# Patient Record
Sex: Female | Born: 1965 | Race: Black or African American | Hispanic: No | Smoking: Never smoker
Health system: Southern US, Community
[De-identification: ages and names within clinical notes are randomized; demographics above are authoritative.]

## PROBLEM LIST (undated history)

## (undated) DIAGNOSIS — T7840XA Allergy, unspecified, initial encounter: Secondary | ICD-10-CM

## (undated) DIAGNOSIS — R112 Nausea with vomiting, unspecified: Secondary | ICD-10-CM

## (undated) DIAGNOSIS — G43909 Migraine, unspecified, not intractable, without status migrainosus: Secondary | ICD-10-CM

## (undated) DIAGNOSIS — Z9889 Other specified postprocedural states: Secondary | ICD-10-CM

## (undated) DIAGNOSIS — D573 Sickle-cell trait: Secondary | ICD-10-CM

## (undated) DIAGNOSIS — K449 Diaphragmatic hernia without obstruction or gangrene: Secondary | ICD-10-CM

## (undated) DIAGNOSIS — K219 Gastro-esophageal reflux disease without esophagitis: Secondary | ICD-10-CM

## (undated) DIAGNOSIS — D649 Anemia, unspecified: Secondary | ICD-10-CM

## (undated) DIAGNOSIS — IMO0002 Reserved for concepts with insufficient information to code with codable children: Secondary | ICD-10-CM

## (undated) HISTORY — PX: SMALL INTESTINE SURGERY: SHX150

## (undated) HISTORY — PX: HERNIA REPAIR: SHX51

## (undated) HISTORY — DX: Reserved for concepts with insufficient information to code with codable children: IMO0002

## (undated) HISTORY — DX: Allergy, unspecified, initial encounter: T78.40XA

## (undated) HISTORY — DX: Migraine, unspecified, not intractable, without status migrainosus: G43.909

## (undated) HISTORY — DX: Diaphragmatic hernia without obstruction or gangrene: K44.9

## (undated) HISTORY — PX: COLONOSCOPY: SHX174

## (undated) HISTORY — DX: Anemia, unspecified: D64.9

## (undated) HISTORY — PX: UPPER GI ENDOSCOPY: SHX6162

## (undated) HISTORY — PX: SALPINGECTOMY: SHX328

---

## 1995-06-26 HISTORY — PX: CHOLECYSTECTOMY: SHX55

## 1997-08-18 ENCOUNTER — Ambulatory Visit (HOSPITAL_COMMUNITY): Admission: RE | Admit: 1997-08-18 | Discharge: 1997-08-18 | Payer: Self-pay | Admitting: Obstetrics and Gynecology

## 1997-09-21 ENCOUNTER — Inpatient Hospital Stay (HOSPITAL_COMMUNITY): Admission: AD | Admit: 1997-09-21 | Discharge: 1997-09-24 | Payer: Self-pay | Admitting: Obstetrics and Gynecology

## 1997-10-08 ENCOUNTER — Inpatient Hospital Stay (HOSPITAL_COMMUNITY): Admission: AD | Admit: 1997-10-08 | Discharge: 1997-10-11 | Payer: Self-pay

## 1997-10-12 ENCOUNTER — Encounter (HOSPITAL_COMMUNITY): Admission: RE | Admit: 1997-10-12 | Discharge: 1998-01-10 | Payer: Self-pay

## 1999-02-01 ENCOUNTER — Ambulatory Visit (HOSPITAL_COMMUNITY): Admission: AD | Admit: 1999-02-01 | Discharge: 1999-02-01 | Payer: Self-pay | Admitting: *Deleted

## 1999-02-01 ENCOUNTER — Encounter: Payer: Self-pay | Admitting: *Deleted

## 1999-02-01 ENCOUNTER — Encounter (INDEPENDENT_AMBULATORY_CARE_PROVIDER_SITE_OTHER): Payer: Self-pay | Admitting: Specialist

## 1999-06-26 HISTORY — PX: KNEE SURGERY: SHX244

## 1999-07-28 ENCOUNTER — Emergency Department (HOSPITAL_COMMUNITY): Admission: EM | Admit: 1999-07-28 | Discharge: 1999-07-28 | Payer: Self-pay | Admitting: *Deleted

## 2000-04-03 ENCOUNTER — Other Ambulatory Visit: Admission: RE | Admit: 2000-04-03 | Discharge: 2000-04-03 | Payer: Self-pay | Admitting: Obstetrics & Gynecology

## 2000-06-25 HISTORY — PX: TUBAL LIGATION: SHX77

## 2000-08-20 ENCOUNTER — Ambulatory Visit (HOSPITAL_BASED_OUTPATIENT_CLINIC_OR_DEPARTMENT_OTHER): Admission: RE | Admit: 2000-08-20 | Discharge: 2000-08-20 | Payer: Self-pay | Admitting: Orthopaedic Surgery

## 2001-11-28 ENCOUNTER — Encounter: Payer: Self-pay | Admitting: Obstetrics and Gynecology

## 2001-11-28 ENCOUNTER — Ambulatory Visit (HOSPITAL_COMMUNITY): Admission: RE | Admit: 2001-11-28 | Discharge: 2001-11-28 | Payer: Self-pay | Admitting: Obstetrics and Gynecology

## 2002-06-08 ENCOUNTER — Encounter: Payer: Self-pay | Admitting: Emergency Medicine

## 2002-06-08 ENCOUNTER — Emergency Department (HOSPITAL_COMMUNITY): Admission: EM | Admit: 2002-06-08 | Discharge: 2002-06-08 | Payer: Self-pay | Admitting: Emergency Medicine

## 2003-11-03 ENCOUNTER — Other Ambulatory Visit: Admission: RE | Admit: 2003-11-03 | Discharge: 2003-11-03 | Payer: Self-pay | Admitting: Obstetrics and Gynecology

## 2004-06-25 HISTORY — PX: HYSTEROSCOPY: SHX211

## 2005-10-09 ENCOUNTER — Other Ambulatory Visit: Admission: RE | Admit: 2005-10-09 | Discharge: 2005-10-09 | Payer: Self-pay | Admitting: Obstetrics and Gynecology

## 2005-10-12 ENCOUNTER — Ambulatory Visit (HOSPITAL_COMMUNITY): Admission: RE | Admit: 2005-10-12 | Discharge: 2005-10-12 | Payer: Self-pay | Admitting: Obstetrics and Gynecology

## 2006-01-11 ENCOUNTER — Encounter (INDEPENDENT_AMBULATORY_CARE_PROVIDER_SITE_OTHER): Payer: Self-pay | Admitting: Specialist

## 2006-01-11 ENCOUNTER — Ambulatory Visit (HOSPITAL_COMMUNITY): Admission: RE | Admit: 2006-01-11 | Discharge: 2006-01-11 | Payer: Self-pay | Admitting: Obstetrics and Gynecology

## 2008-06-25 HISTORY — PX: MYOMECTOMY: SHX85

## 2008-08-06 ENCOUNTER — Emergency Department (HOSPITAL_COMMUNITY): Admission: EM | Admit: 2008-08-06 | Discharge: 2008-08-06 | Payer: Self-pay | Admitting: Emergency Medicine

## 2009-01-06 ENCOUNTER — Emergency Department (HOSPITAL_BASED_OUTPATIENT_CLINIC_OR_DEPARTMENT_OTHER): Admission: EM | Admit: 2009-01-06 | Discharge: 2009-01-07 | Payer: Self-pay | Admitting: Emergency Medicine

## 2009-06-14 ENCOUNTER — Inpatient Hospital Stay (HOSPITAL_COMMUNITY): Admission: RE | Admit: 2009-06-14 | Discharge: 2009-06-17 | Payer: Self-pay | Admitting: Obstetrics and Gynecology

## 2009-06-14 ENCOUNTER — Encounter (INDEPENDENT_AMBULATORY_CARE_PROVIDER_SITE_OTHER): Payer: Self-pay | Admitting: Obstetrics and Gynecology

## 2010-09-25 LAB — CBC
HCT: 28.4 % — ABNORMAL LOW (ref 36.0–46.0)
HCT: 43.4 % (ref 36.0–46.0)
Hemoglobin: 13.2 g/dL (ref 12.0–15.0)
Hemoglobin: 9 g/dL — ABNORMAL LOW (ref 12.0–15.0)
MCHC: 30.4 g/dL (ref 30.0–36.0)
MCHC: 31.8 g/dL (ref 30.0–36.0)
MCV: 67.8 fL — ABNORMAL LOW (ref 78.0–100.0)
Platelets: 252 10*3/uL (ref 150–400)
Platelets: 273 10*3/uL (ref 150–400)
RBC: 6.4 MIL/uL — ABNORMAL HIGH (ref 3.87–5.11)
RDW: 20 % — ABNORMAL HIGH (ref 11.5–15.5)
RDW: 20 % — ABNORMAL HIGH (ref 11.5–15.5)
WBC: 5.7 10*3/uL (ref 4.0–10.5)

## 2010-09-25 LAB — COMPREHENSIVE METABOLIC PANEL
ALT: 24 U/L (ref 0–35)
AST: 26 U/L (ref 0–37)
Alkaline Phosphatase: 128 U/L — ABNORMAL HIGH (ref 39–117)
CO2: 23 mEq/L (ref 19–32)
Calcium: 9.8 mg/dL (ref 8.4–10.5)
GFR calc Af Amer: >60 mL/min (ref >60)
GFR calc non Af Amer: 53 mL/min — ABNORMAL LOW (ref >60)
Glucose, Bld: 77 mg/dL (ref 70–99)
Potassium: 3.9 mEq/L (ref 3.5–5.1)
Sodium: 137 mEq/L (ref 135–145)
Total Protein: 9.6 g/dL — ABNORMAL HIGH (ref 6.0–8.3)

## 2010-10-01 LAB — DIFFERENTIAL
Basophils Absolute: 0.2 K/uL — ABNORMAL HIGH (ref 0.0–0.1)
Basophils Relative: 3 % — ABNORMAL HIGH (ref 0–1)
Eosinophils Absolute: 0.1 K/uL (ref 0.0–0.7)
Eosinophils Relative: 2 % (ref 0–5)
Lymphocytes Relative: 33 % (ref 12–46)
Lymphs Abs: 2.4 10*3/uL (ref 0.7–4.0)
Monocytes Absolute: 0.4 K/uL (ref 0.1–1.0)
Monocytes Relative: 6 % (ref 3–12)
Neutro Abs: 4.2 10*3/uL (ref 1.7–7.7)
Neutrophils Relative %: 56 % (ref 43–77)

## 2010-10-01 LAB — COMPREHENSIVE METABOLIC PANEL WITH GFR
Alkaline Phosphatase: 104 U/L (ref 39–117)
BUN: 10 mg/dL (ref 6–23)
CO2: 24 meq/L (ref 19–32)
Chloride: 110 meq/L (ref 96–112)
Creatinine, Ser: 1 mg/dL (ref 0.4–1.2)
GFR calc non Af Amer: >60 mL/min (ref >60)
Glucose, Bld: 89 mg/dL (ref 70–99)
Total Bilirubin: 0.5 mg/dL (ref 0.3–1.2)

## 2010-10-01 LAB — COMPREHENSIVE METABOLIC PANEL
ALT: 7 U/L (ref 0–35)
AST: 25 U/L (ref 0–37)
Albumin: 3 g/dL — ABNORMAL LOW (ref 3.5–5.2)
Calcium: 8.1 mg/dL — ABNORMAL LOW (ref 8.4–10.5)
GFR calc Af Amer: >60 mL/min (ref >60)
Potassium: 4 mEq/L (ref 3.5–5.1)
Sodium: 138 mEq/L (ref 135–145)
Total Protein: 6.2 g/dL (ref 6.0–8.3)

## 2010-10-01 LAB — CBC
HCT: 24.8 % — ABNORMAL LOW (ref 36.0–46.0)
Hemoglobin: 7.6 g/dL — CL (ref 12.0–15.0)
MCHC: 30.8 g/dL (ref 30.0–36.0)
MCV: 57.9 fL — ABNORMAL LOW (ref 78.0–100.0)
Platelets: 258 K/uL (ref 150–400)
RBC: 4.28 MIL/uL (ref 3.87–5.11)
RDW: 20.8 % — ABNORMAL HIGH (ref 11.5–15.5)
WBC: 7.3 K/uL (ref 4.0–10.5)

## 2010-10-01 LAB — LIPASE, BLOOD: Lipase: 35 U/L (ref 23–300)

## 2010-10-02 LAB — POCT CARDIAC MARKERS
CKMB, poc: <1 ng/mL — ABNORMAL LOW (ref 1.0–8.0)
Myoglobin, poc: 57.4 ng/mL (ref 12–200)
Troponin i, poc: <0.05 ng/mL (ref 0.00–0.09)

## 2010-10-10 LAB — POCT CARDIAC MARKERS: Myoglobin, poc: 105 ng/mL (ref 12–200)

## 2010-10-10 LAB — CBC
Hemoglobin: 8.8 g/dL — ABNORMAL LOW (ref 12.0–15.0)
Platelets: 316 10*3/uL (ref 150–400)
RDW: 21.9 % — ABNORMAL HIGH (ref 11.5–15.5)

## 2010-10-10 LAB — BASIC METABOLIC PANEL
Calcium: 8.8 mg/dL (ref 8.4–10.5)
GFR calc non Af Amer: 50 mL/min — ABNORMAL LOW (ref >60)
Glucose, Bld: 95 mg/dL (ref 70–99)
Sodium: 131 mEq/L — ABNORMAL LOW (ref 135–145)

## 2010-11-10 NOTE — Op Note (Signed)
[facility]  Patient:    Michelle Molina, Michelle Molina                         MRN: [id number] Proc. Date: 08/20/00 Adm. Date:  [id number] Attending:  Marcene Corning                           Operative Report  PREOPERATIVE DIAGNOSIS:  Left knee chondromalacia patella.  POSTOPERATIVE DIAGNOSIS:  Left knee chondromalacia patella.  OPERATION PERFORMED: 1. Left knee chondromalacia patella. 2. Left knee arthroscopic lateral release.  ANESTHESIA:  Knee block.  ATTENDING SURGEON:  Lubertha Basque. Jerl Santos, M.D.  ASSISTANT:  None.  INDICATIONS FOR PROCEDURE:  The patient is a 45 year old woman with several months of intense left knee pain.  This has continued despite oral anti-inflammatories and injection with cortisone which did afford her transient relief.  She has undergone a preoperative MRI scan which showed significant chondromalacia patella and patellofemoral tilt.  At this point she was offered operative intervention to consist of an arthroscopy.  The procedure was discussed with the patient and informed operative consent was obtained after discussion of possible complications of reaction to anesthesia and infection.  DESCRIPTION OF PROCEDURE:  The patient was taken to an operating suite where knee block anesthetic was applied without difficulty.  She was then positioned supine and prepped and draped in normal sterile fashion.  After administration of preop intravenous antibiotics, an arthroscopy of the left knee was performed through a total of three portals.  The suprapatellar pouch was benign while the patellofemoral joint exhibited some grade 3 change across both surfaces but predominantly across the intertrochlear groove.  A thorough chondroplasty was required of most of this joint.  She had significant patellofemoral tilt and through an additional third portal, a lateral release was performed.  Care was taken to cauterize bleeding vessels.  After this  was performed, the knee cap tracked in a better position but certainly not perfect.  The medial and lateral compartments exhibited no evidence of meniscal or articular cartilage injury.  The anterior cruciate ligament and posterior cruciate ligament were intact.  The knee joint was thoroughly irrigated at the end of the case followed by placement of Marcaine with epinephrine and morphine.  Adaptic was placed over her portal sites, followed by dry gauze and loose Ace wrap.  Estimated blood loss and intraoperative fluids can be obtained from Anesthesia records.  DISPOSITION:  The patient was taken to the recovery room in stable condition. Plans were for her to go home the same day and to follow up in the office in less than a week.  I will contact her by phone tonight. DD:  08/20/00 TD:  08/20/00 Job: [id number]

## 2010-11-10 NOTE — Op Note (Signed)
Michelle Molina, Michelle Molina                  ACCOUNT NO.:  [id number]   MEDICAL RECORD NO.:  [id number]          PATIENT TYPE:  AMB   LOCATION:  SDC                           FACILITY:  [facility]   PHYSICIAN:  Osborn Coho, M.D.   DATE OF BIRTH:  08-15-1965   DATE OF PROCEDURE:  01/11/2006  DATE OF DISCHARGE:                                 OPERATIVE REPORT   PREOPERATIVE DIAGNOSES:  1.  Menorrhagia  2.  Dysmenorrhea.  3.  Polyp versus submucosal fibroid.   POSTOPERATIVE DIAGNOSES:  1.  Menorrhagia  2.  Dysmenorrhea.  3.  Polyp versus submucosal fibroid.   PROCEDURE:  1.  Hysteroscopy.  2.  Dilation and curettage.  3.  Resection of fibroid.   ATTENDING DOCTOR:  Osborn Coho, M.D.   ANESTHESIA:  General via LMA.   SPECIMENS TO PATHOLOGY:  Endometrial curettings and portion of resected  fibroids.   FLUIDS:  1400 mL.   ESTIMATED BLOOD LOSS:  Minimal.   URINE OUTPUT:  Sufficient via straight cath prior to procedure.  Hysteroscopic fluid deficit of sorbitol 350 mL.   COMPLICATIONS:  None.   FINDINGS:  Anterior wall fibroid and polypoid appearance of the endometrium.   PROCEDURE:  The patient was taken to the operating room after risks,  benefits, and alternatives reviewed with the patient, and the patient  verbalized understanding and consent signed and witnessed.  The patient was  placed under general per anesthesia and prepped and draped in the normal  sterile fashion.  A bivalve speculum placed in the patient's vagina while  the patient was in the dorsolithotomy position and a paracervical block  administered using a total of 10 mL of 1% lidocaine.  The anterior lip of  the cervix was grasped with the single-tooth tenaculum and the cervix  dilated for passage of the diagnostic hysteroscope.  Diagnostic hysteroscope  introduced and lesion noted on the anterior wall of the uterus.  Curettage  performed and lesion still present.  The cervix was dilated then for passage  of the  resectoscope.  Resectoscope was introduced, and the lesion on the  anterior wall was resected.  Pathology specimens sent.  Curettage performed  once again to remove any remaining debris.  The tenaculum was removed,  and there was bleeding at both tenaculum sites which were stitched with 0  chromic with good hemostasis.  A count was correct.  The patient tolerated  the procedure well and is awaiting transfer to the recovery room in good  condition.      Osborn Coho, M.D.  Electronically Signed     AR/MEDQ  D:  01/11/2006  T:  01/11/2006  Job:  [id number]

## 2010-11-22 ENCOUNTER — Ambulatory Visit: Payer: Self-pay | Admitting: Gastroenterology

## 2011-06-26 HISTORY — PX: GASTRIC BYPASS: SHX52

## 2011-08-24 ENCOUNTER — Emergency Department (HOSPITAL_COMMUNITY)
Admission: EM | Admit: 2011-08-24 | Discharge: 2011-08-24 | Disposition: A | Discharge status: Home / Self Care | Payer: 59 | Attending: Emergency Medicine | Admitting: Emergency Medicine

## 2011-08-24 ENCOUNTER — Emergency Department (HOSPITAL_COMMUNITY): Payer: 59

## 2011-08-24 ENCOUNTER — Encounter (HOSPITAL_COMMUNITY): Payer: Self-pay

## 2011-08-24 ENCOUNTER — Other Ambulatory Visit: Payer: Self-pay

## 2011-08-24 DIAGNOSIS — R071 Chest pain on breathing: Secondary | ICD-10-CM | POA: Insufficient documentation

## 2011-08-24 DIAGNOSIS — R0789 Other chest pain: Secondary | ICD-10-CM

## 2011-08-24 DIAGNOSIS — R0602 Shortness of breath: Secondary | ICD-10-CM | POA: Insufficient documentation

## 2011-08-24 DIAGNOSIS — R110 Nausea: Secondary | ICD-10-CM | POA: Insufficient documentation

## 2011-08-24 LAB — LIPASE, BLOOD: Lipase: 15 U/L (ref 11–59)

## 2011-08-24 LAB — DIFFERENTIAL
Basophils Relative: 0 % (ref 0–1)
Eosinophils Relative: 1 % (ref 0–5)
Lymphocytes Relative: 39 % (ref 12–46)
Neutrophils Relative %: 54 % (ref 43–77)

## 2011-08-24 LAB — CBC
Hemoglobin: 11.6 g/dL — ABNORMAL LOW (ref 12.0–15.0)
RBC: 5.22 MIL/uL — ABNORMAL HIGH (ref 3.87–5.11)
WBC: 7.6 10*3/uL (ref 4.0–10.5)

## 2011-08-24 LAB — COMPREHENSIVE METABOLIC PANEL
BUN: 14 mg/dL (ref 6–23)
Calcium: 9.6 mg/dL (ref 8.4–10.5)
GFR calc Af Amer: 72 mL/min — ABNORMAL LOW (ref >90)
Glucose, Bld: 90 mg/dL (ref 70–99)
Sodium: 137 mEq/L (ref 135–145)
Total Protein: 7.6 g/dL (ref 6.0–8.3)

## 2011-08-24 LAB — CARDIAC PANEL(CRET KIN+CKTOT+MB+TROPI)
CK, MB: 1.5 ng/mL (ref 0.3–4.0)
Troponin I: <0.3 ng/mL (ref <0.30)

## 2011-08-24 MED ORDER — TRAMADOL HCL 50 MG PO TABS: 50.0000 mg | ORAL_TABLET | Freq: Three times a day (TID) | ORAL | Status: AC | PRN

## 2011-08-24 MED ORDER — TRAMADOL HCL 50 MG PO TABS
50.0000 mg | ORAL_TABLET | Freq: Once | ORAL | Status: AC
  Administered 2011-08-24: 50 mg via ORAL
  Filled 2011-08-24: qty 1

## 2011-08-24 NOTE — ED Provider Notes (Signed)
Medical screening examination/treatment/procedure(s) were performed by non-physician practitioner and as supervising physician I was immediately available for consultation/collaboration.  Juliet Rude. Rubin Payor, MD 08/24/11 [phone]

## 2011-08-24 NOTE — ED Provider Notes (Signed)
History     CSN: [id number]  Arrival date & time 08/24/11  1602   First MD Initiated Contact with Patient 08/24/11 2002      Chief Complaint  Patient presents with  . Chest Pain    LT side thru to back.  LT arm weakness.  . Nausea    yesterday, none today  . Shortness of Breath    yesterday, a little now " a little winded"    (Consider location/radiation/quality/duration/timing/severity/associated sxs/prior treatment) HPI Comments: Patient was left chest pain that is reproducible.  [name] it has been [place] she's been drinking soda, which has not given her any relief.  She is taking no over-the-counter medication for pain.  He denies shortness of breath, diaphoresis, but has some associated nausea  The history is provided by the patient.    No past medical history on file.  Past Surgical History  Procedure Date  . Cesarean section     x2  . Tubal ligation   . Cholecystectomy   . Myomectomy   . Knee surgery     left  . Hysteroscopy     No family history on file.  History  Substance Use Topics  . Smoking status: Never Smoker   . Smokeless tobacco: Not on file  . Alcohol Use: Yes     occasionally    OB History    Grav Para Term Preterm Abortions TAB SAB Ect Mult Living                  Review of Systems  Constitutional: Negative for fever and chills.  HENT: Negative for congestion and rhinorrhea.   Respiratory: Negative for cough and shortness of breath.   Cardiovascular: Positive for chest pain. Negative for leg swelling.  Gastrointestinal: Positive for nausea. Negative for constipation.  Genitourinary: Negative for dysuria and urgency.  Musculoskeletal: Negative for joint swelling.  Neurological: Negative for dizziness and weakness.    Allergies  Aspirin and Percocet  Home Medications   Current Outpatient Rx  Name Route Sig Dispense Refill  . BILBERRY EXTRACT PO Oral Take 1 tablet by mouth daily.    . OMEGA-3 FATTY ACIDS  1000 MG PO CAPS Oral Take 1 g by mouth daily.    . ADULT MULTIVITAMIN W/MINERALS CH Oral Take 1 tablet by mouth daily.    Marland Kitchen VITAMIN C 500 MG PO TABS Oral Take 500 mg by mouth daily.      BP 141/86  Pulse 72  Temp(Src) 98.9 F (37.2 C) (Oral)  Resp 16  SpO2 99%  LMP 08/10/2011  Physical Exam  Constitutional: She is oriented to person, place, and time. She appears well-developed and well-nourished.  HENT:  Head: Normocephalic.  Eyes: Pupils are equal, round, and reactive to light.  Neck: Normal range of motion.  Cardiovascular: Normal rate.   Pulmonary/Chest: Effort normal and breath sounds normal. She exhibits tenderness.       Left upper chest wall reproducible pain.  No rash, discoloration  Abdominal: Soft.  Genitourinary: Vagina normal.  Musculoskeletal:       Arms: Neurological: She is alert and oriented to person, place, and time.  Skin: Skin is warm and dry.    ED Course  Procedures (including critical care time)  Labs Reviewed  COMPREHENSIVE METABOLIC PANEL - Abnormal; Notable for the following:    Alkaline Phosphatase 124 (*)    GFR calc non Af Amer 62 (*)    GFR calc Af [name]  72 (*)    All other components within normal limits  CBC - Abnormal; Notable for the following:    RBC 5.22 (*)    Hemoglobin 11.6 (*)    HCT 35.3 (*)    MCV 67.6 (*)    MCH 22.2 (*)    RDW 18.1 (*)    All other components within normal limits  LIPASE, BLOOD  DIFFERENTIAL  CARDIAC PANEL(CRET KIN+CKTOT+MB+TROPI)   Dg Chest 2 View  08/24/2011  *RADIOLOGY REPORT*  Clinical Data: Short of breath, weakness  CHEST - 2 VIEW  Comparison: None.  Findings: Normal mediastinum and cardiac silhouette.  Normal pulmonary  vasculature.  No evidence of effusion, infiltrate, or pneumothorax.  No acute bony abnormality.  Cholecystectomy clips noted  IMPRESSION: No acute cardiopulmonary process.  Original Report Authenticated By: Genevive Bi, M.D.     1. Chest wall pain     ED ECG REPORT    Date: 08/24/2011  EKG Time: 10:37 PM  Rate: 73  Rhythm: normal sinus rhythm,  unchanged from previous tracings  Axis: normal  Intervals:none  ST&T Change: none  Narrative Interpretation: normal            MDM  Reproducible chest wall pain        Arman Filter, NP 08/24/11 2237

## 2011-08-24 NOTE — Discharge Instructions (Signed)
Chest Wall Pain Chest wall pain is pain in or around the bones and muscles of your chest. This may occur:   On its own (spontaneously).   After a viral illness such as the flu.   Through injur.   From coughing.   Minor exercise.  It may take up to 6 weeks to get better; longer if you must stay physically active in your work and activities. HOME CARE INSTRUCTIONS   Avoid over-tiring physical activity. Try not to strain or perform activities which cause pain. This would include any activities using chest, belly (abdominal) and side muscles, especially if heavy weights are used.   Use ice on the painful area for 15 to 20 minutes per hour while awake for the first 2 days. Place the ice in a plastic bag and place a towel between the bag of ice and your skin.   Only take over-the-counter or prescription medicines for pain, discomfort, or fever as directed by your caregiver.  SEEK IMMEDIATE MEDICAL CARE IF:   Your pain increases or you are very uncomfortable.   An oral temperature above 102 F (38.9 C)develops.   Your chest pains become worse.   You develop new, unexplained problems (symptoms).   You develop nausea, vomiting, sweating or feel light headed.   You develop a cough which produces phlegm (sputum) or you cough up blood.  MAKE SURE YOU:   Understand these instructions.   Will watch your condition.   Will get help right away if you are not doing well or get worse.  Document Released: 06/11/2005 Document Revised: 12/25/2010 Document Reviewed: 01/28/2008 [facility] Patient Information 2012 [place], [place]. Today, your cardiac evaluation is negative for cardiac reason for your chest wall pain.  Electrolytes, CBC, normal EKG is normal.  Chest x-ray is normal.  You've been prescribed Ultram for discomfort 2 to your allergies to aspirin and Percocet

## 2011-08-24 NOTE — ED Notes (Signed)
Pt c/o chest pain x3 days.  Went to UC today and was sent to [facility] due to "abnormal ekg".  C/o mild sob.  Pain 4/10.  States nausea yesterday.  No vomiting.  Constant, LT sided sharp chest pains.

## 2011-08-24 NOTE — ED Notes (Signed)
Pain to LT chest x 3 days w/pain going thru to back.  States nausea yesterday and shob yesterday and today-"feeling winded" but pink, warm, dry and in no distress in triage.  Sent by UC for "abnormal ekg".  Pt rates pain to LT chest currently at 5/10, sharp and through to back.

## 2011-11-28 ENCOUNTER — Ambulatory Visit (INDEPENDENT_AMBULATORY_CARE_PROVIDER_SITE_OTHER): Payer: 59 | Admitting: Psychiatry

## 2011-11-28 ENCOUNTER — Encounter (HOSPITAL_COMMUNITY): Payer: Self-pay | Admitting: Psychiatry

## 2011-11-28 VITALS — BP 128/97 | HR 69 | Wt 340.0 lb

## 2011-11-28 DIAGNOSIS — F329 Major depressive disorder, single episode, unspecified: Secondary | ICD-10-CM | POA: Insufficient documentation

## 2011-11-28 DIAGNOSIS — F4321 Adjustment disorder with depressed mood: Secondary | ICD-10-CM
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
## 2012-03-05 ENCOUNTER — Emergency Department: Payer: Self-pay | Admitting: Unknown Physician Specialty

## 2012-03-05 LAB — COMPREHENSIVE METABOLIC PANEL
Albumin: 3.3 g/dL — ABNORMAL LOW (ref 3.4–5.0)
Alkaline Phosphatase: 153 U/L — ABNORMAL HIGH (ref 50–136)
BUN: 9 mg/dL (ref 7–18)
Bilirubin,Total: 0.7 mg/dL (ref 0.2–1.0)
Calcium, Total: 9.1 mg/dL (ref 8.5–10.1)
Co2: 21 mmol/L (ref 21–32)
Creatinine: 0.86 mg/dL (ref 0.60–1.30)
EGFR (Non-African Amer.): >60
Osmolality: 280 (ref 275–301)
SGOT(AST): 40 U/L — ABNORMAL HIGH (ref 15–37)
SGPT (ALT): 29 U/L (ref 12–78)
Sodium: 141 mmol/L (ref 136–145)
Total Protein: 7.3 g/dL (ref 6.4–8.2)

## 2012-03-05 LAB — LIPASE, BLOOD: Lipase: 519 U/L — ABNORMAL HIGH (ref 73–393)

## 2012-03-05 LAB — MAGNESIUM: Magnesium: 1.8 mg/dL

## 2012-03-05 LAB — URINALYSIS, COMPLETE
Bacteria: NONE SEEN
Bilirubin,UR: NEGATIVE
Glucose,UR: NEGATIVE mg/dL (ref 0–75)
Ph: 5 (ref 4.5–8.0)
RBC,UR: 16 /HPF (ref 0–5)
Specific Gravity: 1.019 (ref 1.003–1.030)

## 2012-03-05 LAB — CBC
HCT: 39.5 % (ref 35.0–47.0)
MCHC: 32 g/dL (ref 32.0–36.0)
MCV: 73 fL — ABNORMAL LOW (ref 80–100)
Platelet: 190 10*3/uL (ref 150–440)
RDW: 20.5 % — ABNORMAL HIGH (ref 11.5–14.5)

## 2012-03-05 LAB — CK TOTAL AND CKMB (NOT AT ARMC)
CK, Total: 48 U/L (ref 21–215)
CK-MB: <0.5 ng/mL — ABNORMAL LOW (ref 0.5–3.6)

## 2012-06-24 ENCOUNTER — Ambulatory Visit: Payer: Self-pay | Admitting: Specialist

## 2012-06-24 LAB — CBC WITH DIFFERENTIAL/PLATELET
Basophil #: 0.1 10*3/uL (ref 0.0–0.1)
Eosinophil #: 0.1 10*3/uL (ref 0.0–0.7)
HCT: 38.6 % (ref 35.0–47.0)
Lymphocyte %: 36.6 %
MCH: 23.3 pg — ABNORMAL LOW (ref 26.0–34.0)
MCHC: 31.4 g/dL — ABNORMAL LOW (ref 32.0–36.0)
Monocyte #: 0.4 x10 3/mm (ref 0.2–0.9)
Monocyte %: 7.5 %
Neutrophil #: 2.5 10*3/uL (ref 1.4–6.5)
Neutrophil %: 53.1 %
RDW: 17.8 % — ABNORMAL HIGH (ref 11.5–14.5)

## 2012-06-24 LAB — COMPREHENSIVE METABOLIC PANEL
Anion Gap: 8 (ref 7–16)
BUN: 10 mg/dL (ref 7–18)
Calcium, Total: 9.3 mg/dL (ref 8.5–10.1)
Chloride: 110 mmol/L — ABNORMAL HIGH (ref 98–107)
Co2: 23 mmol/L (ref 21–32)
EGFR (African American): >60
EGFR (Non-African Amer.): >60
Glucose: 80 mg/dL (ref 65–99)
Osmolality: 279 (ref 275–301)
Potassium: 3.7 mmol/L (ref 3.5–5.1)
SGOT(AST): 21 U/L (ref 15–37)
SGPT (ALT): 18 U/L (ref 12–78)
Sodium: 141 mmol/L (ref 136–145)
Total Protein: 7.6 g/dL (ref 6.4–8.2)

## 2012-10-23 LAB — HM MAMMOGRAPHY

## 2012-10-23 LAB — HM PAP SMEAR: HM Pap smear: NORMAL

## 2012-12-08 ENCOUNTER — Encounter: Payer: Self-pay | Admitting: Internal Medicine

## 2012-12-08 ENCOUNTER — Ambulatory Visit (INDEPENDENT_AMBULATORY_CARE_PROVIDER_SITE_OTHER): Payer: 59 | Admitting: Internal Medicine

## 2012-12-08 ENCOUNTER — Other Ambulatory Visit (INDEPENDENT_AMBULATORY_CARE_PROVIDER_SITE_OTHER): Payer: 59

## 2012-12-08 VITALS — BP 120/88 | HR 62 | Temp 97.9°F | Resp 14 | Ht 66.0 in | Wt 220.0 lb

## 2012-12-08 DIAGNOSIS — Z1329 Encounter for screening for other suspected endocrine disorder: Secondary | ICD-10-CM

## 2012-12-08 DIAGNOSIS — Z130 Encounter for screening for diseases of the blood and blood-forming organs and certain disorders involving the immune mechanism: Secondary | ICD-10-CM

## 2012-12-08 DIAGNOSIS — Z131 Encounter for screening for diabetes mellitus: Secondary | ICD-10-CM

## 2012-12-08 DIAGNOSIS — Z13220 Encounter for screening for lipoid disorders: Secondary | ICD-10-CM

## 2012-12-08 DIAGNOSIS — Z0000 Encounter for general adult medical examination without abnormal findings: Secondary | ICD-10-CM

## 2012-12-08 LAB — COMPREHENSIVE METABOLIC PANEL
Albumin: 3.7 g/dL (ref 3.5–5.2)
CO2: 28 mEq/L (ref 19–32)
GFR: 86.48 mL/min (ref >60.00)
Glucose, Bld: 83 mg/dL (ref 70–99)
Sodium: 139 mEq/L (ref 135–145)
Total Bilirubin: 0.8 mg/dL (ref 0.3–1.2)
Total Protein: 7.3 g/dL (ref 6.0–8.3)

## 2012-12-08 LAB — LIPID PANEL
Cholesterol: 131 mg/dL (ref 0–200)
HDL: 48.7 mg/dL (ref >39.00)
LDL Cholesterol: 72 mg/dL (ref 0–99)
Triglycerides: 50 mg/dL (ref 0.0–149.0)

## 2012-12-08 LAB — CBC
MCV: 72.3 fl — ABNORMAL LOW (ref 78.0–100.0)
RBC: 4.93 Mil/uL (ref 3.87–5.11)
WBC: 5.4 10*3/uL (ref 4.5–10.5)

## 2012-12-08 NOTE — Assessment & Plan Note (Signed)
Continue to work on diet and exercise

## 2012-12-08 NOTE — Patient Instructions (Signed)

## 2012-12-08 NOTE — Progress Notes (Signed)
HPI  Pt presents to the clinic today to establish care. She is transferring care from [facility]. She has not been seen there in about 1 year. She has no concerns today.  Flu: never Tetanus: 8-10 years ago Eye Doctor: yearly Dentist :biannually LMP: 11/29/2012 Pap smear: 10/2012 Mammogram: 10/2012  Past Medical History  Diagnosis Date  . Ulcer     Current Outpatient Prescriptions  Medication Sig Dispense Refill  . Biotin 5000 MCG CAPS Take 1 capsule by mouth daily.      . Calcium 600-400 MG-UNIT CHEW Chew 1 tablet by mouth 2 (two) times daily.      . Cholecalciferol (VITAMIN D3) 2000 UNITS CHEW Chew 1 tablet by mouth 2 (two) times daily.      . fish oil-omega-3 fatty acids 1000 MG capsule Take 1 g by mouth 2 (two) times daily.       . Multiple Vitamin (MULITIVITAMIN WITH MINERALS) TABS Take 1 tablet by mouth 2 (two) times daily.       Marland Kitchen omeprazole (PRILOSEC) 20 MG capsule Take 20 mg by mouth 2 (two) times daily.      . Safflower Oil (CLA) 1000 MG CAPS Take 1 capsule by mouth 2 (two) times a week.      . sucralfate (CARAFATE) 1 GM/10ML suspension Take 1 g by mouth 4 (four) times daily.      Marland Kitchen thiamine 100 MG tablet Take 100 mg by mouth daily.       No current facility-administered medications for this visit.    Allergies  Allergen Reactions  . Aspirin Nausea Only  . Percocet (Oxycodone-Acetaminophen) Itching    Family History  Problem Relation Age of Onset  . Uterine cancer Mother   . Hypertension Mother   . Breast cancer Maternal Aunt   . Lung cancer Maternal Uncle     History   Social History  . Marital Status: Divorced    Spouse Name: N/A    Number of Children: 2  . Years of Education: 16   Occupational History  . [employer]   Social History Main Topics  . Smoking status: Never Smoker   . Smokeless tobacco: Never Used  . Alcohol Use: Yes     Comment: occasionally  . Drug Use: No  . Sexually Active: Yes    Birth  Control/ Protection: Condom   Other Topics Concern  . Not on file   Social History Narrative   Regular exercise-yes   Caffeine Use-no    ROS:  Constitutional: Denies fever, malaise, fatigue, headache or abrupt weight changes.  HEENT: Denies eye pain, eye redness, ear pain, ringing in the ears, wax buildup, runny nose, nasal congestion, bloody nose, or sore throat. Respiratory: Denies difficulty breathing, shortness of breath, cough or sputum production.   Cardiovascular: Denies chest pain, chest tightness, palpitations or swelling in the hands or feet.  Gastrointestinal: Denies abdominal pain, bloating, constipation, diarrhea or blood in the stool.  GU: Denies frequency, urgency, pain with urination, blood in urine, odor or discharge. Musculoskeletal: Denies decrease in range of motion, difficulty with gait, muscle pain or joint pain and swelling.  Skin: Denies redness, rashes, lesions or ulcercations.  Neurological: Denies dizziness, difficulty with memory, difficulty with speech or problems with balance and coordination.   No other specific complaints in a complete review of systems (except as listed in HPI above).  PE:  BP 120/88  Pulse 62  Temp(Src) 97.9 F (36.6 C) (Oral)  Resp  14  Ht 5\' 6"  (1.676 m)  Wt 220 lb (99.791 kg)  BMI 35.53 kg/m2  LMP 11/02/2012 Wt Readings from Last 3 Encounters:  12/08/12 220 lb (99.791 kg)  11/28/11 340 lb (154.223 kg)    General: Appears her stated age, obese but well developed, well nourished in NAD. HEENT: Head: normal shape and size; Eyes: sclera white, no icterus, conjunctiva pink, PERRLA and EOMs intact; Ears: Tm's gray and intact, normal light reflex; Nose: mucosa pink and moist, septum midline; Throat/Mouth: Teeth present, mucosa pink and moist, no lesions or ulcerations noted.  Neck: Normal range of motion. Neck supple, trachea midline. No massses, lumps or thyromegaly present.  Cardiovascular: Normal rate and rhythm. S1,S2 noted.   No murmur, rubs or gallops noted. No JVD or BLE edema. No carotid bruits noted. Pulmonary/Chest: Normal effort and positive vesicular breath sounds. No respiratory distress. No wheezes, rales or ronchi noted.  Abdomen: Soft and nontender. Normal bowel sounds, no bruits noted. No distention or masses noted. Liver, spleen and kidneys non palpable. Musculoskeletal: Normal range of motion. No signs of joint swelling. No difficulty with gait.  Neurological: Alert and oriented. Cranial nerves II-XII intact. Coordination normal. +DTRs bilaterally. Psychiatric: Mood and affect normal. Behavior is normal. Judgment and thought content normal.     BMET    Component Value Date/Time   NA 137 08/24/2011 2027   K 3.9 08/24/2011 2027   CL 103 08/24/2011 2027   CO2 26 08/24/2011 2027   GLUCOSE 90 08/24/2011 2027   BUN 14 08/24/2011 2027   CREATININE 1.07 08/24/2011 2027   CALCIUM 9.6 08/24/2011 2027   GFRNONAA 62* 08/24/2011 2027   GFRAA 72* 08/24/2011 2027    Lipid Panel  No results found for this basename: chol, trig, hdl, cholhdl, vldl, ldlcalc    CBC    Component Value Date/Time   WBC 7.6 08/24/2011 2027   RBC 5.22* 08/24/2011 2027   HGB 11.6* 08/24/2011 2027   HCT 35.3* 08/24/2011 2027   PLT 239 08/24/2011 2027   MCV 67.6* 08/24/2011 2027   MCH 22.2* 08/24/2011 2027   MCHC 32.9 08/24/2011 2027   RDW 18.1* 08/24/2011 2027   LYMPHSABS 3.0 08/24/2011 2027   MONOABS 0.5 08/24/2011 2027   EOSABS 0.1 08/24/2011 2027   BASOSABS 0.0 08/24/2011 2027    Hgb A1C No results found for this basename: HGBA1C     Assessment and Plan:  Preventative health maintenance:  Continue to work on diet and exercise Will obtain labs today  All HM UTD  RTC in 1 year or sooner if needed

## 2013-06-16 ENCOUNTER — Encounter: Payer: Self-pay | Admitting: Internal Medicine

## 2013-06-16 ENCOUNTER — Ambulatory Visit (INDEPENDENT_AMBULATORY_CARE_PROVIDER_SITE_OTHER): Payer: 59 | Admitting: Internal Medicine

## 2013-06-16 ENCOUNTER — Other Ambulatory Visit (INDEPENDENT_AMBULATORY_CARE_PROVIDER_SITE_OTHER): Payer: 59

## 2013-06-16 ENCOUNTER — Telehealth: Payer: Self-pay | Admitting: *Deleted

## 2013-06-16 VITALS — BP 130/90 | HR 60 | Temp 98.5°F | Resp 16 | Ht 66.0 in | Wt 185.0 lb

## 2013-06-16 DIAGNOSIS — D509 Iron deficiency anemia, unspecified: Secondary | ICD-10-CM

## 2013-06-16 DIAGNOSIS — R55 Syncope and collapse: Secondary | ICD-10-CM

## 2013-06-16 DIAGNOSIS — Z9884 Bariatric surgery status: Secondary | ICD-10-CM

## 2013-06-16 LAB — COMPREHENSIVE METABOLIC PANEL
AST: 17 U/L (ref 0–37)
Albumin: 4.3 g/dL (ref 3.5–5.2)
BUN: 11 mg/dL (ref 6–23)
Calcium: 9.3 mg/dL (ref 8.4–10.5)
Chloride: 106 mEq/L (ref 96–112)
Glucose, Bld: 91 mg/dL (ref 70–99)
Potassium: 4 mEq/L (ref 3.5–5.1)
Sodium: 138 mEq/L (ref 135–145)
Total Bilirubin: 1.1 mg/dL (ref 0.3–1.2)
Total Protein: 7.7 g/dL (ref 6.0–8.3)

## 2013-06-16 LAB — CBC WITH DIFFERENTIAL/PLATELET
Basophils Relative: 0.7 % (ref 0.0–3.0)
Eosinophils Absolute: 0.1 10*3/uL (ref 0.0–0.7)
Eosinophils Relative: 1.2 % (ref 0.0–5.0)
Hemoglobin: 11.3 g/dL — ABNORMAL LOW (ref 12.0–15.0)
Lymphocytes Relative: 44.3 % (ref 12.0–46.0)
MCHC: 32.4 g/dL (ref 30.0–36.0)
Neutro Abs: 2.4 10*3/uL (ref 1.4–7.7)
Neutrophils Relative %: 48.2 % (ref 43.0–77.0)
Platelets: 238 10*3/uL (ref 150.0–400.0)
RBC: 5.03 Mil/uL (ref 3.87–5.11)
WBC: 4.9 10*3/uL (ref 4.5–10.5)

## 2013-06-16 LAB — FOLATE: Folate: 12.6 ng/mL (ref >5.9)

## 2013-06-16 LAB — IBC PANEL: Transferrin: 299.9 mg/dL (ref 212.0–360.0)

## 2013-06-16 LAB — VITAMIN B12: Vitamin B-12: 295 pg/mL (ref 211–911)

## 2013-06-16 LAB — FERRITIN: Ferritin: 3.6 ng/mL — ABNORMAL LOW (ref 10.0–291.0)

## 2013-06-16 LAB — TSH: TSH: 1.49 u[IU]/mL (ref 0.35–5.50)

## 2013-06-16 NOTE — Progress Notes (Signed)
Pre visit review using our clinic review tool, if applicable. No additional management support is needed unless otherwise documented below in the visit note. 

## 2013-06-16 NOTE — Telephone Encounter (Signed)
[facility] Triage Call Report Triage Record Num: [id number] Operator: Alphonsa Overall Patient Name: Michelle Molina Call Date & Time: 06/14/2013 8:12:08PM Patient Phone: [phone] PCP: Nicki Reaper Patient Gender: Female PCP Fax : Patient DOB: 03-May-1966 Practice Name: [facility] Reason for Call: LMP 05/19/13. Caller: Cambelle/Patient; PCP: Nicki Reaper; CB#: [phone]; Call regarding Dizziness, vision changes. Onset 06/14/13 at 1900. Was watching ball game, jumped up and episode of dizziness. Tingling. Took minutes to feel she could move. Headache continues, still dizzy. See EDnow. Care advice given per Dizziness Protocol. Pt not sure if will go to ED tonight. Protocol(s) Used: Dizziness or Vertigo Recommended Outcome per Protocol: See Provider within 24 hours Override Outcome if Used in Protocol: See ED Immediately RN Reason for Override Outcome: Nursing Judgement Used. Reason for Outcome: Previously evaluated and worsening symptoms interfering with ability to carry out activities of daily living (ADLs) Care Advice: Call EMS 911 if patient develops confusion, decreased level of consciousness, chest pain, shortness of breath, or focal neurologic abnormalities such as facial droop or weakness of one extremity. ~ ~ DO NOT drive until condition evaluated. ~ Protect from falling or other injury. ~ Should not be alone, arrange for support (family member, friend, etc.). Avoid caffeine (coffee, tea, cola drinks, or chocolate), alcohol, and nicotine (use of tobacco), as use of these substances may worsen symptoms. ~ ~ Call provider immediately if have difficulty walking, vision problems, or weakness. ~ SYMPTOM / CONDITION MANAGEMENT ~ List, or take, all current prescription(s), nonprescription or alternative medication(s) to provider for evaluation. ~ Lie still in a dimly lit room and avoid any sudden change in position. A temporary drop in blood pressure sometimes occurs with a quick  change to an upright position (postural hypotension) and may cause light-headedness or dizziness. Change position slowly. Making a habit of rising slowly and sitting for a few minutes before standing to walk usually relieves the feeling of faintness. ~ When feeling faint, find a place to lie down if possible, and elevate legs 8 to 12 inches above the heart. If unable to lie down, sit in a chair and lower head between the knees for 3 to 5 minutes. If standing and not able to sit, cross legs and squeeze the knees together to move blood to the heart. ~ 12/

## 2013-06-16 NOTE — Patient Instructions (Signed)
Syncope  Syncope is a fainting spell. This means the person loses consciousness and drops to the ground. The person is generally unconscious for less than 5 minutes. The person may have some muscle twitches for up to 15 seconds before waking up and returning to normal. Syncope occurs more often in elderly people, but it can happen to anyone. While most causes of syncope are not dangerous, syncope can be a sign of a serious medical problem. It is important to seek medical care.   CAUSES   Syncope is caused by a sudden decrease in blood flow to the brain. The specific cause is often not determined. Factors that can trigger syncope include:   Taking medicines that lower blood pressure.   Sudden changes in posture, such as standing up suddenly.   Taking more medicine than prescribed.   Standing in one place for too long.   Seizure disorders.   Dehydration and excessive exposure to heat.   Low blood sugar (hypoglycemia).   Straining to have a bowel movement.   Heart disease, irregular heartbeat, or other circulatory problems.   Fear, emotional distress, seeing blood, or severe pain.  SYMPTOMS   Right before fainting, you may:   Feel dizzy or lightheaded.   Feel nauseous.   See all white or all black in your field of vision.   Have cold, clammy skin.  DIAGNOSIS   Your caregiver will ask about your symptoms, perform a physical exam, and perform electrocardiography (ECG) to record the electrical activity of your heart. Your caregiver may also perform other heart or blood tests to determine the cause of your syncope.  TREATMENT   In most cases, no treatment is needed. Depending on the cause of your syncope, your caregiver may recommend changing or stopping some of your medicines.  HOME CARE INSTRUCTIONS   Have someone stay with you until you feel stable.   Do not drive, operate machinery, or play sports until your caregiver says it is okay.   Keep all follow-up appointments as directed by your  caregiver.   Lie down right away if you start feeling like you might faint. Breathe deeply and steadily. Wait until all the symptoms have passed.   Drink enough fluids to keep your urine clear or pale yellow.   If you are taking blood pressure or heart medicine, get up slowly, taking several minutes to sit and then stand. This can reduce dizziness.  SEEK IMMEDIATE MEDICAL CARE IF:    You have a severe headache.   You have unusual pain in the chest, abdomen, or back.   You are bleeding from the mouth or rectum, or you have black or tarry stool.   You have an irregular or very fast heartbeat.   You have pain with breathing.   You have repeated fainting or seizure-like jerking during an episode.   You faint when sitting or lying down.   You have confusion.   You have difficulty walking.   You have severe weakness.   You have vision problems.  If you fainted, call your local emergency services (911 in [place] Do not drive yourself to the hospital.   MAKE SURE YOU:   Understand these instructions.   Will watch your condition.   Will get help right away if you are not doing well or get worse.  Document Released: 06/11/2005 Document Revised: 12/11/2011 Document Reviewed: 08/10/2011  ExitCare Patient Information 2014 [employer].

## 2013-06-16 NOTE — Progress Notes (Signed)
Subjective:    Patient ID: Michelle Molina, female    DOB: 1966-01-04, 47 y.o.   MRN: [id number]  HPI  New to me she complains that 2 days ago she was at work, sitting down watching a football game and her team scored a touchdown so she jumped up quickly, felt dizzy then had a brief syncopal episode. She was down for 1-2 minutes and continued to have some dizziness but otherwise she felt well afterwards. She denies any chest pain, SOB, DOE, edema, HA, nausea, vomiting, or paresthesias.  Review of Systems  Constitutional: Negative.  Negative for fever, chills, diaphoresis, appetite change and fatigue.  HENT: Negative.   Eyes: Negative.  Negative for visual disturbance.  Respiratory: Negative.  Negative for cough, choking, chest tightness, shortness of breath, wheezing and stridor.   Cardiovascular: Negative.  Negative for chest pain, palpitations and leg swelling.  Gastrointestinal: Negative.  Negative for nausea, vomiting, abdominal pain, diarrhea, constipation and blood in stool.  Endocrine: Negative.   Genitourinary: Negative.  Negative for hematuria, vaginal bleeding and menstrual problem.  Musculoskeletal: Negative.   Skin: Negative.   Allergic/Immunologic: Negative.   Neurological: Positive for dizziness and syncope. Negative for tremors, seizures, facial asymmetry, speech difficulty, weakness, light-headedness, numbness and headaches.  Hematological: Negative.  Negative for adenopathy. Does not bruise/bleed easily.  Psychiatric/Behavioral: Negative.        Objective:   Physical Exam  Vitals reviewed. Constitutional: She is oriented to person, place, and time. She appears well-developed and well-nourished. No distress.  HENT:  Head: Normocephalic and atraumatic.  Mouth/Throat: Oropharynx is clear and moist. No oropharyngeal exudate.  Eyes: Conjunctivae are normal. Right eye exhibits no discharge. Left eye exhibits no discharge. No scleral icterus.  Neck: Normal range of motion.  Neck supple. No JVD present. No tracheal deviation present. No thyromegaly present.  Cardiovascular: Normal rate, regular rhythm, normal heart sounds and intact distal pulses.  Exam reveals no gallop and no friction rub.   No murmur heard. Pulmonary/Chest: Effort normal and breath sounds normal. No stridor. No respiratory distress. She has no wheezes. She has no rales. She exhibits no tenderness.  Abdominal: Soft. Bowel sounds are normal. She exhibits no distension. There is no tenderness. There is no rebound and no guarding.  Musculoskeletal: Normal range of motion. She exhibits no edema and no tenderness.  Lymphadenopathy:    She has no cervical adenopathy.  Neurological: She is alert and oriented to person, place, and time. She has normal strength. She displays no atrophy and no tremor. No cranial nerve deficit or sensory deficit. She exhibits normal muscle tone. She displays a negative Romberg sign. She displays no seizure activity. Coordination and gait normal. She displays no Babinski's sign on the right side. She displays no Babinski's sign on the left side.  Reflex Scores:      Tricep reflexes are 1+ on the right side and 1+ on the left side.      Bicep reflexes are 1+ on the right side and 1+ on the left side.      Brachioradialis reflexes are 1+ on the right side and 1+ on the left side.      Patellar reflexes are 1+ on the right side and 1+ on the left side.      Achilles reflexes are 1+ on the right side and 1+ on the left side. Skin: Skin is warm and dry. No rash noted. She is not diaphoretic. No erythema. No pallor.  Psychiatric: She has a normal  mood and affect. Her behavior is normal. Judgment and thought content normal.     Lab Results  Component Value Date   WBC 5.4 12/08/2012   HGB 11.4* 12/08/2012   HCT 35.6* 12/08/2012   PLT 265.0 12/08/2012   GLUCOSE 83 12/08/2012   CHOL 131 12/08/2012   TRIG 50.0 12/08/2012   HDL 48.70 12/08/2012   LDLCALC 72 12/08/2012   ALT 19 12/08/2012    AST 23 12/08/2012   NA 139 12/08/2012   K 3.9 12/08/2012   CL 106 12/08/2012   CREATININE 0.9 12/08/2012   BUN 10 12/08/2012   CO2 28 12/08/2012   TSH 1.92 12/08/2012   HGBA1C 5.3 12/08/2012       Assessment & Plan:

## 2013-06-17 ENCOUNTER — Encounter: Payer: Self-pay | Admitting: Internal Medicine

## 2013-06-17 DIAGNOSIS — D509 Iron deficiency anemia, unspecified: Secondary | ICD-10-CM | POA: Insufficient documentation

## 2013-06-17 LAB — VITAMIN D 25 HYDROXY (VIT D DEFICIENCY, FRACTURES): Vit D, 25-Hydroxy: 37 ng/mL (ref 30–89)

## 2013-06-17 MED ORDER — FERRALET 90 90-1 MG PO TABS: 1.0000 | ORAL_TABLET | Freq: Every day | ORAL | Status: DC

## 2013-06-17 NOTE — Assessment & Plan Note (Signed)
She has two risks factors for this - s/p gastric bypass and menstruation I have asked her to start ferralet to treat this

## 2013-06-17 NOTE — Assessment & Plan Note (Signed)
I will check her labs to look for vitamin deficiencies and complications 

## 2013-06-17 NOTE — Assessment & Plan Note (Addendum)
Her EKG shows TWI in V1-V3, this was present on an EKG down three years ago and is unchanged I think she had a vasovagal/orthostatic episode and today she appears to have recovered with no complications I will check her labs to look for secondary causes

## 2013-06-21 LAB — VITAMIN B1: Vitamin B1 (Thiamine): 8 nmol/L (ref 8–30)

## 2013-06-22 ENCOUNTER — Encounter: Payer: Self-pay | Admitting: Internal Medicine

## 2013-06-22 LAB — VITAMIN B6: Vitamin B6: 15.3 ng/mL (ref 2.1–21.7)

## 2013-06-29 ENCOUNTER — Other Ambulatory Visit: Payer: Self-pay | Admitting: Internal Medicine

## 2013-06-29 DIAGNOSIS — D509 Iron deficiency anemia, unspecified: Secondary | ICD-10-CM

## 2013-06-29 MED ORDER — FERROUS SULFATE 325 (65 FE) MG PO TABS: 325.0000 mg | ORAL_TABLET | Freq: Every day | ORAL | Status: DC

## 2013-06-29 MED ORDER — FERROUS SULFATE 325 (65 FE) MG PO TABS
325.0000 mg | ORAL_TABLET | Freq: Every day | ORAL | Status: DC
Start: 2013-06-29 — End: 2013-06-29

## 2013-07-28 IMAGING — CT CT CHEST W/ CM
1 series · 16 of 33 positions shown, 20 images · non-contrast
Comparison: None

REASON FOR EXAM: cp
COMMENTS:

PROCEDURE:     CT  - CT CHEST (FOR PE) W  - March 05, 2012  [DATE]
RESULT:     Indications: Chest Pain
TECHNIQUE: A thin-section spiral CT from the lung apices to the upper
abdomen was acquired on a multi slice scanner following 100ml [id number]
intravenous contrast. These images were then transferred to the Siemens work
station and were subsequently reviewed utilizing 3-D reconstructions and MIP
images.

[Series 4: soft tissue · axial · 0.69mm/px · z∈[-650,-374]mm · 16 of 100 slices shown, 20 images]
[im 4/100  mediastinal]
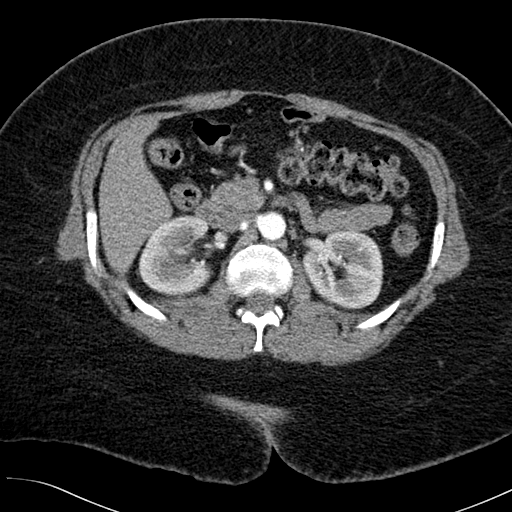
[im 4/100  lung]
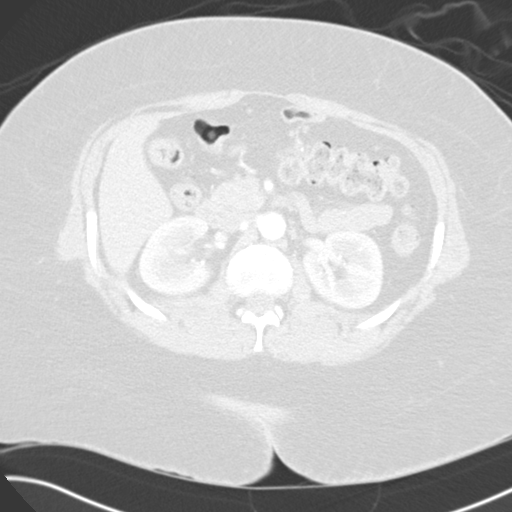
[im 12/100  lung]
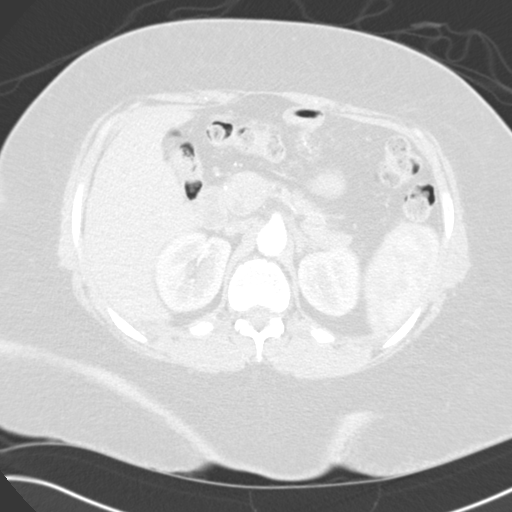
[im 19/100  lung]
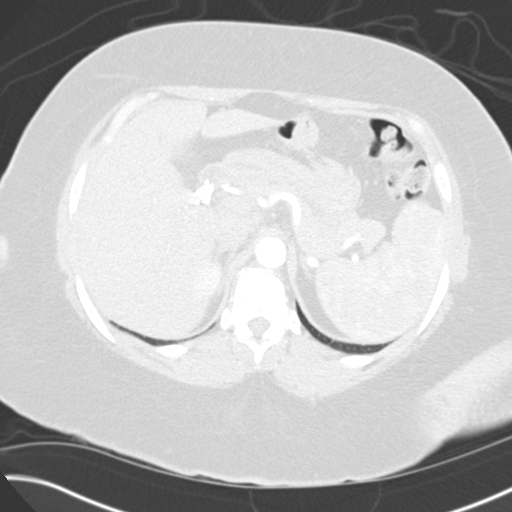
[im 23/100  lung]
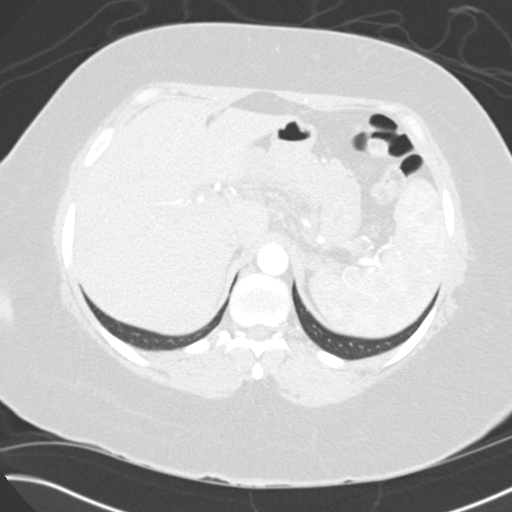
[im 30/100  mediastinal]
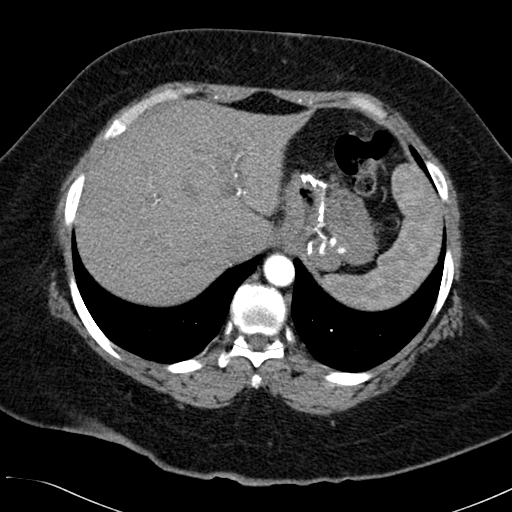
[im 30/100  lung]
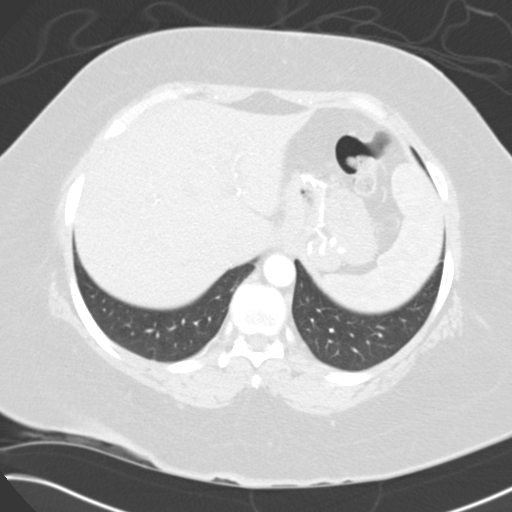
[im 37/100  lung]
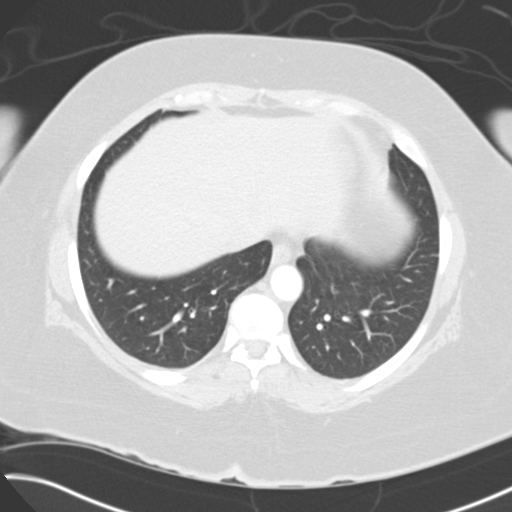
[im 41/100  lung]
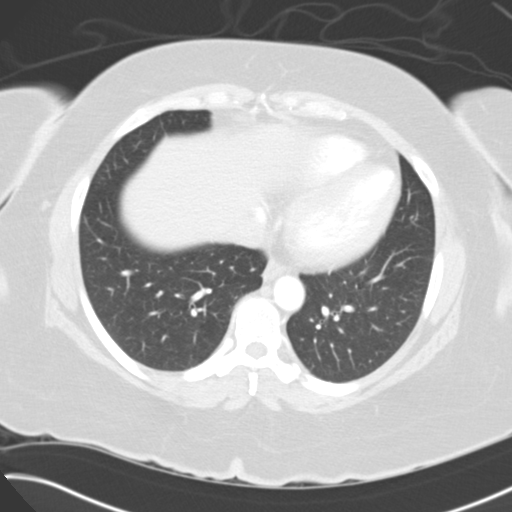
[im 48/100  lung]
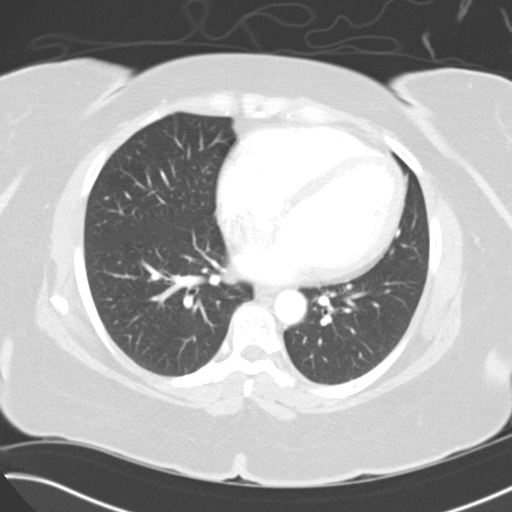
[im 53/100  mediastinal]
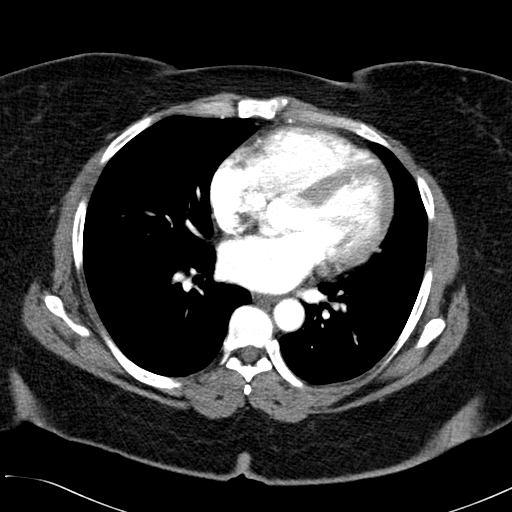
[im 53/100  lung]
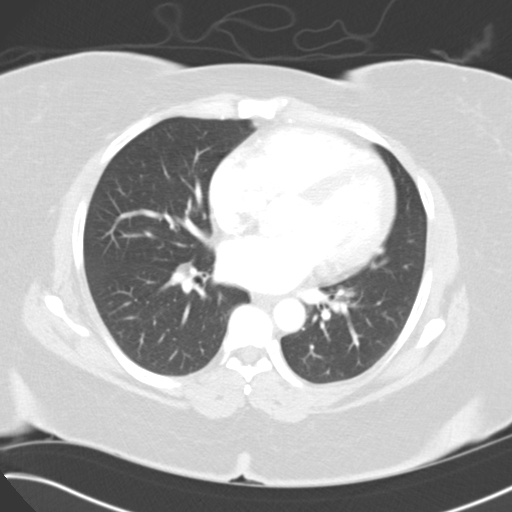
[im 59/100  lung]
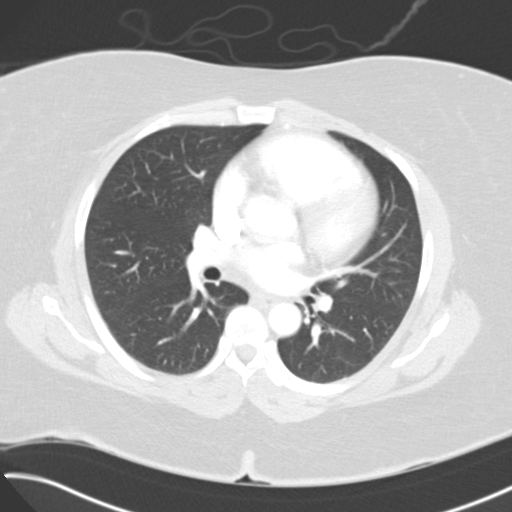
[im 63/100  lung]
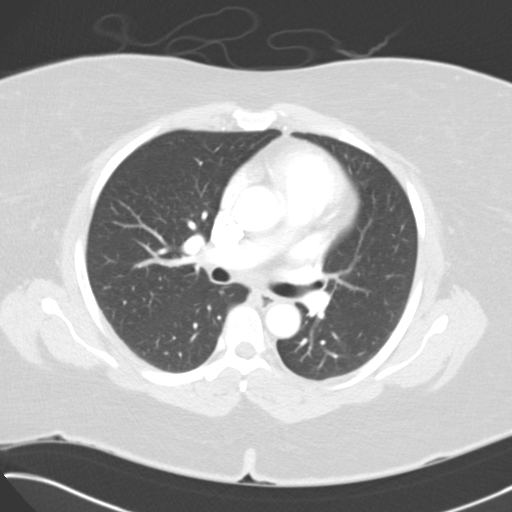
[im 70/100  lung]
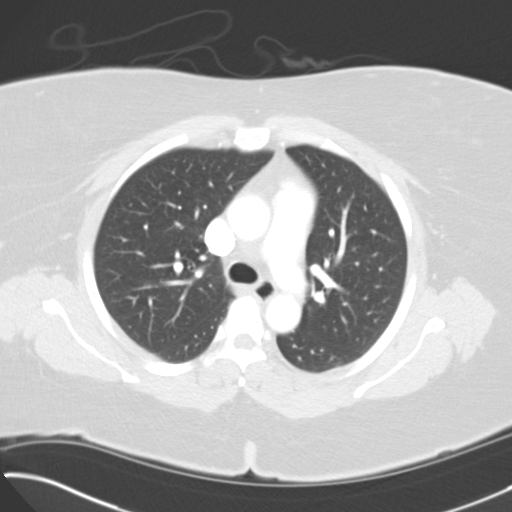
[im 78/100  mediastinal]
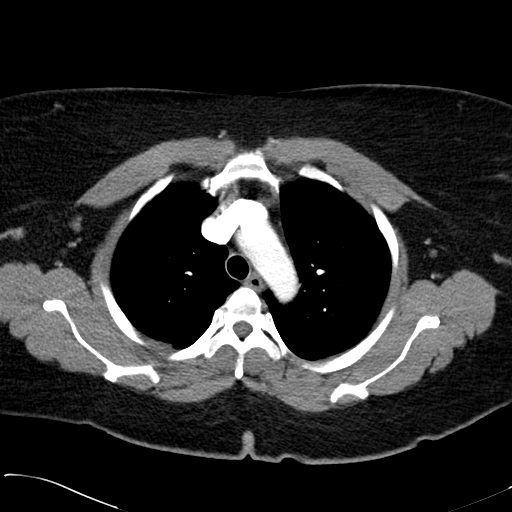
[im 78/100  lung]
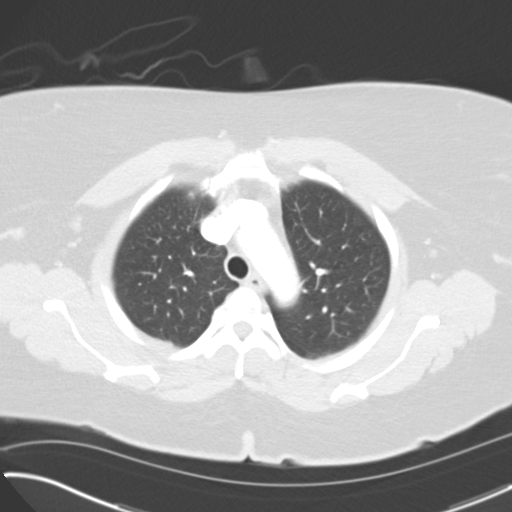
[im 81/100  lung]
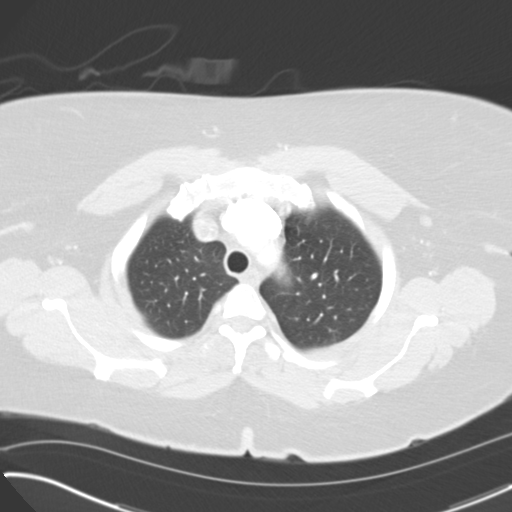
[im 89/100  lung]
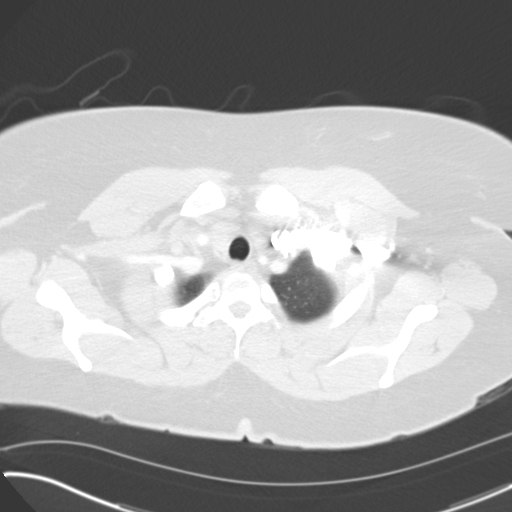
[im 96/100  lung]
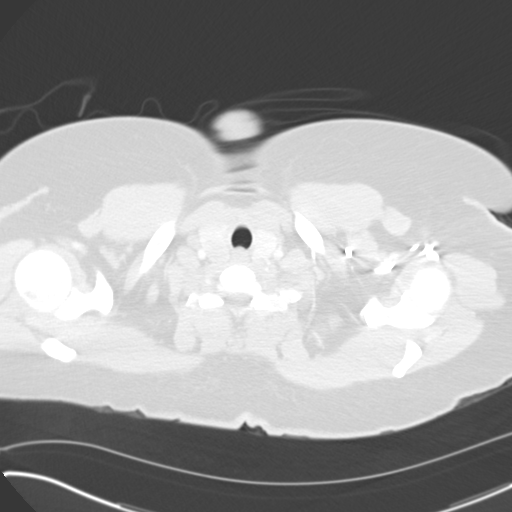

[16 of 33 positions shown; findings below may reference images not displayed]

FINDINGS: There is adequate opacification of the pulmonary arteries. There is no
pulmonary embolus. The main pulmonary artery, right main pulmonary artery,
and left main pulmonary arteries are normal in size. The heart size is
normal. There is no pericardial effusion.

The lungs are clear. There is no focal consolidation, pleural effusion, or
pneumothorax.

There is no axillary, hilar, or mediastinal adenopathy.

The osseous structures are unremarkable.

The visualized portions of the upper abdomen are unremarkable.
IMPRESSION: 1. No CT evidence of pulmonary embolus.

[REDACTED]

## 2013-11-10 ENCOUNTER — Observation Stay (HOSPITAL_COMMUNITY)
Admission: EM | Admit: 2013-11-10 | Discharge: 2013-11-10 | Disposition: A | Discharge status: Other Acute Inpatient Hospital | Payer: 59 | Attending: Emergency Medicine | Admitting: Emergency Medicine

## 2013-11-10 ENCOUNTER — Ambulatory Visit (HOSPITAL_COMMUNITY): Payer: 59

## 2013-11-10 ENCOUNTER — Encounter (HOSPITAL_COMMUNITY): Payer: Self-pay | Admitting: Emergency Medicine

## 2013-11-10 DIAGNOSIS — R197 Diarrhea, unspecified: Secondary | ICD-10-CM | POA: Insufficient documentation

## 2013-11-10 DIAGNOSIS — Z9089 Acquired absence of other organs: Secondary | ICD-10-CM | POA: Insufficient documentation

## 2013-11-10 DIAGNOSIS — Z79899 Other long term (current) drug therapy: Secondary | ICD-10-CM | POA: Insufficient documentation

## 2013-11-10 DIAGNOSIS — D509 Iron deficiency anemia, unspecified: Secondary | ICD-10-CM | POA: Diagnosis present

## 2013-11-10 DIAGNOSIS — Z886 Allergy status to analgesic agent status: Secondary | ICD-10-CM | POA: Insufficient documentation

## 2013-11-10 DIAGNOSIS — R112 Nausea with vomiting, unspecified: Secondary | ICD-10-CM

## 2013-11-10 DIAGNOSIS — R109 Unspecified abdominal pain: Secondary | ICD-10-CM | POA: Diagnosis present

## 2013-11-10 DIAGNOSIS — Z885 Allergy status to narcotic agent status: Secondary | ICD-10-CM | POA: Insufficient documentation

## 2013-11-10 DIAGNOSIS — R1032 Left lower quadrant pain: Secondary | ICD-10-CM

## 2013-11-10 DIAGNOSIS — F3289 Other specified depressive episodes: Secondary | ICD-10-CM | POA: Insufficient documentation

## 2013-11-10 DIAGNOSIS — Z9884 Bariatric surgery status: Secondary | ICD-10-CM

## 2013-11-10 DIAGNOSIS — F32A Depression, unspecified: Secondary | ICD-10-CM | POA: Diagnosis present

## 2013-11-10 DIAGNOSIS — F329 Major depressive disorder, single episode, unspecified: Secondary | ICD-10-CM | POA: Insufficient documentation

## 2013-11-10 HISTORY — DX: Other specified postprocedural states: R11.2

## 2013-11-10 HISTORY — DX: Other specified postprocedural states: Z98.890

## 2013-11-10 LAB — CBC WITH DIFFERENTIAL/PLATELET
BASOS ABS: 0 10*3/uL (ref 0.0–0.1)
Basophils Relative: 1 % (ref 0–1)
EOS PCT: 1 % (ref 0–5)
Eosinophils Absolute: 0 10*3/uL (ref 0.0–0.7)
HCT: 30.7 % — ABNORMAL LOW (ref 36.0–46.0)
Hemoglobin: 9.8 g/dL — ABNORMAL LOW (ref 12.0–15.0)
LYMPHS ABS: 1.8 10*3/uL (ref 0.7–4.0)
Lymphocytes Relative: 38 % (ref 12–46)
MCH: 20.7 pg — ABNORMAL LOW (ref 26.0–34.0)
MCHC: 31.9 g/dL (ref 30.0–36.0)
MCV: 64.8 fL — AB (ref 78.0–100.0)
Monocytes Absolute: 0.3 10*3/uL (ref 0.1–1.0)
Monocytes Relative: 7 % (ref 3–12)
NEUTROS PCT: 53 % (ref 43–77)
Neutro Abs: 2.7 10*3/uL (ref 1.7–7.7)
PLATELETS: 185 10*3/uL (ref 150–400)
RBC: 4.74 MIL/uL (ref 3.87–5.11)
RDW: 19.4 % — AB (ref 11.5–15.5)
WBC: 4.8 10*3/uL (ref 4.0–10.5)

## 2013-11-10 LAB — URINALYSIS, ROUTINE W REFLEX MICROSCOPIC
Bilirubin Urine: NEGATIVE
GLUCOSE, UA: NEGATIVE mg/dL
Ketones, ur: NEGATIVE mg/dL
LEUKOCYTES UA: NEGATIVE
Nitrite: NEGATIVE
PH: 5.5 (ref 5.0–8.0)
Protein, ur: NEGATIVE mg/dL
Specific Gravity, Urine: 1.019 (ref 1.005–1.030)
Urobilinogen, UA: 1 mg/dL (ref 0.0–1.0)

## 2013-11-10 LAB — URINE MICROSCOPIC-ADD ON

## 2013-11-10 LAB — COMPREHENSIVE METABOLIC PANEL
ALBUMIN: 3.8 g/dL (ref 3.5–5.2)
ALT: 23 U/L (ref 0–35)
AST: 23 U/L (ref 0–37)
Alkaline Phosphatase: 112 U/L (ref 39–117)
BUN: 14 mg/dL (ref 6–23)
CHLORIDE: 106 meq/L (ref 96–112)
CO2: 22 mEq/L (ref 19–32)
CREATININE: 0.95 mg/dL (ref 0.50–1.10)
Calcium: 9.3 mg/dL (ref 8.4–10.5)
GFR calc Af Amer: 81 mL/min — ABNORMAL LOW (ref >90)
GFR calc non Af Amer: 70 mL/min — ABNORMAL LOW (ref >90)
Glucose, Bld: 102 mg/dL — ABNORMAL HIGH (ref 70–99)
Potassium: 3.9 mEq/L (ref 3.7–5.3)
SODIUM: 141 meq/L (ref 137–147)
Total Bilirubin: 0.6 mg/dL (ref 0.3–1.2)
Total Protein: 7.3 g/dL (ref 6.0–8.3)

## 2013-11-10 LAB — LIPASE, BLOOD: Lipase: 28 U/L (ref 11–59)

## 2013-11-10 MED ORDER — HYDROMORPHONE HCL PF 1 MG/ML IJ SOLN: 1.0000 mg | Freq: Once | INTRAMUSCULAR | Status: DC

## 2013-11-10 MED ORDER — GI COCKTAIL ~~LOC~~
30.0000 mL | Freq: Once | ORAL | Status: AC
  Administered 2013-11-10: 30 mL via ORAL
  Filled 2013-11-10: qty 30

## 2013-11-10 MED ORDER — SODIUM CHLORIDE 0.9 % IV SOLN
INTRAVENOUS | Status: DC
  Administered 2013-11-10: 15:00:00 via INTRAVENOUS

## 2013-11-10 MED ORDER — DIPHENHYDRAMINE HCL 50 MG/ML IJ SOLN
25.0000 mg | Freq: Once | INTRAMUSCULAR | Status: AC
  Administered 2013-11-10: 25 mg via INTRAVENOUS
  Filled 2013-11-10: qty 1

## 2013-11-10 MED ORDER — SODIUM CHLORIDE 0.9 % IV BOLUS (SEPSIS)
1000.0000 mL | Freq: Once | INTRAVENOUS | Status: AC
  Administered 2013-11-10: 1000 mL via INTRAVENOUS

## 2013-11-10 MED ORDER — HYDROMORPHONE HCL PF 1 MG/ML IJ SOLN
1.0000 mg | Freq: Once | INTRAMUSCULAR | Status: AC
  Administered 2013-11-10: 1 mg via INTRAVENOUS
  Filled 2013-11-10: qty 1

## 2013-11-10 MED ORDER — MORPHINE SULFATE 4 MG/ML IJ SOLN
4.0000 mg | Freq: Once | INTRAMUSCULAR | Status: AC
  Administered 2013-11-10: 4 mg via INTRAVENOUS
  Filled 2013-11-10: qty 1

## 2013-11-10 MED ORDER — IOHEXOL 300 MG/ML  SOLN
100.0000 mL | Freq: Once | INTRAMUSCULAR | Status: AC | PRN
  Administered 2013-11-10: 100 mL via INTRAVENOUS

## 2013-11-10 MED ORDER — ONDANSETRON HCL 4 MG/2ML IJ SOLN
4.0000 mg | Freq: Once | INTRAMUSCULAR | Status: AC
  Administered 2013-11-10: 4 mg via INTRAVENOUS
  Filled 2013-11-10: qty 2

## 2013-11-10 MED ORDER — IOHEXOL 300 MG/ML  SOLN
50.0000 mL | Freq: Once | INTRAMUSCULAR | Status: AC | PRN
  Administered 2013-11-10: 50 mL via ORAL

## 2013-11-10 MED ORDER — ONDANSETRON HCL 4 MG/2ML IJ SOLN
4.0000 mg | Freq: Three times a day (TID) | INTRAMUSCULAR | Status: DC | PRN
  Filled 2013-11-10: qty 2

## 2013-11-10 MED ORDER — PROMETHAZINE HCL 25 MG/ML IJ SOLN
12.5000 mg | Freq: Once | INTRAMUSCULAR | Status: AC
  Administered 2013-11-10: 12.5 mg via INTRAVENOUS
  Filled 2013-11-10: qty 1

## 2013-11-10 MED ORDER — HYDROMORPHONE HCL PF 1 MG/ML IJ SOLN
1.0000 mg | INTRAMUSCULAR | Status: DC | PRN
  Administered 2013-11-10: 1 mg via INTRAVENOUS
  Filled 2013-11-10: qty 1

## 2013-11-10 NOTE — Consult Note (Signed)
ATTENDING ADDENDUM:  I personally reviewed patient's record, examined the patient, and formulated the following assessment and plan:  Pt has normal labs and CT scan.  No signs of surgical abdomen.  C/O LLQ and periumbilical pain.  Had a BM yesterday.  Denies constipation.  Recommend transfer/follow up with surgeon in [place].

## 2013-11-10 NOTE — ED Provider Notes (Addendum)
Care assumed at shift change  After sign out from Dr. Ranae PalmsYelverton. Patient is a 48 year old female with history of gastric bypass surgery in 2013 performed by a Dr. Smitty CordsBruce from [place] [place]. She presents today with complaints of severe left-sided abdominal pain that started in the night after returning home from work. Workup revealed unremarkable laboratory studies and she was signed out to me awaiting results of a CT scan. This was performed and revealed no acute intra-abdominal pathology. The patient had received multiple doses of pain and nausea medication however is feeling no better.  I consulted with general surgery. The patient was evaluated by Dr. Maisie Fushomas who did not feel as though there were any emergent surgical conditions. Her recommendations were for followup with her surgeon. The patient was not feeling well enough to go home and continue to have emesis. I made multiple attempts to speak with her surgeon or surgeon covering his practice. I made approximately 6 phone calls to [facility], however never received a call back. I was given a cell phone number for her surgeon, Dr. Smitty CordsBruce. I called him and the call was not answered. I left a message on his answering service.  I also consulted with Dr. Dulce Sellarutlaw from gastroenterology who did not feel as though he had much to offer either. His recommendations were for admission to medicine for pain control and he would consult once on the floor.  This was a very time-consuming and difficult disposition to obtain as the patient's surgeon was unavailable and no physicians were physician representatives from the surgical office and would respond to my calls. I ended up speaking with Dr. Cena BentonVega who is the hospitalist on call who kindly agreed to admit the patient for pain control and further evaluation.  Geoffery Lyonsouglas Glennis Borger, MD 11/10/13 1449  After arrangements were made for admission, I finally heard back from the patient's surgeon, Dr. Smitty CordsBruce. He is  willing to accept the patient in transfer to [facility] [place]. I informed the patient of the change of plan, and she stated that this would not work because she had to get her daughter home. I explained to her that I was uncomfortable with her operating a vehicle after receiving the quantity of pain medication she did receive. She informed me that her daughter would be driving as she had a [employer] quality scientist[employer]ner's Permit.  I advised her it was not appropriate for her to be the supervising driver while under the influence of these pain medications either. She stated she would attempt to make arrangements for someone to pick her daughter up so that she could go through with the transfer.   Geoffery Lyonsouglas Brenisha Tsui, MD 11/10/13 [phone]

## 2013-11-10 NOTE — ED Notes (Signed)
Pt states that she has a hx of ulcers and began to have abd pain around 1am; pt states that the pain is similar to what she has had in the past; pt c/o N/V; denies blood to vomit

## 2013-11-10 NOTE — ED Provider Notes (Signed)
CSN: [id number]     Arrival date & time 11/10/13  0416 History   First MD Initiated Contact with Patient 11/10/13 0424     Chief Complaint  Patient presents with  . Abdominal Pain     (Consider location/radiation/quality/duration/timing/severity/associated sxs/prior Treatment) HPI Patient's had multiple abdominal surgeries including gastric bypass. She also has a history of peptic ulcer disease. She presents with acute onset upper abdominal pain starting this morning around 1 AM. Is associated with one episode of emesis. Patient states he wishes to person the vomit without skull through gram substance or gross blood. She had a normal bowel movement yesterday. She's had no fevers or chills. She's had no urinary or vaginal symptoms. Past Medical History  Diagnosis Date  . Ulcer    Past Surgical History  Procedure Laterality Date  . Cesarean section  1997, 1999    x2  . Tubal ligation  2002  . Cholecystectomy  1997  . Myomectomy  2010  . Knee surgery  2001    left  . Hysteroscopy  2006  . Gastric bypass  2013   Family History  Problem Relation Age of Onset  . Uterine cancer Mother   . Hypertension Mother   . Hyperlipidemia Mother   . Breast cancer Maternal Aunt   . Lung cancer Maternal Uncle   . Diabetes Maternal Grandfather   . Hyperlipidemia Maternal Grandfather   . Stroke Maternal Grandfather    History  Substance Use Topics  . Smoking status: Never Smoker   . Smokeless tobacco: Never Used  . Alcohol Use: No     Comment: occasionally   OB History   Grav Para Term Preterm Abortions TAB SAB Ect Mult Living                 Review of Systems  Constitutional: Negative for fever and chills.  Respiratory: Negative for shortness of breath.   Cardiovascular: Negative for chest pain.  Gastrointestinal: Positive for nausea, vomiting and abdominal pain. Negative for diarrhea, constipation and blood in stool.  Genitourinary: Negative for dysuria, frequency and flank pain.   Musculoskeletal: Negative for back pain, myalgias, neck pain and neck stiffness.  Skin: Negative for rash and wound.  Neurological: Negative for dizziness, weakness, light-headedness, numbness and headaches.  All other systems reviewed and are negative.     Allergies  Aspirin and Percocet  Home Medications   Prior to Admission medications   Medication Sig Start Date End Date Taking? Authorizing Provider  Biotin 5000 MCG CAPS Take 1 capsule by mouth daily.    Historical Provider, MD  Calcium 600-400 MG-UNIT CHEW Chew 1 tablet by mouth 2 (two) times daily.    Historical Provider, MD  Cholecalciferol (VITAMIN D3) 2000 UNITS CHEW Chew 1 tablet by mouth 2 (two) times daily.    Historical Provider, MD  ferrous sulfate (FEOSOL) 325 (65 FE) MG tablet Take 1 tablet (325 mg total) by mouth daily with breakfast. 06/29/13   Etta Grandchildhomas L Jones, MD  fish oil-omega-3 fatty acids 1000 MG capsule Take 1 g by mouth 2 (two) times daily.     Historical Provider, MD  Multiple Vitamin (MULITIVITAMIN WITH MINERALS) TABS Take 1 tablet by mouth 2 (two) times daily.     Historical Provider, MD  omeprazole (PRILOSEC) 20 MG capsule Take 20 mg by mouth 2 (two) times daily.    Historical Provider, MD  Safflower Oil (CLA) 1000 MG CAPS Take 1 capsule by mouth 2 (two) times a week.  Historical Provider, MD  sucralfate (CARAFATE) 1 GM/10ML suspension Take 1 g by mouth 4 (four) times daily.    Historical Provider, MD  thiamine 100 MG tablet Take 100 mg by mouth daily.    Historical Provider, MD   BP 132/97  Pulse 70  Temp(Src) 98.2 F (36.8 C) (Oral)  Resp 20  Ht 5\' 6"  (1.676 m)  Wt 178 lb (80.74 kg)  BMI 28.74 kg/m2  SpO2 100%  LMP 11/10/2013 Physical Exam  Nursing note and vitals reviewed. Constitutional: She is oriented to person, place, and time. She appears well-developed and well-nourished. She appears distressed.  Writhing in bed  HENT:  Head: Normocephalic and atraumatic.  Mouth/Throat: Oropharynx is  clear and moist.  Eyes: EOM are normal. Pupils are equal, round, and reactive to light.  Neck: Normal range of motion. Neck supple.  Cardiovascular: Normal rate and regular rhythm.   Pulmonary/Chest: Effort normal and breath sounds normal. No respiratory distress. She has no wheezes. She has no rales.  Abdominal: Soft. Bowel sounds are normal. She exhibits no distension and no mass. There is tenderness (difficult exam due to patient noncompliance. She appears to be most tender in the epigastric and right upper quadrant areas.). There is no rebound and no guarding.  Musculoskeletal: Normal range of motion. She exhibits no edema and no tenderness.  No CVA tenderness  Neurological: She is alert and oriented to person, place, and time.  Moves all extremities. Sensation grossly intact.  Skin: Skin is warm and dry. No rash noted. No erythema.    ED Course  Procedures (including critical care time) Labs Review Labs Reviewed  CBC WITH DIFFERENTIAL  COMPREHENSIVE METABOLIC PANEL  LIPASE, BLOOD  URINALYSIS, ROUTINE W REFLEX MICROSCOPIC    Imaging Review No results found.   EKG Interpretation None      MDM   Final diagnoses:  None   Patient with persistent pain despite multiple doses of narcotic medication. Do not oncoming emergency physician pending CT abdomen and pelvis.     Loren Raceravid Briele Lagasse, MD 11/11/13 [phone]

## 2013-11-10 NOTE — H&P (Addendum)
[facility] History and Physical  Michelle Osgoodopaz D Voiles EXB:[id number] DOB: [birth year] DOA: 11/10/2013  Addendum: After discussion with the ED physician patient's surgeon is located elsewhere would like patient to be transferred to his service for further evaluation. I agree with the plan and patient will be transferred from the ED floor to a different accepting hospital.  Referring physician: Dr. Judd Lienelo PCP: Nicki ReaperBAITY, REGINA, NP   Chief Complaint: Nausea and emesis  HPI: Michelle Molina is a 48 y.o. female  With history of depression, iron deficiency anemia, GI ulcer on Carafate and PPI and history of gastric bypass for obesity. Who presents with one-day complaint of nausea vomiting or abdominal discomfort. This occurred 4 hours after eating a frozen dinner last night while at work. She denies any fevers or bloody stools. She does however report diarrhea.  She has had only minimal relief here in the ED. We have been consulted for further admission evaluation and recommendations given persistent nausea and emesis.  While in the ED patient was evaluated by general surgery home did not see any surgical needs.  Case was also discussed with GI who recommended we consult him should he feel the need to consult him after our evaluation.   Review of Systems:  Constitutional:  No weight loss, night sweats, Fevers, chills, fatigue.  HEENT:  No headaches, Difficulty swallowing,Tooth/dental problems,Sore throat,  No sneezing, itching, ear ache, nasal congestion, post nasal drip,  Cardio-vascular:  No chest pain, Orthopnea, PND, swelling in lower extremities, anasarca, dizziness, palpitations  GI:  No heartburn, indigestion, + abdominal pain, + nausea and vomiting, + diarrhea, change in bowel habits, loss of appetite  Resp:  No shortness of breath with exertion or at rest. No excess mucus, no productive cough, No non-productive cough, No coughing up of blood.No change in color of mucus.No wheezing.No chest wall  deformity  Skin:  no rash or lesions.  GU:  no dysuria, change in color of urine, no urgency or frequency. No flank pain.  Musculoskeletal:  No joint pain or swelling. No decreased range of motion. No back pain.  Psych:  No change in mood or affect. No depression or anxiety. No memory loss.   Past Medical History  Diagnosis Date  . Ulcer   . PONV (postoperative nausea and vomiting)    Past Surgical History  Procedure Laterality Date  . Cesarean section  1997, 1999    x2  . Tubal ligation  2002  . Cholecystectomy  1997  . Myomectomy  2010  . Knee surgery  2001    left  . Hysteroscopy  2006  . Gastric bypass  2013   Social History:  reports that she has never smoked. She has never used smokeless tobacco. She reports that she does not drink alcohol or use illicit drugs.  Allergies  Allergen Reactions  . Aspirin Nausea Only  . Ibuprofen     Had gastric bypass  . Percocet [Oxycodone-Acetaminophen] Itching    Family History  Problem Relation Age of Onset  . Uterine cancer Mother   . Hypertension Mother   . Hyperlipidemia Mother   . Breast cancer Maternal Aunt   . Lung cancer Maternal Uncle   . Diabetes Maternal Grandfather   . Hyperlipidemia Maternal Grandfather   . Stroke Maternal Grandfather      Prior to Admission medications   Medication Sig Start Date End Date Taking? Authorizing Provider  acetaminophen (TYLENOL) 500 MG tablet Take 1,000 mg by mouth every 6 (six) hours as needed  for mild pain.   Yes Historical Provider, MD  Biotin 5000 MCG CAPS Take 1 capsule by mouth daily.   Yes Historical Provider, MD  Calcium 600-400 MG-UNIT CHEW Chew 1 tablet by mouth 2 (two) times daily.   Yes Historical Provider, MD  ferrous sulfate (FEOSOL) 325 (65 FE) MG tablet Take 1 tablet (325 mg total) by mouth daily with breakfast. 06/29/13  Yes Etta Grandchild, MD  Multiple Vitamin (MULITIVITAMIN WITH MINERALS) TABS Take 1 tablet by mouth 2 (two) times daily.    Yes Historical  Provider, MD  omeprazole (PRILOSEC) 20 MG capsule Take 20 mg by mouth 2 (two) times daily.   Yes Historical Provider, MD  sucralfate (CARAFATE) 1 GM/10ML suspension Take 1 g by mouth 4 (four) times daily.   Yes Historical Provider, MD   Physical Exam: Filed Vitals:   11/10/13 1458  BP: 104/61  Pulse: 59  Temp: 98.9 F (37.2 C)  Resp: 16    BP 104/61  Pulse 59  Temp(Src) 98.9 F (37.2 C) (Oral)  Resp 16  Ht 5\' 6"  (1.676 m)  Wt 80.74 kg (178 lb)  BMI 28.74 kg/m2  SpO2 98%  LMP 11/10/2013  General:  Appears calm and comfortable Eyes: PERRL, normal lids, irises & conjunctiva ENT: grossly normal hearing, lips & tongue Neck: no LAD, masses or thyromegaly Cardiovascular: RRR, no m/r/g. No LE edema. Respiratory: CTA bilaterally, no w/r/r. Normal respiratory effort. Abdomen: soft, ND, generalized discomfort on palpation. Skin: no rash or induration seen on limited exam Musculoskeletal: grossly normal tone BUE/BLE Psychiatric: grossly normal mood and affect, speech fluent and appropriate Neurologic: grossly non-focal.          Labs on Admission:  Basic Metabolic Panel:  Recent Labs Lab 11/10/13 0500  NA 141  K 3.9  CL 106  CO2 22  GLUCOSE 102*  BUN 14  CREATININE 0.95  CALCIUM 9.3   Liver Function Tests:  Recent Labs Lab 11/10/13 0500  AST 23  ALT 23  ALKPHOS 112  BILITOT 0.6  PROT 7.3  ALBUMIN 3.8    Recent Labs Lab 11/10/13 0500  LIPASE 28   No results found for this basename: AMMONIA,  in the last 168 hours CBC:  Recent Labs Lab 11/10/13 0500  WBC 4.8  NEUTROABS 2.7  HGB 9.8*  HCT 30.7*  MCV 64.8*  PLT 185   Cardiac Enzymes: No results found for this basename: CKTOTAL, CKMB, CKMBINDEX, TROPONINI,  in the last 168 hours  BNP (last 3 results) No results found for this basename: PROBNP,  in the last 8760 hours CBG: No results found for this basename: GLUCAP,  in the last 168 hours  Radiological Exams on Admission: Ct Abdomen Pelvis W  Contrast  11/10/2013   CLINICAL DATA:  48 year old female with lower abdominal pain since 0100 hrs. Nausea and vomiting. Gastric bypass surgery 2 years ago. Initial encounter.  EXAM: CT ABDOMEN AND PELVIS WITH CONTRAST  TECHNIQUE: Multidetector CT imaging of the abdomen and pelvis was performed using the standard protocol following bolus administration of intravenous contrast.  CONTRAST:  OMNIPAQUE IOHEXOL 300 MG/ML  SOLN  COMPARISON:  None.  FINDINGS: Mild crowding of markings at the lung bases. Cardiomegaly. No pericardial or pleural effusion.  Chronic left side pars fracture at the lumbosacral junction. Lower lumbar disc and endplate degeneration. No spondylolisthesis. Transitional lumbosacral anatomy. No acute osseous abnormality identified.  Small volume pelvic free fluid. Decompressed distal colon. Negative for age uterus and adnexa. Bladder appears normal  for the degree of distention.  The left colon also is decompressed. The splenic flexure contains gas and stool and has a more normal appearance. Possibly related to the previous gastric bypass, the transverse colon appears located posterior to the gastrojejunostomy, but otherwise appears normal. The right colon is redundant and contains gas and stool. No pericecal inflammation. Appendix not clearly delineated. Terminal ileum within normal limits. No dilated small bowel.  Gastrojejunostomy. Small volume of oral contrast within the gastric pouch, extending through the GJ, and in multiple normal appearing proximal small bowel loops. Small bowel anastomosis visible in the left anterior abdomen on series 2, image 39. The duodenum C loop is decompressed and appears within normal limits.  The gallbladder is surgically absent with multiple surgical clips at the gallbladder fossa. There is moderate to severe biliary ductal enlargement throughout the liver and continuing to the duodenum. Still, the CBD measures up to 9 mm diameter and appears to taper normally  to the ampulla. No filling defect identified. There is superimposed prominence of the hepatic veins and hepatic IVC, as well as the infrahepatic IVC and pelvic veins. There is a mild degree of subcutaneous stranding diffusely. Mildly heterogeneous liver enhancement is associated. No discrete liver mass.  Spleen, pancreas, and adrenal glands are within normal limits. Portal venous system is patent. Kidneys within normal limits. No abdominal free fluid.  Major arterial structures in the abdomen and pelvis are patent.  IMPRESSION: 1. Cardiomegaly with prominent IVC and hepatic veins suggesting a degree of congestive heart failure. Clinical correlation recommended. 2. 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 3. Status post cholecystectomy with diffuse biliary tree enlargement, but in the absence of hyperbilirubinemia favor this is simply postoperative in nature. 4. Small volume pelvic free fluid, nonspecific and probably physiologic if this patient is premenopausal.   Electronically Signed   By: Augusto GambleLee  Hall M.D.   On: 11/10/2013 09:11     Assessment/Plan Active Problems:   Abdominal pain\ Nausea and vomiting in adult - Symptoms could be either food poisoning from bacterial overgrowth in frozen meal or could be secondary to viral gastroenteritis. - Supportive therapy with anti-emetics, maintenance IV fluids - Patient may require GI consult if she shows no improvement and continues to complain of much discomfort. Pt has history of ulcer and may require endoscopy if she does not improve  Depression - Stable  H/o Roux en Y type gastric bypass - R. surgeons have recommended no surgery after evaluation in the emergency department  Diarrhea - Will place on enteric precautions - stool cultures, check for ova and parasites - c diff pcr  Iron deficiency anemia - stable continue iron supplementation   Code Status: full Family Communication: discussed with patient  directly Disposition Plan: Pending resolution of nausea and emesis  Time spent: > 55 minutes  [employer] Eustacia Urbanek [facility] Pager [phone]  **Disclaimer: This note may have been dictated with voice recognition software. Similar sounding words can inadvertently be transcribed and this note may contain transcription errors which may not have been corrected upon publication of note.**

## 2013-11-10 NOTE — Consult Note (Signed)
Reason for Consult:  Abdominal pain with history of gastric Bypass 01/2012 Referring Physician: Dr. Veryl Speak (ED)  Michelle Molina is an 48 y.o. female.  HPI: Pt presents with onset of abdominal pain, nausea and vomiting at home once, and here in ED x 1.  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  She had her usual PM(lunch for her) meal without any issues.  Around 1 AM she started having pain lower abdomen and then nausea with some vomiting, she also reports one episode of diarrhea.  Pain has gotten progressively worse and she now comes to the ED for evaluation.She has a history of gastric Bypass in [facility] 01/2012.  During that time she has had issues with ulcers and has been on Prilosec and Carafate since January of 2014.  She has also had two surgeries, all laparoscopic for "scar tissue," done in  [place], [place], by her gastric bypass surgeon, the last was in April 2014.  She had another surgery between 01/2012 and her last, 09/2012, she cannot remember the dates.  She saw him about 1 month ago, for, "nausea and a gassy feeling."  At that time they wanted to repeat there endoscopy but she has not been able to do that.  Her symptoms have been stable until about 1 AM today.  She is still on PPI & Carafate.  Initially her symptoms last night seemed like the ones she saw her bypass surgeon for ,but have become more severe and she say now this is different. She has been worked up by the ED. She is afebrile, she is not hypertensive.  She has some mild anemia, but otherwise they are normal. She has a few red cells in urine, with ongoing menstrual cycles.  Her CT scan is showing some cardiomegaly and some IVC prominence.  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  Some free pelvic fluid.  She remains significantly tender in the LLQ, and doesn't want to turn because of pain, she also said breathing deep for the CT caused pain.  We are ask to see.  Past Medical History   Diagnosis Date   Anemia   . Ulcer     Past Surgical History  Procedure Laterality Date  . Cesarean section  1997, 1999    x2  . Tubal ligation  2002  . Cholecystectomy  1997  . Myomectomy  2010  . Knee surgery  2001    left  . Hysteroscopy  2006  . Gastric bypass 01/2012 EGD x 1 Laparoscopic evaluation and treatment of "scar tissue," x 2 by her Gastric surgeon since initial surgery 01/2012. 2nd procedure 09/2012. 1st procedure she cannot remember.   2013    Family History  Problem Relation Age of Onset  . Uterine cancer Mother   . Hypertension Mother   . Hyperlipidemia Mother   . Breast cancer Maternal Aunt   . Lung cancer Maternal Uncle   . Diabetes Maternal Grandfather   . Hyperlipidemia Maternal Grandfather   . Stroke Maternal Grandfather     Social History:  reports that she has never smoked. She has never used smokeless tobacco. She reports that she does not drink alcohol or use illicit drugs. Divorced; she works as a Counsellor for [employer].  Tobacco:  None Drug:  None ETOH:  None since her gastric bypass  Allergies:  Allergies  Allergen Reactions  . Aspirin Nausea Only  . Ibuprofen     Had gastric  bypass  . Percocet [Oxycodone-Acetaminophen] Itching     Prior to Admission:  Prior to Admission medications   Medication Sig Start Date End Date Taking? Authorizing Provider  acetaminophen (TYLENOL) 500 MG tablet Take 1,000 mg by mouth every 6 (six) hours as needed for mild pain.   Yes Historical Provider, MD  Biotin 5000 MCG CAPS Take 1 capsule by mouth daily.   Yes Historical Provider, MD  Calcium 600-400 MG-UNIT CHEW Chew 1 tablet by mouth 2 (two) times daily.   Yes Historical Provider, MD  ferrous sulfate (FEOSOL) 325 (65 FE) MG tablet Take 1 tablet (325 mg total) by mouth daily with breakfast. 06/29/13  Yes Janith Lima, MD  Multiple Vitamin (MULITIVITAMIN WITH MINERALS) TABS Take 1 tablet by mouth 2 (two) times daily.    Yes Historical  Provider, MD  omeprazole (PRILOSEC) 20 MG capsule Take 20 mg by mouth 2 (two) times daily.   Yes Historical Provider, MD  sucralfate (CARAFATE) 1 GM/10ML suspension Take 1 g by mouth 4 (four) times daily.   Yes Historical Provider, MD    : Anti-infectives   None      Results for orders placed during the hospital encounter of 11/10/13 (from the past 48 hour(s))  CBC WITH DIFFERENTIAL     Status: Abnormal   Collection Time    11/10/13  5:00 AM      Result Value Ref Range   WBC 4.8  4.0 - 10.5 K/uL   RBC 4.74  3.87 - 5.11 MIL/uL   Hemoglobin 9.8 (*) 12.0 - 15.0 g/dL   HCT 30.7 (*) 36.0 - 46.0 %   MCV 64.8 (*) 78.0 - 100.0 fL   MCH 20.7 (*) 26.0 - 34.0 pg   MCHC 31.9  30.0 - 36.0 g/dL   RDW 19.4 (*) 11.5 - 15.5 %   Platelets 185  150 - 400 K/uL   Neutrophils Relative % 53  43 - 77 %   Lymphocytes Relative 38  12 - 46 %   Monocytes Relative 7  3 - 12 %   Eosinophils Relative 1  0 - 5 %   Basophils Relative 1  0 - 1 %   Neutro Abs 2.7  1.7 - 7.7 K/uL   Lymphs Abs 1.8  0.7 - 4.0 K/uL   Monocytes Absolute 0.3  0.1 - 1.0 K/uL   Eosinophils Absolute 0.0  0.0 - 0.7 K/uL   Basophils Absolute 0.0  0.0 - 0.1 K/uL   WBC Morphology VACUOLATED NEUTROPHILS    COMPREHENSIVE METABOLIC PANEL     Status: Abnormal   Collection Time    11/10/13  5:00 AM      Result Value Ref Range   Sodium 141  137 - 147 mEq/L   Potassium 3.9  3.7 - 5.3 mEq/L   Chloride 106  96 - 112 mEq/L   CO2 22  19 - 32 mEq/L   Glucose, Bld 102 (*) 70 - 99 mg/dL   BUN 14  6 - 23 mg/dL   Creatinine, Ser 0.95  0.50 - 1.10 mg/dL   Calcium 9.3  8.4 - 10.5 mg/dL   Total Protein 7.3  6.0 - 8.3 g/dL   Albumin 3.8  3.5 - 5.2 g/dL   AST 23  0 - 37 U/L   ALT 23  0 - 35 U/L   Alkaline Phosphatase 112  39 - 117 U/L   Total Bilirubin 0.6  0.3 - 1.2 mg/dL   GFR calc non Af  Amer 70 (*) >90 mL/min   GFR calc Af Amer 81 (*) >90 mL/min   Comment: (NOTE)     The eGFR has been calculated using the CKD EPI equation.     This  calculation has not been validated in all clinical situations.     eGFR's persistently <90 mL/min signify possible Chronic Kidney     Disease.  LIPASE, BLOOD     Status: None   Collection Time    11/10/13  5:00 AM      Result Value Ref Range   Lipase 28  11 - 59 U/L  URINALYSIS, ROUTINE W REFLEX MICROSCOPIC     Status: Abnormal   Collection Time    11/10/13  6:39 AM      Result Value Ref Range   Color, Urine YELLOW  YELLOW   APPearance CLOUDY (*) CLEAR   Specific Gravity, Urine 1.019  1.005 - 1.030   pH 5.5  5.0 - 8.0   Glucose, UA NEGATIVE  NEGATIVE mg/dL   Hgb urine dipstick MODERATE (*) NEGATIVE   Bilirubin Urine NEGATIVE  NEGATIVE   Ketones, ur NEGATIVE  NEGATIVE mg/dL   Protein, ur NEGATIVE  NEGATIVE mg/dL   Urobilinogen, UA 1.0  0.0 - 1.0 mg/dL   Nitrite NEGATIVE  NEGATIVE   Leukocytes, UA NEGATIVE  NEGATIVE  URINE MICROSCOPIC-ADD ON     Status: Abnormal   Collection Time    11/10/13  6:39 AM      Result Value Ref Range   Squamous Epithelial / LPF RARE  RARE   WBC, UA 0-2  <3 WBC/hpf   RBC / HPF 3-6  <3 RBC/hpf   Bacteria, UA FEW (*) RARE   Urine-Other MUCOUS PRESENT      Ct Abdomen Pelvis W Contrast  11/10/2013   CLINICAL DATA:  48 year old female with lower abdominal pain since 0100 hrs. Nausea and vomiting. Gastric bypass surgery 2 years ago. Initial encounter.  EXAM: CT ABDOMEN AND PELVIS WITH CONTRAST  TECHNIQUE: Multidetector CT imaging of the abdomen and pelvis was performed using the standard protocol following bolus administration of intravenous contrast.  CONTRAST:  177mL OMNIPAQUE IOHEXOL 300 MG/ML  SOLN  COMPARISON:  None.  FINDINGS: Mild crowding of markings at the lung bases. Cardiomegaly. No pericardial or pleural effusion.  Chronic left side pars fracture at the lumbosacral junction. Lower lumbar disc and endplate degeneration. No spondylolisthesis. Transitional lumbosacral anatomy. No acute osseous abnormality identified.  Small volume pelvic free fluid.  Decompressed distal colon. Negative for age uterus and adnexa. Bladder appears normal for the degree of distention.  The left colon also is decompressed. The splenic flexure contains gas and stool and has a more normal appearance. Possibly related to the previous gastric bypass, the transverse colon appears located posterior to the gastrojejunostomy, but otherwise appears normal. The right colon is redundant and contains gas and stool. No pericecal inflammation. Appendix not clearly delineated. Terminal ileum within normal limits. No dilated small bowel.  Gastrojejunostomy. Small volume of oral contrast within the gastric pouch, extending through the [place], and in multiple normal appearing proximal small bowel loops. Small bowel anastomosis visible in the left anterior abdomen on series 2, image 39. The duodenum C loop is decompressed and appears within normal limits.  The gallbladder is surgically absent with multiple surgical clips at the gallbladder fossa. There is moderate to severe biliary ductal enlargement throughout the liver and continuing to the duodenum. Still, the CBD measures up to 9 mm diameter and appears to  taper normally to the ampulla. No filling defect identified. There is superimposed prominence of the hepatic veins and hepatic IVC, as well as the infrahepatic IVC and pelvic veins. There is a mild degree of subcutaneous stranding diffusely. Mildly heterogeneous liver enhancement is associated. No discrete liver mass.  Spleen, pancreas, and adrenal glands are within normal limits. Portal venous system is patent. Kidneys within normal limits. No abdominal free fluid.  Major arterial structures in the abdomen and pelvis are patent.  IMPRESSION: 1. Cardiomegaly with prominent IVC and hepatic veins suggesting a degree of congestive heart failure. Clinical correlation recommended. 2. 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 3. Status post cholecystectomy with diffuse  biliary tree enlargement, but in the absence of hyperbilirubinemia favor this is simply postoperative in nature. 4. Small volume pelvic free fluid, nonspecific and probably physiologic if this patient is premenopausal.   Electronically Signed   By: Lars Pinks M.D.   On: 11/10/2013 09:11    Review of Systems  Constitutional: Negative.   HENT: Negative.   Eyes: Negative.   Respiratory: Negative.   Cardiovascular: Negative.   Gastrointestinal: Positive for heartburn, nausea, vomiting, abdominal pain and diarrhea. Negative for constipation, blood in stool and melena.  Genitourinary: Negative.   Musculoskeletal: Negative.   Skin: Negative.   Neurological: Negative.   Endo/Heme/Allergies: Negative.   Psychiatric/Behavioral: Negative.    Blood pressure 111/57, pulse 56, temperature 98.5 F (36.9 C), temperature source Oral, resp. rate 16, height 5' 6"  (1.676 m), weight 80.74 kg (178 lb), last menstrual period 11/10/2013, SpO2 97.00%. Physical Exam  Constitutional: She is oriented to person, place, and time. She appears well-developed. She appears distressed (she hurts to move.).  HENT:  Head: Normocephalic and atraumatic.  Nose: Nose normal.  Eyes: Conjunctivae and EOM are normal. Pupils are equal, round, and reactive to light. Right eye exhibits no discharge. Left eye exhibits no discharge. No scleral icterus.  Neck: Normal range of motion. Neck supple. JVD present. No tracheal deviation present. No thyromegaly present.  Cardiovascular: Normal rate, regular rhythm, normal heart sounds and intact distal pulses.  Exam reveals no gallop.   No murmur heard. Respiratory: Effort normal and breath sounds normal. No respiratory distress. She has no wheezes. She has no rales. She exhibits no tenderness.  GI: Soft. Bowel sounds are normal. She exhibits no distension and no mass. There is tenderness (LLQ, no one focal point.  says it hurts to move and wants to stay on her left side.). There is no rebound  and no guarding.  She has some laparoscopic scars, but no other incisions on her abdomen.  Musculoskeletal: She exhibits no edema.  Lymphadenopathy:    She has no cervical adenopathy.  Neurological: She is alert and oriented to person, place, and time. A cranial nerve deficit is present.  Skin: Skin is warm and dry. No rash noted. She is not diaphoretic. No erythema. No pallor.  Psychiatric: She has a normal mood and affect. Her behavior is normal. Judgment and thought content normal.    Assessment/Plan: 1.  Abdominal pain LLQ 2.  History of Gastric bypass with ulcer.  2 additional         surgeries to remove, "scar tissue," since her initial bypass surgery 01/2012.  She reports 175 pound loss since bypass procedure. 3.  Ulcers by EGD after Bypass surgery, on PPI and       Carafate for over 1 year.  She was to have another EGD, but has  not followed thru on this so far. 4.  Anemia   Plan:  Her pain seems way out of proportion to her current CT or lab findings. Dr. Marcello Moores will see and evaluate ASAP. I have talked with Dr. Stark Jock about exam also.  Earnstine Regal 11/10/2013, 10:40 AM

## 2013-11-10 NOTE — ED Notes (Signed)
Dr. Ranae PalmsYelverton made aware of pt's response to pain medication.

## 2014-02-22 ENCOUNTER — Other Ambulatory Visit: Payer: Self-pay | Admitting: Obstetrics and Gynecology

## 2014-02-23 LAB — CYTOLOGY - PAP

## 2014-03-05 ENCOUNTER — Other Ambulatory Visit (HOSPITAL_COMMUNITY): Payer: Self-pay | Admitting: Obstetrics and Gynecology

## 2014-03-05 DIAGNOSIS — Z8742 Personal history of other diseases of the female genital tract: Secondary | ICD-10-CM

## 2014-04-08 ENCOUNTER — Ambulatory Visit (HOSPITAL_COMMUNITY)
Admission: RE | Admit: 2014-04-08 | Discharge: 2014-04-08 | Disposition: A | Discharge status: Home / Self Care | Payer: 59 | Source: Ambulatory Visit | Attending: Obstetrics and Gynecology | Admitting: Obstetrics and Gynecology

## 2014-04-08 DIAGNOSIS — N971 Female infertility of tubal origin: Secondary | ICD-10-CM | POA: Insufficient documentation

## 2014-04-08 DIAGNOSIS — Z8742 Personal history of other diseases of the female genital tract: Secondary | ICD-10-CM | POA: Insufficient documentation

## 2014-04-08 MED ORDER — IOHEXOL 300 MG/ML  SOLN
20.0000 mL | Freq: Once | INTRAMUSCULAR | Status: AC | PRN
Start: 2014-04-08 — End: 2014-04-08
  Administered 2014-04-08: 20 mL

## 2014-09-01 ENCOUNTER — Encounter: Payer: Self-pay | Admitting: Internal Medicine

## 2014-09-01 ENCOUNTER — Other Ambulatory Visit (INDEPENDENT_AMBULATORY_CARE_PROVIDER_SITE_OTHER): Payer: 59

## 2014-09-01 ENCOUNTER — Ambulatory Visit (INDEPENDENT_AMBULATORY_CARE_PROVIDER_SITE_OTHER): Payer: 59 | Admitting: Internal Medicine

## 2014-09-01 VITALS — BP 124/80 | HR 60 | Temp 98.5°F | Resp 16 | Ht 66.0 in | Wt 164.0 lb

## 2014-09-01 DIAGNOSIS — Z9884 Bariatric surgery status: Secondary | ICD-10-CM | POA: Diagnosis not present

## 2014-09-01 DIAGNOSIS — D509 Iron deficiency anemia, unspecified: Secondary | ICD-10-CM

## 2014-09-01 DIAGNOSIS — K645 Perianal venous thrombosis: Secondary | ICD-10-CM

## 2014-09-01 LAB — COMPREHENSIVE METABOLIC PANEL
ALT: 15 U/L (ref 0–35)
AST: 18 U/L (ref 0–37)
Albumin: 4.1 g/dL (ref 3.5–5.2)
Alkaline Phosphatase: 92 U/L (ref 39–117)
BUN: 13 mg/dL (ref 6–23)
CALCIUM: 9.6 mg/dL (ref 8.4–10.5)
CO2: 28 meq/L (ref 19–32)
Chloride: 106 mEq/L (ref 96–112)
Creatinine, Ser: 0.84 mg/dL (ref 0.40–1.20)
GFR: 92.96 mL/min (ref >60.00)
GLUCOSE: 92 mg/dL (ref 70–99)
Potassium: 4.1 mEq/L (ref 3.5–5.1)
Sodium: 137 mEq/L (ref 135–145)
TOTAL PROTEIN: 7.4 g/dL (ref 6.0–8.3)
Total Bilirubin: 0.8 mg/dL (ref 0.2–1.2)

## 2014-09-01 LAB — CBC WITH DIFFERENTIAL/PLATELET
Basophils Absolute: 0 10*3/uL (ref 0.0–0.1)
Basophils Relative: 0.7 % (ref 0.0–3.0)
EOS ABS: 0.1 10*3/uL (ref 0.0–0.7)
Eosinophils Relative: 1.9 % (ref 0.0–5.0)
HCT: 34.2 % — ABNORMAL LOW (ref 36.0–46.0)
Hemoglobin: 11.1 g/dL — ABNORMAL LOW (ref 12.0–15.0)
LYMPHS PCT: 46.9 % — AB (ref 12.0–46.0)
Lymphs Abs: 1.6 10*3/uL (ref 0.7–4.0)
MCHC: 32.5 g/dL (ref 30.0–36.0)
MCV: 70.8 fl — ABNORMAL LOW (ref 78.0–100.0)
MONO ABS: 0.2 10*3/uL (ref 0.1–1.0)
Monocytes Relative: 6 % (ref 3.0–12.0)
NEUTROS ABS: 1.5 10*3/uL (ref 1.4–7.7)
Neutrophils Relative %: 44.5 % (ref 43.0–77.0)
Platelets: 280 10*3/uL (ref 150.0–400.0)
RBC: 4.84 Mil/uL (ref 3.87–5.11)
RDW: 20.5 % — ABNORMAL HIGH (ref 11.5–15.5)
WBC: 3.5 10*3/uL — ABNORMAL LOW (ref 4.0–10.5)

## 2014-09-01 LAB — FERRITIN: Ferritin: 2.9 ng/mL — ABNORMAL LOW (ref 10.0–291.0)

## 2014-09-01 LAB — VITAMIN D 25 HYDROXY (VIT D DEFICIENCY, FRACTURES): VITD: 35.22 ng/mL (ref 30.00–100.00)

## 2014-09-01 LAB — IBC PANEL
IRON: 19 ug/dL — AB (ref 42–145)
Saturation Ratios: 4.3 % — ABNORMAL LOW (ref 20.0–50.0)
Transferrin: 317 mg/dL (ref 212.0–360.0)

## 2014-09-01 LAB — VITAMIN B12: Vitamin B-12: 454 pg/mL (ref 211–911)

## 2014-09-01 LAB — FOLATE: Folate: >23.3 ng/mL (ref >5.9)

## 2014-09-01 NOTE — Progress Notes (Signed)
Pre visit review using our clinic review tool, if applicable. No additional management support is needed unless otherwise documented below in the visit note. 

## 2014-09-01 NOTE — Patient Instructions (Signed)
Sitz Bath °A sitz bath is a warm water bath taken in the sitting position that covers only the hips and buttocks. It may be used for either healing or hygiene purposes. Sitz baths are also used to relieve pain, itching, or muscle spasms. The water may contain medicine. Moist heat will help you heal and relax.  °HOME CARE INSTRUCTIONS  °Take 3 to 4 sitz baths a day. °1. Fill the bathtub half full with warm water. °2. Sit in the water and open the drain a little. °3. Turn on the warm water to keep the tub half full. Keep the water running constantly. °4. Soak in the water for 15 to 20 minutes. °5. After the sitz bath, pat the affected area dry first. °SEEK MEDICAL CARE IF:  °You get worse instead of better. Stop the sitz baths if you get worse. °MAKE SURE YOU: °· Understand these instructions. °· Will watch your condition. °· Will get help right away if you are not doing well or get worse. °Document Released: 03/03/2004 Document Revised: 03/05/2012 Document Reviewed: 09/08/2010 °ExitCare® Patient Information ©2015 [employer]. This information is not intended to replace advice given to you by your health care provider. Make sure you discuss any questions you have with your health care provider. ° °

## 2014-09-02 DIAGNOSIS — K645 Perianal venous thrombosis: Secondary | ICD-10-CM | POA: Insufficient documentation

## 2014-09-02 NOTE — Assessment & Plan Note (Signed)
Successful I and D today She will keep the area clean and will do Sitz baths

## 2014-09-02 NOTE — Progress Notes (Signed)
Subjective:    Patient ID: Michelle Molina, female    DOB: [birth year], 49 y.o.   MRN: [id number]  Anemia Presents for follow-up visit. There has been no abdominal pain, anorexia, bruising/bleeding easily, confusion, fever, leg swelling, light-headedness, malaise/fatigue, pallor, palpitations, paresthesias, pica or weight loss. Signs of blood loss that are not present include hematemesis, hematochezia, melena, menorrhagia and vaginal bleeding. Past treatments include oral iron supplements, oral vitamin B12 and changes in diet. There are no compliance problems.       Review of Systems  Constitutional: Negative.  Negative for fever, chills, weight loss, malaise/fatigue, diaphoresis, appetite change and fatigue.  HENT: Negative.   Eyes: Negative.   Respiratory: Negative.  Negative for cough, choking, chest tightness, shortness of breath and stridor.   Cardiovascular: Negative.  Negative for chest pain, palpitations and leg swelling.  Gastrointestinal: Positive for rectal pain. Negative for nausea, vomiting, abdominal pain, diarrhea, constipation, blood in stool, melena, hematochezia, anal bleeding, anorexia and hematemesis.  Endocrine: Negative.   Genitourinary: Negative.  Negative for vaginal bleeding and menorrhagia.  Musculoskeletal: Negative.   Skin: Negative.  Negative for pallor.  Allergic/Immunologic: Negative.   Neurological: Negative.  Negative for dizziness, weakness, light-headedness, numbness and paresthesias.  Hematological: Negative.  Negative for adenopathy. Does not bruise/bleed easily.  Psychiatric/Behavioral: Negative.  Negative for confusion.       Objective:   Physical Exam  Constitutional: She is oriented to person, place, and time. She appears well-developed and well-nourished. No distress.  HENT:  Head: Normocephalic and atraumatic.  Mouth/Throat: Oropharynx is clear and moist. No oropharyngeal exudate.  Eyes: Conjunctivae are normal. Right eye exhibits no  discharge. Left eye exhibits no discharge. No scleral icterus.  Neck: Normal range of motion. Neck supple. No JVD present. No tracheal deviation present. No thyromegaly present.  Cardiovascular: Normal rate, regular rhythm, normal heart sounds and intact distal pulses.  Exam reveals no gallop and no friction rub.   No murmur heard. Pulmonary/Chest: Effort normal and breath sounds normal. No stridor. No respiratory distress. She has no wheezes. She has no rales. She exhibits no tenderness.  Abdominal: Soft. Bowel sounds are normal. She exhibits no distension and no mass. There is no tenderness. There is no rebound and no guarding.  Genitourinary: Rectal exam shows external hemorrhoid. Rectal exam shows no internal hemorrhoid, no fissure, no mass, no tenderness and anal tone normal. Guaiac negative stool.     The hemorrhoidal area was cleaned with betadine then local anesthesia was obtained with 1.5 cc plain marcaine 0.5% . Next an 11 blade was used to open the hemorrhoid and a large amount of coagulated blood clot was easily removed. There was no additional bleeding, she tolerated this well with no complications.  Musculoskeletal: Normal range of motion. She exhibits no edema or tenderness.  Lymphadenopathy:    She has no cervical adenopathy.  Neurological: She is oriented to person, place, and time.  Skin: Skin is warm and dry. No rash noted. She is not diaphoretic. No erythema. No pallor.  Vitals reviewed.   Lab Results  Component Value Date   WBC 3.5* 09/01/2014   HGB 11.1* 09/01/2014   HCT 34.2* 09/01/2014   PLT 280.0 09/01/2014   GLUCOSE 92 09/01/2014   CHOL 131 12/08/2012   TRIG 50.0 12/08/2012   HDL 48.70 12/08/2012   LDLCALC 72 12/08/2012   ALT 15 09/01/2014   AST 18 09/01/2014   NA 137 09/01/2014   K 4.1 09/01/2014   CL 106 09/01/2014  CREATININE 0.84 09/01/2014   BUN 13 09/01/2014   CO2 28 09/01/2014   TSH 1.49 06/16/2013   HGBA1C 5.3 12/08/2012        Assessment  & Plan:

## 2014-09-02 NOTE — Assessment & Plan Note (Signed)
I will check her labs for vitamin deficiencies and complications

## 2014-09-02 NOTE — Assessment & Plan Note (Signed)
I will recheck her CBC and her iron levels, will advise further if needed

## 2014-09-03 ENCOUNTER — Encounter: Payer: Self-pay | Admitting: Internal Medicine

## 2014-09-03 ENCOUNTER — Other Ambulatory Visit: Payer: Self-pay | Admitting: Internal Medicine

## 2014-09-03 DIAGNOSIS — D509 Iron deficiency anemia, unspecified: Secondary | ICD-10-CM

## 2014-09-03 LAB — ZINC: Zinc: 64 ug/dL (ref 60–130)

## 2014-09-03 MED ORDER — FERROUS SULFATE 325 (65 FE) MG PO TABS: 325.0000 mg | ORAL_TABLET | Freq: Every day | ORAL | Status: DC

## 2014-09-04 LAB — VITAMIN B1: Vitamin B1 (Thiamine): 27 nmol/L (ref 8–30)

## 2014-09-04 LAB — VITAMIN B6: VITAMIN B6: 28.4 ng/mL — AB (ref 2.1–21.7)

## 2014-09-05 ENCOUNTER — Encounter: Payer: Self-pay | Admitting: Internal Medicine

## 2014-09-08 ENCOUNTER — Telehealth: Payer: Self-pay | Admitting: *Deleted

## 2014-09-08 NOTE — Telephone Encounter (Signed)
[facility] - Client TELEPHONE ADVICE RECORD [facility] Patient Name: Michelle Molina Gender: Female DOB: [birth year] Age: [age] Y 3 M 15 D Return Phone Number: [phone] (Primary) Address: [address] [place]State[place]Zip: [place] [address] Client [facility] - Client Client Site [facility] Day Physician Sanda LingerJones, Thomas Contact Type Call Call Type Triage / Clinical Relationship To Patient Self Appointment Disposition EMR Appointment Not Necessary Return Phone Number [phone] (Primary) Chief Complaint Blood In Stool Initial Comment Caller states she is experiencing blood coming from anus. She passed a large clot and was bleeding significantly. Recently had a procedure done. PreDisposition Call Doctor Info pasted into Epic No Nurse Assessment Nurse: Shelva MajesticWest, RN, Marchelle FolksAmanda Date/Time Lamount Cohen(Eastern Time): 09/06/2014 3:58:22 PM Confirm and document reason for call. If symptomatic, describe symptoms. ---Caller states she is experiencing blood coming from anus. She passed a large clot and was bleeding significantly. Recently had a procedure done to remove hemorrhoids; large bleeding today while just lying down resting; no pain to the area Has the patient traveled out of the country within the last 30 days? ---Not Applicable Does the patient require triage? ---Yes Related visit to physician within the last 2 weeks? ---N/A Does the PT have any chronic conditions? (i.e. diabetes, asthma, etc.) ---No Did the patient indicate they were pregnant? ---No Guidelines Guideline Title Affirmed Question Affirmed Notes Nurse Date/Time (Eastern Time) Rectal Bleeding Toilet water turned red (Exceptions: turned a little pink, few drops of blood) ChadWest, RN, Marchelle Folksmanda 09/06/2014 4:01:51 PM Disp. Time Lamount Cohen(Eastern Time) Disposition Final User 09/06/2014 4:08:18 PM See Physician within 4 Hours (or PCP triage) Yes Shelva MajesticWest, RN, Marchelle FolksAmanda PLEASE  NOTE: All timestamps contained within this report are represented as [place] Standard Time. CONFIDENTIALTY NOTICE: This fax transmission is intended only for the addressee. It contains information that is legally privileged, confidential or otherwise protected from use or disclosure. If you are not the intended recipient, you are strictly prohibited from reviewing, disclosing, copying using or disseminating any of this information or taking any action in reliance on or regarding this information. If you have received this fax in error, please notify us immediately by telephone so that we can arrange for its return to us. Phone: [phone], Toll-Free: [phone], Fax: [phone] Page: 2 of 2 Call Id: [id number] Caller Understands: Yes Disagree/Comply: Comply Care Advice Given Per Guideline SEE PHYSICIAN WITHIN 4 HOURS (or PCP triage): * IF NO PCP TRIAGE: You need to be seen. Go to _______________ (ED/ [facility] or office if it will be open) within the next 3 or 4 hours. Go sooner if you become worse. CALL BACK IF: * Dizziness occurs * You become worse. CARE ADVICE given per Rectal Bleeding (Adult) guideline. After Care Instructions Given Call Event Type User Date / Time Description Referrals [facility]

## 2014-09-13 ENCOUNTER — Ambulatory Visit: Payer: Self-pay | Admitting: Internal Medicine

## 2014-12-17 ENCOUNTER — Encounter: Payer: Self-pay | Admitting: Internal Medicine

## 2014-12-17 ENCOUNTER — Ambulatory Visit (INDEPENDENT_AMBULATORY_CARE_PROVIDER_SITE_OTHER): Payer: 59 | Admitting: Internal Medicine

## 2014-12-17 ENCOUNTER — Other Ambulatory Visit (INDEPENDENT_AMBULATORY_CARE_PROVIDER_SITE_OTHER): Payer: 59

## 2014-12-17 VITALS — BP 118/70 | HR 60 | Temp 98.4°F | Resp 18 | Wt 167.2 lb

## 2014-12-17 DIAGNOSIS — E049 Nontoxic goiter, unspecified: Secondary | ICD-10-CM

## 2014-12-17 DIAGNOSIS — R51 Headache: Secondary | ICD-10-CM | POA: Diagnosis not present

## 2014-12-17 DIAGNOSIS — D509 Iron deficiency anemia, unspecified: Secondary | ICD-10-CM

## 2014-12-17 DIAGNOSIS — R519 Headache, unspecified: Secondary | ICD-10-CM

## 2014-12-17 LAB — CBC WITH DIFFERENTIAL/PLATELET
Basophils Absolute: 0 10*3/uL (ref 0.0–0.1)
Basophils Relative: 0.6 % (ref 0.0–3.0)
Eosinophils Absolute: 0.1 10*3/uL (ref 0.0–0.7)
Eosinophils Relative: 1.5 % (ref 0.0–5.0)
HEMATOCRIT: 35.6 % — AB (ref 36.0–46.0)
HEMOGLOBIN: 11.3 g/dL — AB (ref 12.0–15.0)
Lymphocytes Relative: 43.8 % (ref 12.0–46.0)
Lymphs Abs: 1.8 10*3/uL (ref 0.7–4.0)
MCHC: 31.6 g/dL (ref 30.0–36.0)
MCV: 69.7 fl — ABNORMAL LOW (ref 78.0–100.0)
MONO ABS: 0.2 10*3/uL (ref 0.1–1.0)
Monocytes Relative: 5.5 % (ref 3.0–12.0)
Neutro Abs: 2 10*3/uL (ref 1.4–7.7)
Neutrophils Relative %: 48.6 % (ref 43.0–77.0)
Platelets: 178 10*3/uL (ref 150.0–400.0)
RBC: 5.11 Mil/uL (ref 3.87–5.11)
RDW: 21.8 % — ABNORMAL HIGH (ref 11.5–15.5)
WBC: 4.1 10*3/uL (ref 4.0–10.5)

## 2014-12-17 LAB — FERRITIN: FERRITIN: 3.1 ng/mL — AB (ref 10.0–291.0)

## 2014-12-17 LAB — IBC PANEL
IRON: 13 ug/dL — AB (ref 42–145)
Saturation Ratios: 2.9 % — ABNORMAL LOW (ref 20.0–50.0)
TRANSFERRIN: 323 mg/dL (ref 212.0–360.0)

## 2014-12-17 LAB — TSH: TSH: 1.25 u[IU]/mL (ref 0.35–4.50)

## 2014-12-17 NOTE — Patient Instructions (Signed)
  Your next office appointment will be determined based upon review of your pending labs. Those written interpretation of the lab results and instructions will be transmitted to you by My Chart  Critical results will be called.   Followup as needed for any active or acute issue. Please report any significant change in your symptoms. 

## 2014-12-17 NOTE — Progress Notes (Signed)
   Subjective:    Patient ID: Michelle Molina, female    DOB: April 14, 1966, 49 y.o.   MRN: [id number]  HPI  Her symptoms began 2 weeks ago upon awakening as a diffuse, generalized headache described as dull up to a level III. This has persisted 24/7 since. She thought it might be related to increasing meat in the form of chicken in her diet.  She also describes sharp right superior occipital area pain "as if hit" up to level X lasting 2-3 minutes. This is nonradiating. It is also associated with some discomfort below the left eyebrow.  She has found no factors which improve or exacerbate the headache. Tylenol has not been of benefit. She is unable to take non-steroidal's. Percocet causes itching.  She had similar headaches in 2008 which resolved after her Gynecologist had resected a fibroid which was causing severe anemia.  In March of this year iron level was 19; ferritin 2.9; and hematocrit 34.2. She is on oral iron. She has had gastric bypass surgery. She denies any bleeding dyscrasias. She describes pica as she craves ice .   Review of Systems Fever, chills, or sweats not present. Mental status change or memory loss denied. Blurred vision , diplopia or vision loss absent. Vertigo, near syncope or imbalance denied. There is no numbness, tingling, or weakness in extremities.   No loss of control of bladder or bowels. Radicular type pain absent. No seizure stigmata. Epistaxis, hemoptysis, hematuria, melena, or rectal bleeding denied. No unexplained weight loss, significant dyspepsia,dysphagia, or abdominal pain.  There is no abnormal bruising , bleeding, or difficulty stopping bleeding with injury.        Objective:   Physical Exam  Pertinent or positive findings include: There is erythema of the nares. The thyroid is asymmetrically enlarged, the right lobe is greater than left. Heart rate is slow. She is wearing extremely long false nails. She has crepitus of the knees. There is  excess tissue in the triceps area bilaterally.  General appearance :adequately nourished; in no distress.  Eyes: No conjunctival inflammation or scleral icterus is present.EOM & FOV intact  Oral exam:  Lips and gums are healthy appearing.There is no oropharyngeal erythema or exudate noted. Dental hygiene is good.  Heart:  Normal rate and regular rhythm. S1 and S2 normal without gallop, murmur, click, rub or other extra sounds    Lungs:Chest clear to auscultation; no wheezes, rhonchi,rales ,or rubs present.No increased work of breathing.   Abdomen: bowel sounds normal, soft and non-tender without masses, organomegaly or hernias noted.  No guarding or rebound.   Vascular : all pulses equal ; no bruits present.  Skin:Warm & dry.  Intact without suspicious lesions or rashes ; no tenting  Lymphatic: No lymphadenopathy is noted about the head, neck, axilla  Neuro: Strength, tone & DTRs normal.Cranial nerve exam WNL         Assessment & Plan:  #1 generalized headaches; this is most likely related to severe anemia.  #2 iron deficiency anemia, probably progressive. This is most likely due to suboptimal absorption of iron following her gastric bypass surgery  #3 possible goiter  Plan: See orders and recommendations

## 2014-12-17 NOTE — Progress Notes (Signed)
Pre visit review using our clinic review tool, if applicable. No additional management support is needed unless otherwise documented below in the visit note. 

## 2014-12-19 ENCOUNTER — Other Ambulatory Visit: Payer: Self-pay | Admitting: Internal Medicine

## 2014-12-19 ENCOUNTER — Encounter: Payer: Self-pay | Admitting: Internal Medicine

## 2014-12-19 DIAGNOSIS — D509 Iron deficiency anemia, unspecified: Secondary | ICD-10-CM

## 2014-12-20 NOTE — Telephone Encounter (Signed)
Please advise 

## 2014-12-22 ENCOUNTER — Telehealth: Payer: Self-pay | Admitting: Hematology and Oncology

## 2014-12-22 NOTE — Telephone Encounter (Signed)
new patient appt-s/w patient and gave np appt for 06/30 @ 12:30 w/Dr. Pamelia HoitGudena. Referring dr. Marga MelnickWilliam Hopper Dx- Anemia; iron deficiency

## 2014-12-23 ENCOUNTER — Ambulatory Visit (HOSPITAL_BASED_OUTPATIENT_CLINIC_OR_DEPARTMENT_OTHER): Payer: 59 | Admitting: Hematology and Oncology

## 2014-12-23 ENCOUNTER — Ambulatory Visit (HOSPITAL_BASED_OUTPATIENT_CLINIC_OR_DEPARTMENT_OTHER): Payer: 59

## 2014-12-23 ENCOUNTER — Telehealth: Payer: Self-pay | Admitting: Hematology and Oncology

## 2014-12-23 ENCOUNTER — Encounter: Payer: Self-pay | Admitting: Hematology and Oncology

## 2014-12-23 VITALS — BP 127/87 | HR 68 | Temp 98.5°F | Resp 18 | Ht 66.0 in | Wt 172.9 lb

## 2014-12-23 DIAGNOSIS — D509 Iron deficiency anemia, unspecified: Secondary | ICD-10-CM

## 2014-12-23 NOTE — Telephone Encounter (Signed)
Gave avs & calendar for July/August. °

## 2014-12-23 NOTE — Progress Notes (Signed)
[facility] CONSULT NOTE  Patient Care Team: Etta Grandchildhomas L Jones, MD as PCP - General (Internal Medicine)  CHIEF COMPLAINTS/PURPOSE OF CONSULTATION:  Iron deficiency anemia  HISTORY OF PRESENTING ILLNESS:  Michelle Molina 49 y.o. female is here because of chronic iron deficiency anemia. Patient was originally found to be iron deficient in 2010 when she was found to have severe bleeding from her hemorrhoids. Her gynecologist was able to do myomectomy in December 2010 and after that her hemoglobin had remained stable. She had remained on oral iron therapy ever since. She has had since 2013 recurrent severe headaches which come and go intermittently. She had gastric bypass surgery in 2013 as well. She reports that she had extensive complications after this including multiple colonic problems. Finally she recovered from all of that but continued to be iron deficient. She was continued on oral iron ferrous sulfate once a day. Most recently she had one episode of severe dizziness lightheadedness. She had blood work done by Dr. Yetta BarreJones which revealed severely low ferritin levels. Because of gastric bypass she lost 180 pounds. She has been referred to us for discussion of pros and cons of intravenous iron therapy  I reviewed her records extensively and collaborated the history with the patient.  MEDICAL HISTORY:  Past Medical History  Diagnosis Date  . Ulcer   . PONV (postoperative nausea and vomiting)     SURGICAL HISTORY: Past Surgical History  Procedure Laterality Date  . Cesarean section  1997, 1999    x2  . Tubal ligation  2002  . Cholecystectomy  1997  . Myomectomy  2010  . Knee surgery  2001    left  . Hysteroscopy  2006  . Gastric bypass  2013    SOCIAL HISTORY: History   Social History  . Marital Status: Divorced    Spouse Name: N/A  . Number of Children: 2  . Years of Education: 16   Occupational History  . [employer] Unemployed   Social  History Main Topics  . Smoking status: Never Smoker   . Smokeless tobacco: Never Used  . Alcohol Use: No     Comment: occasionally  . Drug Use: No  . Sexual Activity: Yes    Birth Control/ Protection: Condom   Other Topics Concern  . Not on file   Social History Narrative   Regular exercise-yes   Caffeine Use-no    FAMILY HISTORY: Family History  Problem Relation Age of Onset  . Uterine cancer Mother   . Hypertension Mother   . Hyperlipidemia Mother   . Breast cancer Maternal Aunt   . Lung cancer Maternal Uncle   . Diabetes Maternal Grandfather   . Hyperlipidemia Maternal Grandfather   . Stroke Maternal Grandfather     ALLERGIES:  is allergic to aspirin; ibuprofen; and percocet.  MEDICATIONS:  Current Outpatient Prescriptions  Medication Sig Dispense Refill  . acetaminophen (TYLENOL) 500 MG tablet Take 1,000 mg by mouth every 6 (six) hours as needed for mild pain.    . Calcium 600-400 MG-UNIT CHEW Chew 1 tablet by mouth 2 (two) times daily.    . Cholecalciferol (VITAMIN D3) 1000 UNITS CAPS Take by mouth.    . Cyanocobalamin (B-12 PO) Take by mouth.    . ferrous sulfate (FEOSOL) 325 (65 FE) MG tablet Take 1 tablet (325 mg total) by mouth daily with breakfast. 60 tablet 11  . GARCINIA CAMBOGIA-CHROMIUM PO Take 1,000 mg by mouth 2 (two) times daily.    .Marland Kitchen  LUTEIN PO Take by mouth.    . Multiple Vitamin (MULITIVITAMIN WITH MINERALS) TABS Take 1 tablet by mouth 2 (two) times daily.     . Omega-3 Fatty Acids (FISH OIL) 1000 MG CAPS Take 1 g by mouth.     No current facility-administered medications for this visit.    REVIEW OF SYSTEMS:   Constitutional: Denies fevers, chills or abnormal night sweats Eyes: Denies blurriness of vision, double vision or watery eyes Ears, nose, mouth, throat, and face: Denies mucositis or sore throat Respiratory: Denies cough, dyspnea or wheezes Cardiovascular: Denies palpitation, chest discomfort or lower extremity  swelling Gastrointestinal:  Denies nausea, heartburn or change in bowel habits Skin: Denies abnormal skin rashes Lymphatics: Denies new lymphadenopathy or easy bruising Neurological:Denies numbness, tingling or new weaknesses Behavioral/Psych: Mood is stable, no new changes  All other systems were reviewed with the patient and are negative.  PHYSICAL EXAMINATION: ECOG PERFORMANCE STATUS: 1 - Symptomatic but completely ambulatory  Filed Vitals:   12/23/14 1241  BP: 127/87  Pulse: 68  Temp: 98.5 F (36.9 C)  Resp: 18   Filed Weights   12/23/14 1241  Weight: 172 lb 14.4 oz (78.427 kg)    GENERAL:alert, no distress and comfortable SKIN: skin color, texture, turgor are normal, no rashes or significant lesions EYES: normal, conjunctiva are pink and non-injected, sclera clear OROPHARYNX:no exudate, no erythema and lips, buccal mucosa, and tongue normal  NECK: supple, thyroid normal size, non-tender, without nodularity LYMPH:  no palpable lymphadenopathy in the cervical, axillary or inguinal LUNGS: clear to auscultation and percussion with normal breathing effort HEART: regular rate & rhythm and no murmurs and no lower extremity edema ABDOMEN:abdomen soft, non-tender and normal bowel sounds Musculoskeletal:no cyanosis of digits and no clubbing  PSYCH: alert & oriented x 3 with fluent speech NEURO: no focal motor/sensory deficits  LABORATORY DATA:  I have reviewed the data as listed Lab Results  Component Value Date   WBC 4.1 12/17/2014   HGB 11.3* 12/17/2014   HCT 35.6* 12/17/2014   MCV 69.7 Repeated and verified X2.* 12/17/2014   PLT 178.0 12/17/2014   Lab Results  Component Value Date   NA 137 09/01/2014   K 4.1 09/01/2014   CL 106 09/01/2014   CO2 28 09/01/2014    RADIOGRAPHIC STUDIES: I have personally reviewed the radiological reports and agreed with the findings in the report.  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 Patient has prior history of iron deficiency anemia due to fibroids causing heavy menstrual bleeding which resolved after myomectomy in 2010. Gastric bypass surgery done 2013  Prior treatment: Patient had been on oral iron supplementation since 2010 but without adequate response Iron deficiency symptoms: Headaches, recent dizziness lightheadedness, fatigue  Recommendation: 1. Intravenous iron therapy with Feraheme 2 doses 2. Recheck CBC and iron studies one month after finishing IV iron 3. After that we will check her every 3 months for CBC and iron studies and if she remains stable we can decrease the frequency of blood count checks.  First IV iron infusion will be given next Friday because she is off on Fridays. I counseled her about risks of IV iron therapy including the risk of allergy and bone pain.  Return to clinic about a month after completion of IV iron to recheck blood counts. Patient reports that she has sickle cell trait I would like to check hemoglobin electrophoresis to determine the percentage  of sickling.  Thank you very much for your consultation  All questions were answered. The patient knows to call the clinic with any problems, questions or concerns.    Sabas Sous, MD 1:38 PM

## 2014-12-23 NOTE — Assessment & Plan Note (Addendum)
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 Patient has prior history of iron deficiency anemia due to fibroids causing heavy menstrual bleeding which resolved after myomectomy in 2010. Gastric bypass surgery done 2013  Prior treatment: Patient had been on oral iron supplementation since 2010 but without adequate response Iron deficiency symptoms: Headaches, recent dizziness lightheadedness, fatigue  Recommendation: 1. Intravenous iron therapy with Feraheme 2 doses 2. Recheck CBC and iron studies one month after finishing IV iron 3. After that we will check her every 3 months for CBC and iron studies and if she remains stable we can decrease the frequency of blood count checks.  First IV iron infusion will be given next Friday because she is off on Fridays. I counseled her about risks of IV iron therapy including the risk of allergy and bone pain.  Return to clinic about a month after completion of IV iron to recheck blood counts. Patient reports that she has sickle cell trait I would like to check hemoglobin electrophoresis to determine the percentage of sickling.  Thank you very much for your consultation

## 2014-12-31 ENCOUNTER — Ambulatory Visit (HOSPITAL_BASED_OUTPATIENT_CLINIC_OR_DEPARTMENT_OTHER): Payer: 59

## 2014-12-31 VITALS — BP 99/66 | HR 64 | Temp 98.0°F | Resp 18

## 2014-12-31 DIAGNOSIS — D509 Iron deficiency anemia, unspecified: Secondary | ICD-10-CM | POA: Diagnosis not present

## 2014-12-31 MED ORDER — SODIUM CHLORIDE 0.9 % IV SOLN
Freq: Once | INTRAVENOUS | Status: AC
  Administered 2014-12-31: 14:00:00 via INTRAVENOUS

## 2014-12-31 MED ORDER — SODIUM CHLORIDE 0.9 % IV SOLN
510.0000 mg | Freq: Once | INTRAVENOUS | Status: AC
  Administered 2014-12-31: 510 mg via INTRAVENOUS
  Filled 2014-12-31: qty 17

## 2014-12-31 NOTE — Patient Instructions (Signed)

## 2015-01-07 ENCOUNTER — Ambulatory Visit (HOSPITAL_BASED_OUTPATIENT_CLINIC_OR_DEPARTMENT_OTHER): Payer: 59

## 2015-01-07 VITALS — BP 109/70 | HR 63 | Temp 98.2°F | Resp 18

## 2015-01-07 DIAGNOSIS — D509 Iron deficiency anemia, unspecified: Secondary | ICD-10-CM

## 2015-01-07 MED ORDER — SODIUM CHLORIDE 0.9 % IV SOLN
Freq: Once | INTRAVENOUS | Status: AC
  Administered 2015-01-07: 14:00:00 via INTRAVENOUS

## 2015-01-07 MED ORDER — SODIUM CHLORIDE 0.9 % IV SOLN
510.0000 mg | Freq: Once | INTRAVENOUS | Status: AC
  Administered 2015-01-07: 510 mg via INTRAVENOUS
  Filled 2015-01-07: qty 17

## 2015-01-07 NOTE — Patient Instructions (Signed)

## 2015-02-01 ENCOUNTER — Telehealth: Payer: Self-pay

## 2015-02-01 DIAGNOSIS — D509 Iron deficiency anemia, unspecified: Secondary | ICD-10-CM

## 2015-02-01 NOTE — Telephone Encounter (Signed)
Lab orders placed.  Opened in wrong encounter type

## 2015-02-02 ENCOUNTER — Other Ambulatory Visit (HOSPITAL_BASED_OUTPATIENT_CLINIC_OR_DEPARTMENT_OTHER): Payer: 59

## 2015-02-02 DIAGNOSIS — D509 Iron deficiency anemia, unspecified: Secondary | ICD-10-CM | POA: Diagnosis not present

## 2015-02-02 LAB — CBC WITH DIFFERENTIAL/PLATELET
BASO%: 1 % (ref 0.0–2.0)
Basophils Absolute: 0 10*3/uL (ref 0.0–0.1)
EOS ABS: 0.2 10*3/uL (ref 0.0–0.5)
EOS%: 5.4 % (ref 0.0–7.0)
HCT: 37.2 % (ref 34.8–46.6)
HGB: 12.2 g/dL (ref 11.6–15.9)
LYMPH%: 46.8 % (ref 14.0–49.7)
MCH: 24.9 pg — ABNORMAL LOW (ref 25.1–34.0)
MCHC: 32.8 g/dL (ref 31.5–36.0)
MCV: 76.1 fL — AB (ref 79.5–101.0)
MONO#: 0.2 10*3/uL (ref 0.1–0.9)
MONO%: 6.4 % (ref 0.0–14.0)
NEUT%: 40.4 % (ref 38.4–76.8)
NEUTROS ABS: 1.2 10*3/uL — AB (ref 1.5–6.5)
NRBC: 0 % (ref 0–0)
Platelets: 160 10*3/uL (ref 145–400)
RBC: 4.89 10*6/uL (ref 3.70–5.45)
RDW: 24.2 % — ABNORMAL HIGH (ref 11.2–14.5)
WBC: 3 10*3/uL — ABNORMAL LOW (ref 3.9–10.3)
lymph#: 1.4 10*3/uL (ref 0.9–3.3)

## 2015-02-02 LAB — IRON AND TIBC CHCC
%SAT: 30 % (ref 21–57)
Iron: 74 ug/dL (ref 41–142)
TIBC: 249 ug/dL (ref 236–444)
UIBC: 174 ug/dL (ref 120–384)

## 2015-02-02 LAB — FERRITIN CHCC: Ferritin: 50 ng/ml (ref 9–269)

## 2015-02-03 ENCOUNTER — Telehealth: Payer: Self-pay | Admitting: Hematology and Oncology

## 2015-02-03 ENCOUNTER — Encounter: Payer: Self-pay | Admitting: Hematology and Oncology

## 2015-02-03 ENCOUNTER — Ambulatory Visit (HOSPITAL_BASED_OUTPATIENT_CLINIC_OR_DEPARTMENT_OTHER): Payer: 59 | Admitting: Hematology and Oncology

## 2015-02-03 VITALS — BP 114/83 | HR 58 | Temp 98.3°F | Resp 18 | Ht 66.0 in | Wt 175.8 lb

## 2015-02-03 DIAGNOSIS — D509 Iron deficiency anemia, unspecified: Secondary | ICD-10-CM

## 2015-02-03 DIAGNOSIS — D709 Neutropenia, unspecified: Secondary | ICD-10-CM | POA: Diagnosis not present

## 2015-02-03 DIAGNOSIS — K909 Intestinal malabsorption, unspecified: Secondary | ICD-10-CM | POA: Diagnosis not present

## 2015-02-03 NOTE — Progress Notes (Signed)
Patient Care Team: Etta Grandchild, MD as PCP - General (Internal Medicine)  DIAGNOSIS:  1. 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 IV iron treatment 12/31/2014 2. Neutropenia: Unclear etiology probably cyclical being observed  CHIEF COMPLIANT: Follow-up of iron deficiency anemia after IV iron treatment  INTERVAL HISTORY: Michelle Molina is a 49 year old lady with above-mentioned history of 5 deficiency anemia due to 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 She had lost significant amount of weight from over 400 pounds 280 pounds. She is continuing to try very hard to lose weight. She is now taking Hydroxycut. She was craving ice and was found to have a ferritin of 3 and received IV iron treatment and her ferritin went up to 50. She no longer craves ice.  REVIEW OF SYSTEMS:   Constitutional: Denies fevers, chills or abnormal weight loss Eyes: Denies blurriness of vision Ears, nose, mouth, throat, and face: Denies mucositis or sore throat Respiratory: Denies cough, dyspnea or wheezes Cardiovascular: Denies palpitation, chest discomfort or lower extremity swelling Gastrointestinal:  Denies nausea, heartburn or change in bowel habits Skin: Denies abnormal skin rashes Lymphatics: Denies new lymphadenopathy or easy bruising Neurological:Denies numbness, tingling or new weaknesses Behavioral/Psych: Mood is stable, no new changes  All other systems were reviewed with the patient and are negative.  I have reviewed the past medical history, past surgical history, social history and family history with the patient and they are unchanged from previous note.  ALLERGIES:  is allergic to aspirin; ibuprofen; and percocet.  MEDICATIONS:  Current Outpatient Prescriptions  Medication Sig Dispense Refill  . acetaminophen (TYLENOL) 500 MG tablet Take 1,000 mg by mouth every 6 (six) hours as needed for mild pain.    . Calcium 600-400 MG-UNIT CHEW Chew 1 tablet by mouth 2 (two) times daily.    .  Cholecalciferol (VITAMIN D3) 1000 UNITS CAPS Take by mouth.    Marland Kitchen GARCINIA CAMBOGIA-CHROMIUM PO Take 1,000 mg by mouth 2 (two) times daily.    . LUTEIN PO Take by mouth.    . Multiple Vitamin (MULITIVITAMIN WITH MINERALS) TABS Take 1 tablet by mouth 2 (two) times daily.     . Omega-3 Fatty Acids (FISH OIL) 1000 MG CAPS Take 1 g by mouth.     No current facility-administered medications for this visit.    PHYSICAL EXAMINATION: ECOG PERFORMANCE STATUS: 1 - Symptomatic but completely ambulatory  Filed Vitals:   02/03/15 1008  BP: 114/83  Pulse: 58  Temp: 98.3 F (36.8 C)  Resp: 18   Filed Weights   02/03/15 1008  Weight: 175 lb 12.8 oz (79.742 kg)    GENERAL:alert, no distress and comfortable SKIN: skin color, texture, turgor are normal, no rashes or significant lesions EYES: normal, Conjunctiva are pink and non-injected, sclera clear OROPHARYNX:no exudate, no erythema and lips, buccal mucosa, and tongue normal  NECK: supple, thyroid normal size, non-tender, without nodularity LYMPH:  no palpable lymphadenopathy in the cervical, axillary or inguinal LUNGS: clear to auscultation and percussion with normal breathing effort HEART: regular rate & rhythm and no murmurs and no lower extremity edema ABDOMEN:abdomen soft, non-tender and normal bowel sounds Musculoskeletal:no cyanosis of digits and no clubbing  NEURO: alert & oriented x 3 with fluent speech, no focal motor/sensory deficits  LABORATORY DATA:  I have reviewed the data as listed   Chemistry      Component Value Date/Time   NA 137 09/01/2014 1007   NA 141 06/24/2012 1416   K 4.1 09/01/2014 1007   K  3.7 06/24/2012 1416   CL 106 09/01/2014 1007   CL 110* 06/24/2012 1416   CO2 28 09/01/2014 1007   CO2 23 06/24/2012 1416   BUN 13 09/01/2014 1007   BUN 10 06/24/2012 1416   CREATININE 0.84 09/01/2014 1007   CREATININE 0.95 06/24/2012 1416      Component Value Date/Time   CALCIUM 9.6 09/01/2014 1007   CALCIUM 9.3  06/24/2012 1416   ALKPHOS 92 09/01/2014 1007   ALKPHOS 145* 06/24/2012 1416   AST 18 09/01/2014 1007   AST 21 06/24/2012 1416   ALT 15 09/01/2014 1007   ALT 18 06/24/2012 1416   BILITOT 0.8 09/01/2014 1007   BILITOT 1.0 06/24/2012 1416       Lab Results  Component Value Date   WBC 3.0* 02/02/2015   HGB 12.2 02/02/2015   HCT 37.2 02/02/2015   MCV 76.1* 02/02/2015   PLT 160 02/02/2015   NEUTROABS 1.2* 02/02/2015   ASSESSMENT & PLAN:  Anemia, iron deficiency Iron deficiency anemia most likely due to 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 Patient has prior history of iron deficiency anemia due to fibroids causing heavy menstrual bleeding which resolved after myomectomy in 2010. Gastric bypass surgery done 2013  Prior treatment: Patient had been on oral iron supplementation since 2010 but without adequate response; IV Iron 12/31/2014  Response to IV iron therapy:  Return to clinic in 3 months with labs and follow  Neutropenia Neutropenia of unclear etiology: The only thing she did differently was started hydroxycut weight loss supplement. I do not think there is any clear neutropenic side effects related to that medication. I suggested that she should stop taking it because there are clear warnings from FDA of lack of benefit and side effects. Patient is asymptomatic and it could be a cyclic neutropenia. We will watch and monitor her. She will come back in 3 months for recheck of her blood counts.    Orders Placed This Encounter  Procedures  . CBC with Differential    Standing Status: Future     Number of Occurrences:      Standing Expiration Date: 02/03/2016  . Ferritin    Standing Status: Future     Number of Occurrences:      Standing Expiration Date: 02/03/2016  . Iron and TIBC [facility]    Standing Status: Future     Number of Occurrences:      Standing Expiration Date: 02/03/2016  . Folate RBC    Standing Status: Future     Number of Occurrences:       Standing Expiration Date: 02/03/2016  . Vitamin B12    Standing Status: Future     Number of Occurrences:      Standing Expiration Date: 02/03/2016  . Comprehensive metabolic panel (Cmet) - [facility]    Standing Status: Future     Number of Occurrences:      Standing Expiration Date: 02/03/2016   The patient has a good understanding of the overall plan. she agrees with it. she will call with any problems that may develop before the next visit here.   Sabas Sous, MD

## 2015-02-03 NOTE — Assessment & Plan Note (Signed)
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 Patient has prior history of iron deficiency anemia due to fibroids causing heavy menstrual bleeding which resolved after myomectomy in 2010. Gastric bypass surgery done 2013  Prior treatment: Patient had been on oral iron supplementation since 2010 but without adequate response; IV Iron 12/31/2014  Response to IV iron therapy:  Return to clinic in 3 months with labs and follow

## 2015-02-03 NOTE — Assessment & Plan Note (Signed)
Neutropenia of unclear etiology: The only thing she did differently was started hydroxycut weight loss supplement. I do not think there is any clear neutropenic side effects related to that medication. I suggested that she should stop taking it because there are clear warnings from FDA of lack of benefit and side effects. Patient is asymptomatic and it could be a cyclic neutropenia. We will watch and monitor her. She will come back in 3 months for recheck of her blood counts. 

## 2015-02-03 NOTE — Telephone Encounter (Signed)
Gave avs & calendar for November.  °

## 2015-03-28 ENCOUNTER — Other Ambulatory Visit: Payer: Self-pay | Admitting: Obstetrics and Gynecology

## 2015-03-30 LAB — CYTOLOGY - PAP

## 2015-04-28 ENCOUNTER — Other Ambulatory Visit: Payer: Self-pay

## 2015-05-05 ENCOUNTER — Ambulatory Visit: Payer: Self-pay | Admitting: Hematology and Oncology

## 2015-05-05 NOTE — Assessment & Plan Note (Signed)
Neutropenia of unclear etiology: The only thing she did differently was started hydroxycut weight loss supplement. I do not think there is any clear neutropenic side effects related to that medication. I suggested that she should stop taking it because there are clear warnings from FDA of lack of benefit and side effects. Patient is asymptomatic and it could be a cyclic neutropenia. We will watch and monitor her. She will come back in 3 months for recheck of her blood counts.

## 2015-07-19 ENCOUNTER — Other Ambulatory Visit: Payer: Self-pay | Admitting: Specialist

## 2015-07-19 DIAGNOSIS — R1032 Left lower quadrant pain: Secondary | ICD-10-CM

## 2015-07-19 DIAGNOSIS — K5900 Constipation, unspecified: Secondary | ICD-10-CM

## 2015-07-22 ENCOUNTER — Ambulatory Visit
Admission: RE | Admit: 2015-07-22 | Discharge: 2015-07-22 | Disposition: A | Discharge status: Home / Self Care | Payer: 59 | Source: Ambulatory Visit | Attending: Specialist | Admitting: Specialist

## 2015-07-22 DIAGNOSIS — R1032 Left lower quadrant pain: Secondary | ICD-10-CM

## 2015-07-22 DIAGNOSIS — K5900 Constipation, unspecified: Secondary | ICD-10-CM

## 2015-07-26 ENCOUNTER — Ambulatory Visit
Admission: RE | Admit: 2015-07-26 | Discharge: 2015-07-26 | Disposition: A | Discharge status: Home / Self Care | Payer: 59 | Source: Ambulatory Visit | Attending: Specialist | Admitting: Specialist

## 2015-07-26 DIAGNOSIS — Z9884 Bariatric surgery status: Secondary | ICD-10-CM | POA: Insufficient documentation

## 2015-07-26 DIAGNOSIS — R1032 Left lower quadrant pain: Secondary | ICD-10-CM | POA: Diagnosis present

## 2015-12-26 ENCOUNTER — Encounter (HOSPITAL_COMMUNITY): Payer: Self-pay | Admitting: Neurology

## 2015-12-26 ENCOUNTER — Emergency Department (HOSPITAL_COMMUNITY)
Admission: EM | Admit: 2015-12-26 | Discharge: 2015-12-26 | Disposition: A | Discharge status: Home / Self Care | Payer: Commercial Managed Care - HMO | Attending: Emergency Medicine | Admitting: Emergency Medicine

## 2015-12-26 ENCOUNTER — Emergency Department (HOSPITAL_COMMUNITY): Payer: Commercial Managed Care - HMO

## 2015-12-26 DIAGNOSIS — R0789 Other chest pain: Secondary | ICD-10-CM | POA: Diagnosis not present

## 2015-12-26 DIAGNOSIS — R079 Chest pain, unspecified: Secondary | ICD-10-CM | POA: Diagnosis present

## 2015-12-26 DIAGNOSIS — D649 Anemia, unspecified: Secondary | ICD-10-CM | POA: Insufficient documentation

## 2015-12-26 LAB — BASIC METABOLIC PANEL
ANION GAP: 7 (ref 5–15)
BUN: 8 mg/dL (ref 6–20)
CALCIUM: 9.3 mg/dL (ref 8.9–10.3)
CO2: 24 mmol/L (ref 22–32)
Chloride: 107 mmol/L (ref 101–111)
Creatinine, Ser: 0.91 mg/dL (ref 0.44–1.00)
GFR calc Af Amer: >60 mL/min (ref >60)
GLUCOSE: 101 mg/dL — AB (ref 65–99)
Potassium: 4.2 mmol/L (ref 3.5–5.1)
Sodium: 138 mmol/L (ref 135–145)

## 2015-12-26 LAB — CBC
HEMATOCRIT: 31.6 % — AB (ref 36.0–46.0)
HEMOGLOBIN: 9.9 g/dL — AB (ref 12.0–15.0)
MCH: 23 pg — AB (ref 26.0–34.0)
MCHC: 31.3 g/dL (ref 30.0–36.0)
MCV: 73.5 fL — ABNORMAL LOW (ref 78.0–100.0)
Platelets: 217 10*3/uL (ref 150–400)
RBC: 4.3 MIL/uL (ref 3.87–5.11)
RDW: 16.5 % — AB (ref 11.5–15.5)
WBC: 3.9 10*3/uL — ABNORMAL LOW (ref 4.0–10.5)

## 2015-12-26 LAB — I-STAT TROPONIN, ED: TROPONIN I, POC: 0 ng/mL (ref 0.00–0.08)

## 2015-12-26 MED ORDER — METOCLOPRAMIDE HCL 10 MG PO TABS
10.0000 mg | ORAL_TABLET | Freq: Once | ORAL | Status: AC
  Administered 2015-12-26: 10 mg via ORAL
  Filled 2015-12-26: qty 1

## 2015-12-26 MED ORDER — SODIUM CHLORIDE 0.9 % IV BOLUS (SEPSIS): 1000.0000 mL | Freq: Once | INTRAVENOUS | Status: DC

## 2015-12-26 MED ORDER — DIPHENHYDRAMINE HCL 25 MG PO CAPS
25.0000 mg | ORAL_CAPSULE | Freq: Once | ORAL | Status: AC
  Administered 2015-12-26: 25 mg via ORAL
  Filled 2015-12-26: qty 1

## 2015-12-26 MED ORDER — KETOROLAC TROMETHAMINE 30 MG/ML IJ SOLN
30.0000 mg | Freq: Once | INTRAMUSCULAR | Status: DC
  Filled 2015-12-26: qty 1

## 2015-12-26 MED ORDER — DIPHENHYDRAMINE HCL 50 MG/ML IJ SOLN
25.0000 mg | Freq: Once | INTRAMUSCULAR | Status: DC
  Filled 2015-12-26: qty 1

## 2015-12-26 MED ORDER — METOCLOPRAMIDE HCL 5 MG/ML IJ SOLN
10.0000 mg | Freq: Once | INTRAMUSCULAR | Status: DC
  Filled 2015-12-26: qty 2

## 2015-12-26 NOTE — ED Notes (Signed)
Per ems- pt comes from home c/o central cp x 3 days. Initially pain was "burning", now pain is "pressure" and radiating straight through her back. Has hx of gastric bypass surgery in 2013, and has had gastric issues in past. Reporting 5/10 pain. Given 1 nitro by ems without improvement. BP 124/92, HR 67 SR.

## 2015-12-26 NOTE — ED Provider Notes (Signed)
CSN: [id number]     Arrival date & time 12/26/15  [id number] History   First MD Initiated Contact with Patient 12/26/15 [phone]     Chief Complaint  Patient presents with  . Chest Pain   HPI Comments: 50 year old female who presents with chest pain for the past 4 days. PMH significant for sickle cell trait and iron deficiency anemia. Surgical hx significant for gastric bypass in 2013. She reports that she has been feeling lightheaded, fatigued, had pica, and a constant headache for over a week. She reports this is consistent with her symptoms when her blood counts are low. She has required iron infusions in the past with good results. She started to have chest pain 3-4 days ago. She tried antacids because it initially felt like a burning pain but had no relief. Now it feels like a pressure or squeezing. It is constant and radiates through to the back. She states she did have a recent cough several days ag which has resolved. She was given nitro by EMS with no relief and a worsening of her headache. Denies fever, chills, palpitations, leg swelling, wheezing, abdominal pain, N/V/D, irritative voiding symptoms. Denies heart or lung disease hx.      Past Medical History  Diagnosis Date  . Ulcer   . PONV (postoperative nausea and vomiting)    Past Surgical History  Procedure Laterality Date  . Cesarean section  1997, 1999    x2  . Tubal ligation  2002  . Cholecystectomy  1997  . Myomectomy  2010  . Knee surgery  2001    left  . Hysteroscopy  2006  . Gastric bypass  2013   Family History  Problem Relation Age of Onset  . Uterine cancer Mother   . Hypertension Mother   . Hyperlipidemia Mother   . Breast cancer Maternal Aunt   . Lung cancer Maternal Uncle   . Diabetes Maternal Grandfather   . Hyperlipidemia Maternal Grandfather   . Stroke Maternal Grandfather    Social History  Substance Use Topics  . Smoking status: Never Smoker   . Smokeless tobacco: Never Used  . Alcohol Use: No   Comment: occasionally   OB History    No data available     Review of Systems  Constitutional: Positive for fatigue. Negative for fever and chills.  Respiratory: Positive for cough and shortness of breath. Negative for wheezing.   Cardiovascular: Positive for chest pain. Negative for palpitations and leg swelling.  Gastrointestinal: Negative for nausea, vomiting, abdominal pain and diarrhea.  Genitourinary: Negative for dysuria.  Neurological: Positive for light-headedness and headaches. Negative for dizziness, syncope, speech difficulty, weakness and numbness.  All other systems reviewed and are negative.     Allergies  Aspirin; Ibuprofen; and Percocet  Home Medications   Prior to Admission medications   Medication Sig Start Date End Date Taking? Authorizing Provider  acetaminophen (TYLENOL) 500 MG tablet Take 1,000 mg by mouth every 6 (six) hours as needed for mild pain.    Historical Provider, MD  Calcium 600-400 MG-UNIT CHEW Chew 1 tablet by mouth 2 (two) times daily.    Historical Provider, MD  Cholecalciferol (VITAMIN D3) 1000 UNITS CAPS Take by mouth.    Historical Provider, MD  GARCINIA CAMBOGIA-CHROMIUM PO Take 1,000 mg by mouth 2 (two) times daily.    Historical Provider, MD  LUTEIN PO Take by mouth.    Historical Provider, MD  Multiple Vitamin (MULITIVITAMIN WITH MINERALS) TABS Take 1 tablet by  mouth 2 (two) times daily.     Historical Provider, MD  Omega-3 Fatty Acids (FISH OIL) 1000 MG CAPS Take 1 g by mouth.    Historical Provider, MD   BP 115/79 mmHg  Pulse 58  Temp(Src) 98.3 F (36.8 C) (Oral)  Resp 17  SpO2 100%  LMP 12/21/2015   Physical Exam  Constitutional: She is oriented to person, place, and time. She appears well-developed and well-nourished. No distress.  HENT:  Head: Normocephalic and atraumatic.  Eyes: Conjunctivae are normal. Pupils are equal, round, and reactive to light. Right eye exhibits no discharge. Left eye exhibits no discharge. No  scleral icterus.  Neck: Normal range of motion.  Cardiovascular: Normal rate and regular rhythm.  Exam reveals no gallop and no friction rub.   No murmur heard. Pulmonary/Chest: Effort normal and breath sounds normal. No respiratory distress. She has no wheezes. She has no rales. She exhibits no tenderness.  Abdominal: Soft. Bowel sounds are normal. She exhibits no distension and no mass. There is no tenderness. There is no rebound and no guarding.  Musculoskeletal:  No lower leg edema or calf tenderness  Neurological: She is alert and oriented to person, place, and time. No cranial nerve deficit.  Clear fluent speech, no focal deficits. Moves all extremities freely.  Skin: Skin is warm and dry. She is not diaphoretic.  Psychiatric: She has a normal mood and affect. Her behavior is normal.    ED Course  Procedures (including critical care time) Labs Review Labs Reviewed  BASIC METABOLIC PANEL - Abnormal; Notable for the following:    Glucose, Bld 101 (*)    All other components within normal limits  CBC - Abnormal; Notable for the following:    WBC 3.9 (*)    Hemoglobin 9.9 (*)    HCT 31.6 (*)    MCV 73.5 (*)    MCH 23.0 (*)    RDW 16.5 (*)    All other components within normal limits  I-STAT TROPOININ, ED    Imaging Review Dg Chest 2 View  12/26/2015  CLINICAL DATA:  Chest pain, shortness of breath, dizziness for 2 days EXAM: CHEST  2 VIEW COMPARISON:  CT chest 03/05/2012 FINDINGS: The heart size and mediastinal contours are within normal limits. Both lungs are clear. The visualized skeletal structures are unremarkable. IMPRESSION: No active cardiopulmonary disease. Electronically Signed   By: Elige Ko   On: 12/26/2015 10:15   I have personally reviewed and evaluated these images and lab results as part of my medical decision-making.   EKG Interpretation None      MDM   Final diagnoses:  Atypical chest pain  Anemia, unspecified anemia type   50 year old female who  presents with chest pain and anemia. CP is atypical in nature. Unclear etiology - she did have a recent cough, could be costochondritis. Chest pain work up is reassuring. EKG is sinus brady with short PR. CXR is negative. Troponin is 0.  No significant past or family hx of cardiac disease. Patient is non-smoker. HEART score is 1. PERC neg.   CBC remarkable for WBC of 3.9 - she has a history of neutropenia and is followed by hematology. Her H&H has dropped to 9.9/31.6 which is a significant drop since it was last checked in Aug. 2016 which confirms her suspicions that here anemia has worsened since she states she had similar symptoms last year and had to receive iron infusions which relieved her symptoms. Per Dr Earmon Phoenix note  he believed the most likely cause was due to her gastric bypass. Discussed follow up with Dr. Pamelia HoitGudena, patient verbalized understanding. We did try a migraine cocktail which did not provide relief.  Patient is NAD, non-toxic, with stable VS. Patient is informed of clinical course, understands medical decision making process, and agrees with plan. Opportunity for questions provided and all questions answered. Return precautions given.   Bethel BornKelly Marie Kinshasa Throckmorton, PA-C 12/27/15 1223  Laurence Spatesachel Morgan Little, MD 12/27/15 [phone]

## 2015-12-26 NOTE — ED Notes (Signed)
RN unable to establish IV x 2. PA made aware reports we can change to oral medication. Awaiting orders.

## 2015-12-26 NOTE — ED Notes (Signed)
Patient transported to X-ray 

## 2016-01-06 ENCOUNTER — Telehealth: Payer: Self-pay | Admitting: Hematology and Oncology

## 2016-01-06 ENCOUNTER — Encounter: Payer: Self-pay | Admitting: Hematology and Oncology

## 2016-01-06 ENCOUNTER — Other Ambulatory Visit (HOSPITAL_BASED_OUTPATIENT_CLINIC_OR_DEPARTMENT_OTHER): Payer: Commercial Managed Care - HMO

## 2016-01-06 ENCOUNTER — Ambulatory Visit (HOSPITAL_BASED_OUTPATIENT_CLINIC_OR_DEPARTMENT_OTHER): Payer: Commercial Managed Care - HMO | Admitting: Hematology and Oncology

## 2016-01-06 VITALS — BP 135/95 | HR 62 | Temp 98.0°F | Resp 18 | Ht 66.0 in | Wt 178.3 lb

## 2016-01-06 DIAGNOSIS — D509 Iron deficiency anemia, unspecified: Secondary | ICD-10-CM

## 2016-01-06 DIAGNOSIS — D709 Neutropenia, unspecified: Secondary | ICD-10-CM | POA: Diagnosis not present

## 2016-01-06 LAB — CBC WITH DIFFERENTIAL/PLATELET
BASO%: 1.1 % (ref 0.0–2.0)
BASOS ABS: 0 10*3/uL (ref 0.0–0.1)
EOS ABS: 0.1 10*3/uL (ref 0.0–0.5)
EOS%: 2.5 % (ref 0.0–7.0)
HEMATOCRIT: 33.7 % — AB (ref 34.8–46.6)
HEMOGLOBIN: 10.4 g/dL — AB (ref 11.6–15.9)
LYMPH#: 1.7 10*3/uL (ref 0.9–3.3)
LYMPH%: 47.4 % (ref 14.0–49.7)
MCH: 22 pg — ABNORMAL LOW (ref 25.1–34.0)
MCHC: 30.8 g/dL — ABNORMAL LOW (ref 31.5–36.0)
MCV: 71.3 fL — ABNORMAL LOW (ref 79.5–101.0)
MONO#: 0.3 10*3/uL (ref 0.1–0.9)
MONO%: 7.6 % (ref 0.0–14.0)
NEUT#: 1.5 10*3/uL (ref 1.5–6.5)
NEUT%: 41.4 % (ref 38.4–76.8)
PLATELETS: 176 10*3/uL (ref 145–400)
RBC: 4.72 10*6/uL (ref 3.70–5.45)
RDW: 17.5 % — AB (ref 11.2–14.5)
WBC: 3.7 10*3/uL — ABNORMAL LOW (ref 3.9–10.3)

## 2016-01-06 LAB — COMPREHENSIVE METABOLIC PANEL
ALT: 16 U/L (ref 0–55)
AST: 19 U/L (ref 5–34)
Albumin: 3.7 g/dL (ref 3.5–5.0)
Alkaline Phosphatase: 106 U/L (ref 40–150)
Anion Gap: 10 mEq/L (ref 3–11)
BILIRUBIN TOTAL: 0.97 mg/dL (ref 0.20–1.20)
BUN: 8.6 mg/dL (ref 7.0–26.0)
CO2: 23 meq/L (ref 22–29)
Calcium: 8.9 mg/dL (ref 8.4–10.4)
Chloride: 108 mEq/L (ref 98–109)
Creatinine: 0.8 mg/dL (ref 0.6–1.1)
GLUCOSE: 95 mg/dL (ref 70–140)
Potassium: 4.2 mEq/L (ref 3.5–5.1)
SODIUM: 140 meq/L (ref 136–145)
TOTAL PROTEIN: 7.1 g/dL (ref 6.4–8.3)

## 2016-01-06 LAB — IRON AND TIBC
%SAT: 7 % — AB (ref 21–57)
Iron: 28 ug/dL — ABNORMAL LOW (ref 41–142)
TIBC: 396 ug/dL (ref 236–444)
UIBC: 368 ug/dL (ref 120–384)

## 2016-01-06 LAB — FERRITIN: Ferritin: 4 ng/ml — ABNORMAL LOW (ref 9–269)

## 2016-01-06 NOTE — Telephone Encounter (Signed)
appt made and avs printed °

## 2016-01-06 NOTE — Assessment & Plan Note (Addendum)
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 Patient has prior history of iron deficiency anemia due to fibroids causing heavy menstrual bleeding which resolved after myomectomy in 2010. Gastric bypass surgery done 2013  Prior treatment: Patient had been on oral iron supplementation since 2010 but without adequate response; IV Iron 12/31/2014  Labs Reviewed: On 12/26/2015, hemoglobin dropped down to 9.9 with an MCV of 73.5 and RDW of 16.5. Our goal is to recheck iron studies today. If she is clearly iron deficient, I plan to administer IV iron therapy again.  Return to clinic in 6 months with labs and follow up.

## 2016-01-06 NOTE — Progress Notes (Signed)
Patient Care Team: Etta Grandchild, MD as PCP - General (Internal Medicine)  DIAGNOSIS:  1. 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 IV iron treatment 12/31/2014 2. 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 Recent blood work revealed anemia and is here today to discuss the role of iron replacement therapy.  REVIEW OF SYSTEMS:   Constitutional: Denies fevers, chills or abnormal weight loss Eyes: Denies blurriness of vision Ears, nose, mouth, throat, and face: Denies mucositis or sore throat Respiratory: Denies cough, dyspnea or wheezes Cardiovascular: Denies palpitation, chest discomfort Gastrointestinal:  Denies nausea, heartburn or change in bowel habits Skin: Denies abnormal skin rashes Lymphatics: Denies new lymphadenopathy or easy bruising Neurological:Denies numbness, tingling or new weaknesses Behavioral/Psych: Mood is stable, no new changes  Extremities: No lower extremity edema  All other systems were reviewed with the patient and are negative.  I have reviewed the past medical history, past surgical history, social history and family history with the patient and they are unchanged from previous note.  ALLERGIES:  is allergic to aspirin; ibuprofen; and percocet.  MEDICATIONS:  Current Outpatient Prescriptions  Medication Sig Dispense Refill  . acetaminophen (TYLENOL) 500 MG tablet Take 1,000 mg by mouth every 6 (six) hours as needed for mild pain.    . Calcium 600-400 MG-UNIT CHEW Chew 1 tablet by mouth 2 (two) times daily.    . Cholecalciferol (VITAMIN D3) 1000 UNITS CAPS Take 1 capsule by mouth daily.     . folic acid (FOLVITE) 1 MG tablet Take 1 mg by mouth daily.    Marland Kitchen GELATIN PO Take 1 capsule by mouth daily.    .  LUTEIN PO Take 1 capsule by mouth daily.     . Multiple Vitamin (MULITIVITAMIN WITH MINERALS) TABS Take 1 tablet by mouth 2 (two) times daily.     . Omega-3 Fatty Acids (FISH OIL) 1000 MG CAPS Take 1 g by mouth daily.      No current facility-administered medications for this visit.    PHYSICAL EXAMINATION: ECOG PERFORMANCE STATUS: 1 - Symptomatic but completely ambulatory  Filed Vitals:   01/06/16 0833  BP: 135/95  Pulse: 62  Temp: 98 F (36.7 C)  Resp: 18   Filed Weights   01/06/16 0833  Weight: 178 lb 4.8 oz (80.876 kg)    GENERAL:alert, no distress and comfortable SKIN: skin color, texture, turgor are normal, no rashes or significant lesions EYES: normal, Conjunctiva are pink and non-injected, sclera clear OROPHARYNX:no exudate, no erythema and lips, buccal mucosa, and tongue normal  NECK: supple, thyroid normal size, non-tender, without nodularity LYMPH:  no palpable lymphadenopathy in the cervical, axillary or inguinal LUNGS: clear to auscultation and percussion with normal breathing effort HEART: regular rate & rhythm and no murmurs and no lower extremity edema ABDOMEN:abdomen soft, non-tender and normal bowel sounds MUSCULOSKELETAL:no cyanosis of digits and no clubbing  NEURO: alert & oriented x 3 with fluent speech, no focal motor/sensory deficits EXTREMITIES: No lower extremity edema  LABORATORY DATA:  I have reviewed the data as listed   Chemistry      Component Value Date/Time   NA 140 01/06/2016 0808   NA 138 12/26/2015 0932   NA 141 06/24/2012 1416   Molina 4.2 01/06/2016 0808   Molina 4.2 12/26/2015 0932   Molina 3.7  06/24/2012 1416   CL 107 12/26/2015 0932   CL 110* 06/24/2012 1416   CO2 23 01/06/2016 0808   CO2 24 12/26/2015 0932   CO2 23 06/24/2012 1416   BUN 8.6 01/06/2016 0808   BUN 8 12/26/2015 0932   BUN 10 06/24/2012 1416   CREATININE 0.8 01/06/2016 0808   CREATININE 0.91 12/26/2015 0932   CREATININE 0.95 06/24/2012 1416      Component Value  Date/Time   CALCIUM 8.9 01/06/2016 0808   CALCIUM 9.3 12/26/2015 0932   CALCIUM 9.3 06/24/2012 1416   ALKPHOS 106 01/06/2016 0808   ALKPHOS 92 09/01/2014 1007   ALKPHOS 145* 06/24/2012 1416   AST 19 01/06/2016 0808   AST 18 09/01/2014 1007   AST 21 06/24/2012 1416   ALT 16 01/06/2016 0808   ALT 15 09/01/2014 1007   ALT 18 06/24/2012 1416   BILITOT 0.97 01/06/2016 0808   BILITOT 0.8 09/01/2014 1007   BILITOT 1.0 06/24/2012 1416       Lab Results  Component Value Date   WBC 3.7* 01/06/2016   HGB 10.4* 01/06/2016   HCT 33.7* 01/06/2016   MCV 71.3* 01/06/2016   PLT 176 01/06/2016   NEUTROABS 1.5 01/06/2016     ASSESSMENT & PLAN:  Anemia, iron deficiency Iron deficiency anemia most likely due to 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 Patient has prior history of iron deficiency anemia due to fibroids causing heavy menstrual bleeding which resolved after myomectomy in 2010. Gastric bypass surgery done 2013  Prior treatment: Patient had been on oral iron supplementation since 2010 but without adequate response (confirming malabsorption); IV Iron 12/31/2014 with excellent response  Labs Reviewed: On 12/26/2015, hemoglobin dropped down to 10.4 with an MCV of 71.3  Iron studies are pending from today. I anticipate that they will be low and I plan to administer IV iron therapy starting next week. Patient has significant difficulty with IV access. She will try to drink lots of water and hydrate herself prior to coming. We discussed different options including PICC line and a port placement but we felt it was unnecessary if she was only limited quite IV iron once a year.  Return to clinic in 1 year with labs and follow up.   No orders of the defined types were placed in this encounter.   The patient has a good understanding of the overall plan. she agrees with it. she will call with any problems that may develop before the next visit here.   Michelle Molina, Michelle Siguenza K,  MD 01/06/2016

## 2016-01-07 LAB — VITAMIN B12: VITAMIN B 12: 275 pg/mL (ref 211–946)

## 2016-01-09 LAB — FOLATE RBC
FOLATE, HEMOLYSATE: 330.8 ng/mL
HEMATOCRIT: 31.9 % — AB (ref 34.0–46.6)

## 2016-01-11 ENCOUNTER — Other Ambulatory Visit: Payer: Self-pay

## 2016-01-11 NOTE — Progress Notes (Signed)
Received [facility] fax that pt was requesting to reschedule appt.  Called and spoke with pt to discuss details.  Pt states she needs her Feraheme infusions on 7/21 and 7/28 to be moved back 30 minutes to 0830 as she does not get off of work until 0800.  I informed pt I would send message to our scheduler regarding pts request and they would be in contact with her.  Pt denies any further questions or concerns at time of call.  POF entered and sent to scheduler.

## 2016-01-13 ENCOUNTER — Ambulatory Visit (HOSPITAL_BASED_OUTPATIENT_CLINIC_OR_DEPARTMENT_OTHER): Payer: Commercial Managed Care - HMO

## 2016-01-13 VITALS — BP 113/64 | HR 70 | Temp 98.3°F | Resp 18

## 2016-01-13 DIAGNOSIS — D509 Iron deficiency anemia, unspecified: Secondary | ICD-10-CM | POA: Diagnosis not present

## 2016-01-13 MED ORDER — SODIUM CHLORIDE 0.9 % IV SOLN
510.0000 mg | Freq: Once | INTRAVENOUS | Status: AC
  Administered 2016-01-13: 510 mg via INTRAVENOUS
  Filled 2016-01-13: qty 17

## 2016-01-13 MED ORDER — SODIUM CHLORIDE 0.9 % IV SOLN
Freq: Once | INTRAVENOUS | Status: AC
  Administered 2016-01-13: 10:00:00 via INTRAVENOUS

## 2016-01-13 NOTE — Patient Instructions (Addendum)
Ferumoxytol injection What is this medicine? FERUMOXYTOL is an iron complex. Iron is used to make healthy red blood cells, which carry oxygen and nutrients throughout the body. This medicine is used to treat iron deficiency anemia in people with chronic kidney disease. This medicine may be used for other purposes; ask your health care provider or pharmacist if you have questions. What should I tell my health care provider before I take this medicine? They need to know if you have any of these conditions: -anemia not caused by low iron levels -high levels of iron in the blood -magnetic resonance imaging (MRI) test scheduled -an unusual or allergic reaction to iron, other medicines, foods, dyes, or preservatives -pregnant or trying to get pregnant -breast-feeding How should I use this medicine? This medicine is for injection into a vein. It is given by a health care professional in a hospital or clinic setting. Talk to your pediatrician regarding the use of this medicine in children. Special care may be needed. Overdosage: If you think you have taken too much of this medicine contact a poison control center or emergency room at once. NOTE: This medicine is only for you. Do not share this medicine with others. What if I miss a dose? It is important not to miss your dose. Call your doctor or health care professional if you are unable to keep an appointment. What may interact with this medicine? This medicine may interact with the following medications: -other iron products This list may not describe all possible interactions. Give your health care provider a list of all the medicines, herbs, non-prescription drugs, or dietary supplements you use. Also tell them if you smoke, drink alcohol, or use illegal drugs. Some items may interact with your medicine. What should I watch for while using this medicine? Visit your doctor or healthcare professional regularly. Tell your doctor or healthcare  professional if your symptoms do not start to get better or if they get worse. You may need blood work done while you are taking this medicine. You may need to follow a special diet. Talk to your doctor. Foods that contain iron include: whole grains/cereals, dried fruits, beans, or peas, leafy green vegetables, and organ meats (liver, kidney). What side effects may I notice from receiving this medicine? Side effects that you should report to your doctor or health care professional as soon as possible: -allergic reactions like skin rash, itching or hives, swelling of the face, lips, or tongue -breathing problems -changes in blood pressure -feeling faint or lightheaded, falls -fever or chills -flushing, sweating, or hot feelings -swelling of the ankles or feet Side effects that usually do not require medical attention (Report these to your doctor or health care professional if they continue or are bothersome.): -diarrhea -headache -nausea, vomiting -stomach pain This list may not describe all possible side effects. Call your doctor for medical advice about side effects. You may report side effects to FDA at 1-800-FDA-1088. Where should I keep my medicine? This drug is given in a hospital or clinic and will not be stored at home. NOTE: This sheet is a summary. It may not cover all possible information. If you have questions about this medicine, talk to your doctor, pharmacist, or health care provider.    2016, Elsevier/Gold Standard. (2012-01-25 15:23:36) Iron-Rich Diet Iron is a mineral that helps your body to produce hemoglobin. Hemoglobin is a protein in your red blood cells that carries oxygen to your body's tissues. Eating too little iron may cause you to  feel weak and tired, and it can increase your risk for infection. Eating enough iron is necessary for your body's metabolism, muscle function, and nervous system. Iron is naturally found in many foods. It can also be added to foods or  fortified in foods. There are two types of dietary iron:  Heme iron. Heme iron is absorbed by the body more easily than nonheme iron. Heme iron is found in meat, poultry, and fish.  Nonheme iron. Nonheme iron is found in dietary supplements, iron-fortified grains, beans, and vegetables. You may need to follow an iron-rich diet if:  You have been diagnosed with iron deficiency or iron-deficiency anemia.  You have a condition that prevents you from absorbing dietary iron, such as:  Infection in your intestines.  Celiac disease. This involves long-lasting (chronic) inflammation of your intestines.  You do not eat enough iron.  You eat a diet that is high in foods that impair iron absorption.  You have lost a lot of blood.  You have heavy bleeding during your menstrual cycle.  You are pregnant. WHAT IS MY PLAN? Your health care provider may help you to determine how much iron you need per day based on your condition. Generally, when a person consumes sufficient amounts of iron in the diet, the following iron needs are met:  Men.  43-59 years old: 11 mg per day.  64-53 years old: 8 mg per day.  Women.   80-36 years old: 15 mg per day.  65-31 years old: 18 mg per day.  Over 61 years old: 8 mg per day.  Pregnant women: 27 mg per day.  Breastfeeding women: 9 mg per day. WHAT DO I NEED TO KNOW ABOUT AN IRON-RICH DIET?  Eat fresh fruits and vegetables that are high in vitamin C along with foods that are high in iron. This will help increase the amount of iron that your body absorbs from food, especially with foods containing nonheme iron. Foods that are high in vitamin C include oranges, peppers, tomatoes, and mango.  Take iron supplements only as directed by your health care provider. Overdose of iron can be life-threatening. If you were prescribed iron supplements, take them with orange juice or a vitamin C supplement.  Cook foods in pots and pans that are made from iron.    Eat nonheme iron-containing foods alongside foods that are high in heme iron. This helps to improve your iron absorption.   Certain foods and drinks contain compounds that impair iron absorption. Avoid eating these foods in the same meal as iron-rich foods or with iron supplements. These include:  Coffee, black tea, and red wine.  Milk, dairy products, and foods that are high in calcium.  Beans, soybeans, and peas.  Whole grains.  When eating foods that contain both nonheme iron and compounds that impair iron absorption, follow these tips to absorb iron better.   Soak beans overnight before cooking.  Soak whole grains overnight and drain them before using.  Ferment flours before baking, such as using yeast in bread dough. WHAT FOODS CAN I EAT? Grains Iron-fortified breakfast cereal. Iron-fortified whole-wheat bread. Enriched rice. Sprouted grains. Vegetables Spinach. Potatoes with skin. Green peas. Broccoli. Red and green bell peppers. Fermented vegetables. Fruits Prunes. Raisins. Oranges. Strawberries. Mango. Grapefruit. Meats and Other Protein Sources Beef liver. Oysters. Beef. Shrimp. [place]. Chicken. Ray. Sardines. Chickpeas. Nuts. Tofu. Beverages Tomato juice. Fresh orange juice. Prune juice. Hibiscus tea. Fortified instant breakfast shakes. Condiments Tahini. Fermented soy sauce. Sweets and Desserts Black-strap  molasses.  Other Wheat germ. The items listed above may not be a complete list of recommended foods or beverages. Contact your dietitian for more options. WHAT FOODS ARE NOT RECOMMENDED? Grains Whole grains. Bran cereal. Bran flour. Oats. Vegetables Artichokes. Brussels sprouts. Kale. Fruits Blueberries. Raspberries. Strawberries. Figs. Meats and Other Protein Sources Soybeans. Products made from soy protein. Dairy Milk. Cream. Cheese. Yogurt. Cottage cheese. Beverages Coffee. Black tea. Red wine. Sweets and Desserts Cocoa.  Chocolate. Ice cream. Other Basil. Oregano. Parsley. The items listed above may not be a complete list of foods and beverages to avoid. Contact your dietitian for more information.   This information is not intended to replace advice given to you by your health care provider. Make sure you discuss any questions you have with your health care provider.   Document Released: 01/23/2005 Document Revised: 07/02/2014 Document Reviewed: 01/06/2014 Elsevier Interactive Patient Education [employer].

## 2016-01-20 ENCOUNTER — Ambulatory Visit (HOSPITAL_BASED_OUTPATIENT_CLINIC_OR_DEPARTMENT_OTHER): Payer: Commercial Managed Care - HMO

## 2016-01-20 VITALS — BP 113/68 | HR 67 | Temp 98.6°F | Resp 17

## 2016-01-20 DIAGNOSIS — D509 Iron deficiency anemia, unspecified: Secondary | ICD-10-CM | POA: Diagnosis not present

## 2016-01-20 MED ORDER — SODIUM CHLORIDE 0.9 % IV SOLN
Freq: Once | INTRAVENOUS | Status: AC
  Administered 2016-01-20: 10:00:00 via INTRAVENOUS

## 2016-01-20 MED ORDER — SODIUM CHLORIDE 0.9 % IV SOLN
510.0000 mg | Freq: Once | INTRAVENOUS | Status: AC
  Administered 2016-01-20: 510 mg via INTRAVENOUS
  Filled 2016-01-20: qty 17

## 2016-05-14 ENCOUNTER — Ambulatory Visit (INDEPENDENT_AMBULATORY_CARE_PROVIDER_SITE_OTHER): Payer: Self-pay | Admitting: Orthopaedic Surgery

## 2016-07-14 DIAGNOSIS — S335XXA Sprain of ligaments of lumbar spine, initial encounter: Secondary | ICD-10-CM | POA: Diagnosis not present

## 2016-07-16 DIAGNOSIS — M7661 Achilles tendinitis, right leg: Secondary | ICD-10-CM | POA: Diagnosis not present

## 2016-07-16 DIAGNOSIS — M25562 Pain in left knee: Secondary | ICD-10-CM | POA: Diagnosis not present

## 2016-07-16 DIAGNOSIS — M1712 Unilateral primary osteoarthritis, left knee: Secondary | ICD-10-CM | POA: Diagnosis not present

## 2016-08-01 DIAGNOSIS — M7661 Achilles tendinitis, right leg: Secondary | ICD-10-CM | POA: Diagnosis not present

## 2016-08-06 DIAGNOSIS — M7661 Achilles tendinitis, right leg: Secondary | ICD-10-CM | POA: Diagnosis not present

## 2016-08-08 DIAGNOSIS — M7661 Achilles tendinitis, right leg: Secondary | ICD-10-CM | POA: Diagnosis not present

## 2016-08-13 DIAGNOSIS — M7661 Achilles tendinitis, right leg: Secondary | ICD-10-CM | POA: Diagnosis not present

## 2016-08-15 DIAGNOSIS — M7661 Achilles tendinitis, right leg: Secondary | ICD-10-CM | POA: Diagnosis not present

## 2016-08-22 ENCOUNTER — Other Ambulatory Visit: Payer: Self-pay

## 2016-08-22 ENCOUNTER — Telehealth: Payer: Self-pay

## 2016-08-22 ENCOUNTER — Telehealth: Payer: Self-pay | Admitting: *Deleted

## 2016-08-22 DIAGNOSIS — D509 Iron deficiency anemia, unspecified: Secondary | ICD-10-CM

## 2016-08-22 DIAGNOSIS — M7661 Achilles tendinitis, right leg: Secondary | ICD-10-CM | POA: Diagnosis not present

## 2016-08-22 NOTE — Telephone Encounter (Signed)
Called patient back and confirmed time/date of appt tomorrow to have labs drawn in the morning and to see Dr.Gudena tomorrow afternoon. Pt verbalized understanding.

## 2016-08-22 NOTE — Telephone Encounter (Signed)
Received msg from triage regarding pt symptoms and needed an appt with Dr.Gudena as soon as possible. Called pt and states that she cannot wait until her next appt in July to have her iron levels checked. Pt states that she has been having the same symptoms and knows that she is low on iron and may need transfused. Pt wants to see Dr.Gudena this week. Told pt that she can be seen tomorrow afternoon, but she will need to have iron levels drawn today or tomorrow morning. Confirmed time/date with patient.

## 2016-08-22 NOTE — Telephone Encounter (Signed)
Call transferred from scheduler for this patient wanting an appointment to see Dr. Pamelia HoitGudena.  "I have uncontrolled headaches, fatigue, general body aches and shaking for the past two weeks.  I can tell my iron levels are dropping and can't wait until July for F/U.  Return number [phone]."

## 2016-08-23 ENCOUNTER — Encounter: Payer: Self-pay | Admitting: Hematology and Oncology

## 2016-08-23 ENCOUNTER — Ambulatory Visit (HOSPITAL_BASED_OUTPATIENT_CLINIC_OR_DEPARTMENT_OTHER): Payer: Commercial Managed Care - HMO | Admitting: Hematology and Oncology

## 2016-08-23 ENCOUNTER — Other Ambulatory Visit (HOSPITAL_BASED_OUTPATIENT_CLINIC_OR_DEPARTMENT_OTHER): Payer: Commercial Managed Care - HMO

## 2016-08-23 ENCOUNTER — Other Ambulatory Visit: Payer: Self-pay

## 2016-08-23 DIAGNOSIS — D509 Iron deficiency anemia, unspecified: Secondary | ICD-10-CM | POA: Diagnosis not present

## 2016-08-23 DIAGNOSIS — K909 Intestinal malabsorption, unspecified: Secondary | ICD-10-CM

## 2016-08-23 LAB — CBC WITH DIFFERENTIAL/PLATELET
BASO%: 1 % (ref 0.0–2.0)
BASOS ABS: 0 10*3/uL (ref 0.0–0.1)
EOS%: 2 % (ref 0.0–7.0)
Eosinophils Absolute: 0.1 10*3/uL (ref 0.0–0.5)
HCT: 38.1 % (ref 34.8–46.6)
HEMOGLOBIN: 12.4 g/dL (ref 11.6–15.9)
LYMPH%: 51.4 % — AB (ref 14.0–49.7)
MCH: 26.3 pg (ref 25.1–34.0)
MCHC: 32.5 g/dL (ref 31.5–36.0)
MCV: 81 fL (ref 79.5–101.0)
MONO#: 0.2 10*3/uL (ref 0.1–0.9)
MONO%: 6.7 % (ref 0.0–14.0)
NEUT#: 1.4 10*3/uL — ABNORMAL LOW (ref 1.5–6.5)
NEUT%: 38.9 % (ref 38.4–76.8)
Platelets: 187 10*3/uL (ref 145–400)
RBC: 4.71 10*6/uL (ref 3.70–5.45)
RDW: 16 % — ABNORMAL HIGH (ref 11.2–14.5)
WBC: 3.5 10*3/uL — ABNORMAL LOW (ref 3.9–10.3)
lymph#: 1.8 10*3/uL (ref 0.9–3.3)

## 2016-08-23 LAB — IRON AND TIBC
%SAT: 25 % (ref 21–57)
IRON: 82 ug/dL (ref 41–142)
TIBC: 325 ug/dL (ref 236–444)
UIBC: 243 ug/dL (ref 120–384)

## 2016-08-23 LAB — FERRITIN: FERRITIN: 5 ng/mL — AB (ref 9–269)

## 2016-08-23 NOTE — Assessment & Plan Note (Signed)
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 Patient has prior history of iron deficiency anemia due to fibroids causing heavy menstrual bleeding which resolved after myomectomy in 2010. Gastric bypass surgery done 2013  Prior treatment: Patient was on oral iron supplementation since 2010 but without adequate response (confirming malabsorption); IV Iron 12/31/2014 with excellent response IV iron 01/13/2016  Labs Reviewed:  Return to clinic in 6 months with labs and follow-up.

## 2016-08-23 NOTE — Progress Notes (Signed)
Patient Care Team: Etta Grandchildhomas L Jones, MD as PCP - General (Internal Medicine)  DIAGNOSIS:  Encounter Diagnosis  Name Primary?  . 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 She is here for blood count check and follow-up. She reports that she is having frequent headaches which were assigned to find deficiency anemia in the previous times. The headaches have not gotten as intense as she used to have it previously.  REVIEW OF SYSTEMS:   Constitutional: Denies fevers, chills or abnormal weight loss Eyes: Denies blurriness of vision Ears, nose, mouth, throat, and face: Denies mucositis or sore throat Respiratory: Denies cough, dyspnea or wheezes Cardiovascular: Denies palpitation, chest discomfort Gastrointestinal:  Denies nausea, heartburn or change in bowel habits Skin: Denies abnormal skin rashes Lymphatics: Denies new lymphadenopathy or easy bruising Neurological:Denies numbness, tingling or new weaknesses Behavioral/Psych: Mood is stable, no new changes  Extremities: No lower extremity edema All other systems were reviewed with the patient and are negative.  I have reviewed the past medical history, past surgical history, social history and family history with the patient and they are unchanged from previous note.  ALLERGIES:  is allergic to aspirin; ibuprofen; and percocet [oxycodone-acetaminophen].  MEDICATIONS:  Current Outpatient Prescriptions  Medication Sig Dispense Refill  . acetaminophen (TYLENOL) 500 MG tablet Take 1,000 mg by mouth every 6 (six) hours as needed for mild pain.    . Calcium 600-400 MG-UNIT CHEW Chew 1 tablet by mouth 2 (two) times daily.    . Cholecalciferol (VITAMIN D3) 1000 UNITS CAPS Take 1 capsule by  mouth daily.     . folic acid (FOLVITE) 1 MG tablet Take 1 mg by mouth daily.    Marland Kitchen. GELATIN PO Take 1 capsule by mouth daily.    . LUTEIN PO Take 1 capsule by mouth daily.     . Multiple Vitamin (MULITIVITAMIN WITH MINERALS) TABS Take 1 tablet by mouth 2 (two) times daily.     . Omega-3 Fatty Acids (FISH OIL) 1000 MG CAPS Take 1 g by mouth daily.      No current facility-administered medications for this visit.     PHYSICAL EXAMINATION: ECOG PERFORMANCE STATUS: 1 - Symptomatic but completely ambulatory  Vitals:   08/23/16 1504  BP: 132/86  Pulse: 66  Resp: 18  Temp: 97.5 F (36.4 C)   Filed Weights   08/23/16 1504  Weight: 174 lb (78.9 kg)    GENERAL:alert, no distress and comfortable SKIN: skin color, texture, turgor are normal, no rashes or significant lesions EYES: normal, Conjunctiva are pink and non-injected, sclera clear OROPHARYNX:no exudate, no erythema and lips, buccal mucosa, and tongue normal  NECK: supple, thyroid normal size, non-tender, without nodularity LYMPH:  no palpable lymphadenopathy in the cervical, axillary or inguinal LUNGS: clear to auscultation and percussion with normal breathing effort HEART: regular rate & rhythm and no murmurs and no lower extremity edema ABDOMEN:abdomen soft, non-tender and normal bowel sounds MUSCULOSKELETAL:no cyanosis of digits and no clubbing  NEURO: alert & oriented x 3 with fluent speech, no focal motor/sensory deficits EXTREMITIES: No lower extremity edema  LABORATORY DATA:  I have reviewed the data as listed   Chemistry      Component Value Date/Time   NA 140 01/06/2016 0808   K 4.2 01/06/2016 16100808  CL 107 12/26/2015 0932   CL 110 (H) 06/24/2012 1416   CO2 23 01/06/2016 0808   BUN 8.6 01/06/2016 0808   CREATININE 0.8 01/06/2016 0808      Component Value Date/Time   CALCIUM 8.9 01/06/2016 0808   ALKPHOS 106 01/06/2016 0808   AST 19 01/06/2016 0808   ALT 16 01/06/2016 0808   BILITOT 0.97 01/06/2016 0808         Lab Results  Component Value Date   WBC 3.5 (L) 08/23/2016   HGB 12.4 08/23/2016   HCT 38.1 08/23/2016   MCV 81.0 08/23/2016   PLT 187 08/23/2016   NEUTROABS 1.4 (L) 08/23/2016    ASSESSMENT & PLAN:  Anemia, iron deficiency 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 Patient has prior history of iron deficiency anemia due to fibroids causing heavy menstrual bleeding which resolved after myomectomy in 2010. Gastric bypass surgery done 2013  Prior treatment: Patient was on oral iron supplementation since 2010 but without adequate response (confirming malabsorption); IV Iron 12/31/2014 with excellent response IV iron 01/13/2016  Labs Reviewed: Ferritin is 5, hemoglobin is 12.4, iron saturation is 28%. Based on her symptoms, patient is likely to have severe anemia. 7. So preemptively I recommended that we give her intravenous iron treatment. She will come next week to receive the IV iron infusions. She has a very difficult IV stick. I instructed her to dramatically increase her water intake prior to coming.  Return to clinic in 6 months with labs and follow-up. I suspect that she may be requiring IV iron every 6-9 months.  I spent 25 minutes talking to the patient of which more than half was spent in counseling and coordination of care.  No orders of the defined types were placed in this encounter.  The patient has a good understanding of the overall plan. she agrees with it. she will call with any problems that may develop before the next visit here.   Sabas Sous, MD 08/23/16

## 2016-08-27 DIAGNOSIS — M7661 Achilles tendinitis, right leg: Secondary | ICD-10-CM | POA: Diagnosis not present

## 2016-08-29 DIAGNOSIS — M7661 Achilles tendinitis, right leg: Secondary | ICD-10-CM | POA: Diagnosis not present

## 2016-08-31 ENCOUNTER — Ambulatory Visit (HOSPITAL_BASED_OUTPATIENT_CLINIC_OR_DEPARTMENT_OTHER): Payer: Commercial Managed Care - HMO

## 2016-08-31 VITALS — BP 128/84 | HR 59 | Temp 98.2°F | Resp 17

## 2016-08-31 DIAGNOSIS — D509 Iron deficiency anemia, unspecified: Secondary | ICD-10-CM | POA: Diagnosis not present

## 2016-08-31 MED ORDER — SODIUM CHLORIDE 0.9 % IV SOLN
Freq: Once | INTRAVENOUS | Status: AC
  Administered 2016-08-31: 10:00:00 via INTRAVENOUS

## 2016-08-31 MED ORDER — SODIUM CHLORIDE 0.9 % IV SOLN
510.0000 mg | Freq: Once | INTRAVENOUS | Status: AC
  Administered 2016-08-31: 510 mg via INTRAVENOUS
  Filled 2016-08-31: qty 17

## 2016-08-31 MED ORDER — ONDANSETRON HCL 40 MG/20ML IJ SOLN
8.0000 mg | Freq: Once | INTRAMUSCULAR | Status: DC
Start: 2016-08-31 — End: 2016-08-31

## 2016-08-31 MED ORDER — ACETAMINOPHEN 325 MG PO TABS
ORAL_TABLET | ORAL | Status: AC
  Filled 2016-08-31: qty 2

## 2016-08-31 MED ORDER — ACETAMINOPHEN 325 MG PO TABS
650.0000 mg | ORAL_TABLET | Freq: Once | ORAL | Status: AC
  Administered 2016-08-31: 650 mg via ORAL

## 2016-08-31 MED ORDER — ONDANSETRON HCL 4 MG/2ML IJ SOLN
INTRAMUSCULAR | Status: AC
  Filled 2016-08-31: qty 4

## 2016-08-31 MED ORDER — ONDANSETRON HCL 4 MG/2ML IJ SOLN
8.0000 mg | Freq: Once | INTRAMUSCULAR | Status: AC
  Administered 2016-08-31: 8 mg via INTRAVENOUS

## 2016-08-31 NOTE — Progress Notes (Signed)
Pt complains of headache and nausea. Pt states that she can take tylenol. Notified Dr.Gudena and obtained order for iv zofran and tylenol 650mg . Per infusion RN, pt typically gets a headache, but has not had any nausea until now. This is not first time feraheme infusion.  Will reassess pt symptoms prior to discharge.

## 2016-08-31 NOTE — Progress Notes (Signed)
At time of discharge, patient states HA and nausea have resolved.

## 2016-08-31 NOTE — Patient Instructions (Signed)

## 2016-08-31 NOTE — Progress Notes (Deleted)
After infusion completed, patient c/o HA and nausea. Dr. Pamelia HoitGudena notified by May Armel, RN. Orders received. At time of discharge, patient verbalized complete relief of HA and nausea. No additional complaints.

## 2016-09-07 ENCOUNTER — Ambulatory Visit (HOSPITAL_BASED_OUTPATIENT_CLINIC_OR_DEPARTMENT_OTHER): Payer: Commercial Managed Care - HMO

## 2016-09-07 ENCOUNTER — Ambulatory Visit (HOSPITAL_BASED_OUTPATIENT_CLINIC_OR_DEPARTMENT_OTHER): Payer: Commercial Managed Care - HMO | Admitting: Nurse Practitioner

## 2016-09-07 ENCOUNTER — Other Ambulatory Visit: Payer: Self-pay | Admitting: *Deleted

## 2016-09-07 ENCOUNTER — Encounter: Payer: Self-pay | Admitting: Nurse Practitioner

## 2016-09-07 VITALS — BP 119/79 | HR 72 | Temp 98.3°F | Resp 18

## 2016-09-07 DIAGNOSIS — D509 Iron deficiency anemia, unspecified: Secondary | ICD-10-CM

## 2016-09-07 DIAGNOSIS — T80818A Extravasation of other vesicant agent, initial encounter: Secondary | ICD-10-CM

## 2016-09-07 MED ORDER — PROCHLORPERAZINE MALEATE 10 MG PO TABS: 10.0000 mg | ORAL_TABLET | Freq: Four times a day (QID) | ORAL | 0 refills | Status: DC | PRN

## 2016-09-07 MED ORDER — ONDANSETRON HCL 8 MG PO TABS
8.0000 mg | ORAL_TABLET | Freq: Once | ORAL | Status: AC
  Administered 2016-09-07: 8 mg via ORAL

## 2016-09-07 MED ORDER — ONDANSETRON HCL 8 MG PO TABS
ORAL_TABLET | ORAL | Status: AC
  Filled 2016-09-07: qty 1

## 2016-09-07 MED ORDER — SODIUM CHLORIDE 0.9 % IV SOLN
Freq: Once | INTRAVENOUS | Status: AC
  Administered 2016-09-07: 10:00:00 via INTRAVENOUS

## 2016-09-07 MED ORDER — SODIUM CHLORIDE 0.9 % IV SOLN
510.0000 mg | Freq: Once | INTRAVENOUS | Status: AC
  Administered 2016-09-07: 510 mg via INTRAVENOUS
  Filled 2016-09-07: qty 17

## 2016-09-07 NOTE — Progress Notes (Signed)
1115 pt c/o numbness to right arm, arm and hand cold to touch with slight swelling in upper FA with mild red streaking noted, NS stopped, blood return flushes w/o difficulty and has blood return. PIV removed and warm towel place on arm and hand, Everrett Coombeyndee Bacon NP and Dr. Pamelia HoitGudena aware.  1123: Pt reports severe nausea with a sudden onset, Dr. Pamelia HoitGudena aware and PO Zofran 8 mg ordered. 1126: Everrett Coombeyndee Bacon NP at chairside, redness has decreased.   Pt educated on hypersensitive protocol if needed and to monitor right arm per Everrett Coombeyndee Bacon NP. Pt verbalizes understanding.  1136: okay to discharge pt per Everrett Coombeyndee Bacon NP pt and VS stable at discharge. Pt educated to contact clinic with any concerns. Pt verbalizes understanding.

## 2016-09-07 NOTE — Assessment & Plan Note (Signed)
Patient has been diagnosed withdeficiency anemia, most likely secondary to malabsorption.  Patient presented to the cancer Center today to receive her next iron infusion.  See further notes for details of today's visit.  Patient is scheduled to return in approximate 6 months for labs and her next follow-up visit.  These appointment has not been scheduled yet.  Patient is to call in the interim for any new worries or concerns whatsoever.

## 2016-09-07 NOTE — Patient Instructions (Signed)

## 2016-09-07 NOTE — Assessment & Plan Note (Signed)
Patient presented to the cancer Center today to receive her next iron infusion.  The iron infusion had completely finished; and patient was receiving normal saline IV fluids only as a flush.  She experienced some initial coolness, erythema, andtingling sensation to the right antecubital region.  While the saline was infusing. On further exam  It does appear the patient may have some questionable trace extravasation of the saline fluid into the right upper forearm.  Directly below the antecubital area.  The right forearm is slightly cool; the patient denies any numbness, tingling, or weakness.  Patient was advised to call/return of her directly to the emergency department for any worsening symptoms whatsoever.
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
## 2016-09-10 ENCOUNTER — Telehealth: Payer: Self-pay | Admitting: Nurse Practitioner

## 2016-09-10 NOTE — Telephone Encounter (Signed)
Called to check in on patient regarding a questionable extravasation of saline only to the right antecubital region of her right arm.  There was no answer and this provider left a voicemail.

## 2016-09-11 ENCOUNTER — Encounter: Payer: Self-pay | Admitting: Internal Medicine

## 2016-09-11 ENCOUNTER — Ambulatory Visit (INDEPENDENT_AMBULATORY_CARE_PROVIDER_SITE_OTHER): Payer: Commercial Managed Care - HMO | Admitting: Internal Medicine

## 2016-09-11 ENCOUNTER — Ambulatory Visit (INDEPENDENT_AMBULATORY_CARE_PROVIDER_SITE_OTHER)
Admission: RE | Admit: 2016-09-11 | Discharge: 2016-09-11 | Disposition: A | Discharge status: Home / Self Care | Payer: Commercial Managed Care - HMO | Source: Ambulatory Visit | Attending: Internal Medicine | Admitting: Internal Medicine

## 2016-09-11 VITALS — BP 120/76 | HR 76 | Temp 98.4°F | Resp 16 | Ht 66.0 in | Wt 176.1 lb

## 2016-09-11 DIAGNOSIS — K529 Noninfective gastroenteritis and colitis, unspecified: Secondary | ICD-10-CM | POA: Diagnosis not present

## 2016-09-11 DIAGNOSIS — R1084 Generalized abdominal pain: Secondary | ICD-10-CM

## 2016-09-11 MED ORDER — PROCHLORPERAZINE MALEATE 10 MG PO TABS: 10.0000 mg | ORAL_TABLET | Freq: Four times a day (QID) | ORAL | 0 refills | Status: DC | PRN

## 2016-09-11 NOTE — Progress Notes (Signed)
Subjective:  Patient ID: Germain Osgoodopaz D Wise, female    DOB: [birth year]  Age: 51 y.o. MRN: [id number]  CC: Abdominal Pain   HPI Germain Osgoodopaz D Hocker presents for The acute onset today of muscle aches, lightheadedness, nausea, vomiting, watery diarrhea, chills, and crampy abdominal pain. She denies fever, rash, bloody stool, lymphadenopathy, dysuria, or hematuria.  Outpatient Medications Prior to Visit  Medication Sig Dispense Refill  . acetaminophen (TYLENOL) 500 MG tablet Take 1,000 mg by mouth every 6 (six) hours as needed for mild pain.    . Calcium 600-400 MG-UNIT CHEW Chew 1 tablet by mouth 2 (two) times daily.    . Cholecalciferol (VITAMIN D3) 1000 UNITS CAPS Take 1 capsule by mouth daily.     . Cyanocobalamin (VITAMIN B 12 PO) Take 1 tablet by mouth daily.    . Multiple Vitamin (MULITIVITAMIN WITH MINERALS) TABS Take 1 tablet by mouth 2 (two) times daily.     . Omega-3 Fatty Acids (FISH OIL) 1000 MG CAPS Take 1 g by mouth daily.     . Probiotic Product (PROBIOTIC DAILY PO) Take 1 tablet by mouth.    . folic acid (FOLVITE) 1 MG tablet Take 1 mg by mouth daily.    Marland Kitchen. GELATIN PO Take 1 capsule by mouth daily.    . LUTEIN PO Take 1 capsule by mouth daily.     . prochlorperazine (COMPAZINE) 10 MG tablet Take 1 tablet (10 mg total) by mouth every 6 (six) hours as needed for nausea or vomiting. (Patient not taking: Reported on 09/11/2016) 30 tablet 0   No facility-administered medications prior to visit.     ROS Review of Systems  Constitutional: Positive for chills and fatigue. Negative for activity change, appetite change, diaphoresis, fever and unexpected weight change.  HENT: Negative.  Negative for sore throat and trouble swallowing.   Eyes: Negative.  Negative for visual disturbance.  Respiratory: Negative for cough, chest tightness, shortness of breath, wheezing and stridor.   Cardiovascular: Negative for chest pain, palpitations and leg swelling.  Gastrointestinal: Positive for abdominal  pain, diarrhea, nausea and vomiting. Negative for abdominal distention, blood in stool and rectal pain.  Genitourinary: Negative.  Negative for decreased urine volume, difficulty urinating, dysuria, frequency and urgency.  Musculoskeletal: Positive for myalgias. Negative for arthralgias, back pain and neck pain.  Skin: Negative.  Negative for color change, pallor and rash.  Allergic/Immunologic: Negative.   Neurological: Positive for light-headedness. Negative for dizziness and weakness.  Hematological: Negative for adenopathy. Does not bruise/bleed easily.  Psychiatric/Behavioral: Negative.     Objective:  BP 120/76 (BP Location: Left Arm, Patient Position: Sitting, Cuff Size: Normal)   Pulse 76   Temp 98.4 F (36.9 C) (Oral)   Resp 16   Ht 5\' 6"  (1.676 m)   Wt 176 lb 1.3 oz (79.9 kg)   SpO2 98% Comment: unable  BMI 28.42 kg/m   BP Readings from Last 3 Encounters:  09/11/16 120/76  09/07/16 119/79  08/31/16 128/84    Wt Readings from Last 3 Encounters:  09/11/16 176 lb 1.3 oz (79.9 kg)  08/23/16 174 lb (78.9 kg)  01/06/16 178 lb 4.8 oz (80.9 kg)    Physical Exam  Constitutional: She is oriented to person, place, and time. She appears well-developed and well-nourished.  Non-toxic appearance. She does not have a sickly appearance. She does not appear ill. No distress.  HENT:  Mouth/Throat: Oropharynx is clear and moist. No oropharyngeal exudate.  Eyes: Conjunctivae are normal. Right eye exhibits  no discharge. Left eye exhibits no discharge. No scleral icterus.  Neck: Normal range of motion. Neck supple. No JVD present. No tracheal deviation present. No thyromegaly present.  Cardiovascular: Normal rate, regular rhythm, normal heart sounds and intact distal pulses.  Exam reveals no gallop and no friction rub.   No murmur heard. Pulmonary/Chest: Effort normal and breath sounds normal. No stridor. No respiratory distress. She has no wheezes. She has no rales. She exhibits no  tenderness.  Abdominal: Soft. Normal appearance. She exhibits no distension and no mass. Bowel sounds are decreased. There is no hepatosplenomegaly. There is no tenderness. There is no rebound, no guarding and no CVA tenderness.  Musculoskeletal: She exhibits no edema or tenderness.  Lymphadenopathy:    She has no cervical adenopathy.  Neurological: She is oriented to person, place, and time.  Skin: Skin is warm. No rash noted. She is not diaphoretic. No erythema.  Vitals reviewed.   Lab Results  Component Value Date   WBC 3.5 (L) 08/23/2016   HGB 12.4 08/23/2016   HCT 38.1 08/23/2016   PLT 187 08/23/2016   GLUCOSE 95 01/06/2016   CHOL 131 12/08/2012   TRIG 50.0 12/08/2012   HDL 48.70 12/08/2012   LDLCALC 72 12/08/2012   ALT 16 01/06/2016   AST 19 01/06/2016   NA 140 01/06/2016   K 4.2 01/06/2016   CL 107 12/26/2015   CREATININE 0.8 01/06/2016   BUN 8.6 01/06/2016   CO2 23 01/06/2016   TSH 1.25 12/17/2014   HGBA1C 5.3 12/08/2012    Dg Chest 2 View  Result Date: 12/26/2015 CLINICAL DATA:  Chest pain, shortness of breath, dizziness for 2 days EXAM: CHEST  2 VIEW COMPARISON:  CT chest 03/05/2012 FINDINGS: The heart size and mediastinal contours are within normal limits. Both lungs are clear. The visualized skeletal structures are unremarkable. IMPRESSION: No active cardiopulmonary disease. Electronically Signed   By: Elige Ko   On: 12/26/2015 10:15   No results found.  Assessment & Plan:   Vivyan was seen today for abdominal pain.  Diagnoses and all orders for this visit:  Generalized abdominal cramps- her plain films are negative for ileus or small bowel obstruction -     DG Abd Acute W/Chest; Future  Gastroenteritis, acute- her vital signs are normal, her abdominal exam is negative for any acute findings, abdominal plain films are unremarkable. Will treat for acute viral gastroenteritis with prochlorperazine as needed. -     prochlorperazine (COMPAZINE) 10 MG tablet;  Take 1 tablet (10 mg total) by mouth every 6 (six) hours as needed for nausea or vomiting.   I am having Ms. Goding maintain her multivitamin with minerals, Calcium, acetaminophen, Vitamin D3, Fish Oil, LUTEIN PO, GELATIN PO, folic acid, Probiotic Product (PROBIOTIC DAILY PO), Cyanocobalamin (VITAMIN B 12 PO), and prochlorperazine.  Meds ordered this encounter  Medications  . prochlorperazine (COMPAZINE) 10 MG tablet    Sig: Take 1 tablet (10 mg total) by mouth every 6 (six) hours as needed for nausea or vomiting.    Dispense:  30 tablet    Refill:  0     Follow-up: Return in about 1 day (around 09/12/2016).  Sanda Linger, MD

## 2016-09-11 NOTE — Progress Notes (Signed)
Pre visit review using our clinic review tool, if applicable. No additional management support is needed unless otherwise documented below in the visit note. 

## 2016-09-11 NOTE — Patient Instructions (Signed)

## 2016-11-30 ENCOUNTER — Encounter: Payer: Self-pay | Admitting: Physician Assistant

## 2016-11-30 ENCOUNTER — Ambulatory Visit (INDEPENDENT_AMBULATORY_CARE_PROVIDER_SITE_OTHER): Payer: Commercial Managed Care - HMO | Admitting: Physician Assistant

## 2016-11-30 ENCOUNTER — Telehealth: Payer: Self-pay | Admitting: Internal Medicine

## 2016-11-30 VITALS — BP 125/83 | HR 59 | Temp 98.4°F | Resp 18 | Ht 65.75 in | Wt 182.2 lb

## 2016-11-30 DIAGNOSIS — G4459 Other complicated headache syndrome: Secondary | ICD-10-CM

## 2016-11-30 DIAGNOSIS — R42 Dizziness and giddiness: Secondary | ICD-10-CM | POA: Diagnosis not present

## 2016-11-30 DIAGNOSIS — R9431 Abnormal electrocardiogram [ECG] [EKG]: Secondary | ICD-10-CM

## 2016-11-30 DIAGNOSIS — R51 Headache: Secondary | ICD-10-CM

## 2016-11-30 DIAGNOSIS — E162 Hypoglycemia, unspecified: Secondary | ICD-10-CM | POA: Diagnosis not present

## 2016-11-30 DIAGNOSIS — R519 Headache, unspecified: Secondary | ICD-10-CM

## 2016-11-30 DIAGNOSIS — E161 Other hypoglycemia: Secondary | ICD-10-CM | POA: Diagnosis not present

## 2016-11-30 LAB — GLUCOSE, POCT (MANUAL RESULT ENTRY): POC Glucose: 92 mg/dl (ref 70–99)

## 2016-11-30 MED ORDER — TIZANIDINE HCL 4 MG PO CAPS: 4.0000 mg | ORAL_CAPSULE | Freq: Three times a day (TID) | ORAL | 1 refills | Status: DC | PRN

## 2016-11-30 NOTE — Telephone Encounter (Signed)
Patient Name: Michelle Molina (DEE) Delfavero DOB: Mar 23, 1966 Initial Comment Caller was seen by the EMT's yesterday. Nurse Assessment Nurse: Bing PlumeHaynes, RN, Erin Date/Time (Eastern Time): 11/30/2016 9:03:59 AM Confirm and document reason for call. If symptomatic, describe symptoms. ---caller states: yesterday at work they called EMT's because she was sweaty, nauseated. Told her her blood sugar was low and ekg was abnormal. Told her to call the office this morning and she did but no appts open. Has been having headache for 3 days Does the patient have any new or worsening symptoms? ---Yes Will a triage be completed? ---Yes Related visit to physician within the last 2 weeks? ---No Does the PT have any chronic conditions? (i.e. diabetes, asthma, etc.) ---No Is the patient pregnant or possibly pregnant? (Ask all females between the ages of 5412-55) ---No Is this a behavioral health or substance abuse call? ---No Guidelines Guideline Title Affirmed Question Affirmed Notes Headache [1] MODERATE headache (e.g., interferes with normal activities) AND [2] present > 24 hours AND [3] unexplained (Exceptions: analgesics not tried, typical migraine, or headache part of viral illness) Final Disposition User See Physician within 24 Hours NorwalkHaynes, RN, Denny Peonrin Comments headache is lingering and nagging...not the worst Ive ever had Iron deficiency pt stated she called the office and no appointment was available then she called us Appt opened up in the schedule. Attempted to reach caller to offer that time but the call went to voicemail. Referrals [facility] Disagree/Comply: Comply
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
## 2016-11-30 NOTE — Patient Instructions (Addendum)
  I will contact you with the results of your bloodwork.  If you have any shortness of breath, dizziness, chest discomfort--please immediately go to the emergency department.     IF you received an x-ray today, you will receive an invoice from [facility]. Please contact [facility] at [phone] with questions or concerns regarding your invoice.   IF you received labwork today, you will receive an invoice from [facility]. Please contact [facility] at [phone] with questions or concerns regarding your invoice.   Our billing staff will not be able to assist you with questions regarding bills from these companies.  You will be contacted with the lab results as soon as they are available. The fastest way to get your results is to activate your My Chart account. Instructions are located on the last page of this paperwork. If you have not heard from us regarding the results in 2 weeks, please contact this office.

## 2016-12-01 LAB — CBC
HEMATOCRIT: 40.5 % (ref 34.0–46.6)
Hemoglobin: 13.7 g/dL (ref 11.1–15.9)
MCH: 28.1 pg (ref 26.6–33.0)
MCHC: 33.8 g/dL (ref 31.5–35.7)
MCV: 83 fL (ref 79–97)
PLATELETS: 180 10*3/uL (ref 150–379)
RBC: 4.88 x10E6/uL (ref 3.77–5.28)
RDW: 15.5 % — AB (ref 12.3–15.4)
WBC: 3 10*3/uL — ABNORMAL LOW (ref 3.4–10.8)

## 2016-12-01 LAB — TSH: TSH: 2.19 u[IU]/mL (ref 0.450–4.500)

## 2016-12-01 LAB — VITAMIN B12: Vitamin B-12: 554 pg/mL (ref 232–1245)

## 2016-12-28 ENCOUNTER — Ambulatory Visit: Payer: Self-pay | Admitting: Hematology and Oncology

## 2016-12-28 ENCOUNTER — Other Ambulatory Visit: Payer: Self-pay

## 2017-01-14 DIAGNOSIS — N926 Irregular menstruation, unspecified: Secondary | ICD-10-CM | POA: Diagnosis not present

## 2017-01-14 DIAGNOSIS — R102 Pelvic and perineal pain: Secondary | ICD-10-CM | POA: Diagnosis not present

## 2017-01-23 DIAGNOSIS — M25512 Pain in left shoulder: Secondary | ICD-10-CM | POA: Diagnosis not present

## 2017-01-23 DIAGNOSIS — S4992XA Unspecified injury of left shoulder and upper arm, initial encounter: Secondary | ICD-10-CM | POA: Diagnosis not present

## 2017-01-23 DIAGNOSIS — R5383 Other fatigue: Secondary | ICD-10-CM | POA: Diagnosis not present

## 2017-01-23 DIAGNOSIS — Z79899 Other long term (current) drug therapy: Secondary | ICD-10-CM | POA: Diagnosis not present

## 2017-01-30 DIAGNOSIS — Z1389 Encounter for screening for other disorder: Secondary | ICD-10-CM | POA: Diagnosis not present

## 2017-01-30 DIAGNOSIS — Z0000 Encounter for general adult medical examination without abnormal findings: Secondary | ICD-10-CM | POA: Diagnosis not present

## 2017-01-30 DIAGNOSIS — R0602 Shortness of breath: Secondary | ICD-10-CM | POA: Diagnosis not present

## 2017-02-04 DIAGNOSIS — N924 Excessive bleeding in the premenopausal period: Secondary | ICD-10-CM | POA: Diagnosis not present

## 2017-02-04 DIAGNOSIS — G4700 Insomnia, unspecified: Secondary | ICD-10-CM | POA: Diagnosis not present

## 2017-02-27 NOTE — Assessment & Plan Note (Deleted)
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 Patient has prior history of iron deficiency anemia due to fibroids causing heavy menstrual bleeding which resolved after myomectomy in 2010. Gastric bypass surgery done 2013  Prior treatment: Patient was on oral iron supplementation since 2010 but without adequate response (confirming malabsorption); IV Iron 12/31/2014 with excellent response IV iron 01/13/2016  Labs Reviewed: Ferritin is 5, hemoglobin is 12.4, iron saturation is 28%.  RTC in 6 months

## 2017-02-28 ENCOUNTER — Ambulatory Visit: Payer: Self-pay | Admitting: Hematology and Oncology

## 2017-02-28 ENCOUNTER — Other Ambulatory Visit: Payer: Self-pay

## 2017-02-28 NOTE — Progress Notes (Deleted)
Patient Care Team: Etta GrandchildJones, Thomas L, MD as PCP - General (Internal Medicine)  DIAGNOSIS:  Encounter Diagnosis  Name Primary?  . 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 She has had previous iron infusions once a year. She is here for a follow-up and is here to discuss the results of the recently performed blood work.  REVIEW OF SYSTEMS:   Constitutional: Denies fevers, chills or abnormal weight loss Eyes: Denies blurriness of vision Ears, nose, mouth, throat, and face: Denies mucositis or sore throat Respiratory: Denies cough, dyspnea or wheezes Cardiovascular: Denies palpitation, chest discomfort Gastrointestinal:  Denies nausea, heartburn or change in bowel habits Skin: Denies abnormal skin rashes Lymphatics: Denies new lymphadenopathy or easy bruising Neurological:Denies numbness, tingling or new weaknesses Behavioral/Psych: Mood is stable, no new changes  Extremities: No lower extremity edema All other systems were reviewed with the patient and are negative.  I have reviewed the past medical history, past surgical history, social history and family history with the patient and they are unchanged from previous note.  ALLERGIES:  is allergic to aspirin; ibuprofen; and percocet [oxycodone-acetaminophen].  MEDICATIONS:  Current Outpatient Prescriptions  Medication Sig Dispense Refill  . acetaminophen (TYLENOL) 500 MG tablet Take 1,000 mg by mouth every 6 (six) hours as needed for mild pain.    . Calcium 600-400 MG-UNIT CHEW Chew 1 tablet by mouth 2 (two) times daily.    . Cholecalciferol (VITAMIN D3) 1000 UNITS CAPS Take 1 capsule by mouth daily.     . Cyanocobalamin (VITAMIN B 12 PO) Take 1 tablet by mouth daily.    . folic acid (FOLVITE) 1 MG tablet Take 1 mg by  mouth daily.    Marland Kitchen. GELATIN PO Take 1 capsule by mouth daily.    . LUTEIN PO Take 1 capsule by mouth daily.     . Multiple Vitamin (MULITIVITAMIN WITH MINERALS) TABS Take 1 tablet by mouth 2 (two) times daily.     . Omega-3 Fatty Acids (FISH OIL) 1000 MG CAPS Take 1 g by mouth daily.     . Probiotic Product (PROBIOTIC DAILY PO) Take 1 tablet by mouth.    . prochlorperazine (COMPAZINE) 10 MG tablet Take 1 tablet (10 mg total) by mouth every 6 (six) hours as needed for nausea or vomiting. 30 tablet 0  . tiZANidine (ZANAFLEX) 4 MG capsule Take 1 capsule (4 mg total) by mouth 3 (three) times daily as needed for muscle spasms. 21 capsule 1   No current facility-administered medications for this visit.     PHYSICAL EXAMINATION: ECOG PERFORMANCE STATUS: 1 - Symptomatic but completely ambulatory  There were no vitals filed for this visit. There were no vitals filed for this visit.  GENERAL:alert, no distress and comfortable SKIN: skin color, texture, turgor are normal, no rashes or significant lesions EYES: normal, Conjunctiva are pink and non-injected, sclera clear OROPHARYNX:no exudate, no erythema and lips, buccal mucosa, and tongue normal  NECK: supple, thyroid normal size, non-tender, without nodularity LYMPH:  no palpable lymphadenopathy in the cervical, axillary or inguinal LUNGS: clear to auscultation and percussion with normal breathing effort HEART: regular rate & rhythm and no murmurs and no lower extremity edema ABDOMEN:abdomen soft, non-tender and normal bowel sounds MUSCULOSKELETAL:no cyanosis of digits and no clubbing  NEURO: alert & oriented x 3 with fluent speech,  no focal motor/sensory deficits EXTREMITIES: No lower extremity edema  LABORATORY DATA:  I have reviewed the data as listed   Chemistry      Component Value Date/Time   NA 140 01/06/2016 0808   K 4.2 01/06/2016 0808   CL 107 12/26/2015 0932   CL 110 (H) 06/24/2012 1416   CO2 23 01/06/2016 0808   BUN 8.6  01/06/2016 0808   CREATININE 0.8 01/06/2016 0808      Component Value Date/Time   CALCIUM 8.9 01/06/2016 0808   ALKPHOS 106 01/06/2016 0808   AST 19 01/06/2016 0808   ALT 16 01/06/2016 0808   BILITOT 0.97 01/06/2016 0808       Lab Results  Component Value Date   WBC 3.0 (L) 11/30/2016   HGB 13.7 11/30/2016   HCT 40.5 11/30/2016   MCV 83 11/30/2016   PLT 180 11/30/2016   NEUTROABS 1.4 (L) 08/23/2016    ASSESSMENT & PLAN:  Anemia, iron deficiency 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 Patient has prior history of iron deficiency anemia due to fibroids causing heavy menstrual bleeding which resolved after myomectomy in 2010. Gastric bypass surgery done 2013  Prior treatment: Patient was on oral iron supplementation since 2010 but without adequate response (confirming malabsorption); IV Iron 12/31/2014 with excellent response IV iron 01/13/2016  Labs Reviewed: Ferritin is 5, hemoglobin is 12.4, iron saturation is 28%.  RTC in 6 months   I spent 25 minutes talking to the patient of which more than half was spent in counseling and coordination of care.  No orders of the defined types were placed in this encounter.  The patient has a good understanding of the overall plan. she agrees with it. she will call with any problems that may develop before the next visit here.   Sabas Sous, MD 02/28/17

## 2017-05-19 IMAGING — CR DG CHEST 2V
2 series · 2 of 2 positions shown · non-contrast
Comparison: CT chest 03/05/2012

CLINICAL DATA: Chest pain, shortness of breath, dizziness for 2
days

EXAM:
CHEST  2 VIEW

[chest pa]
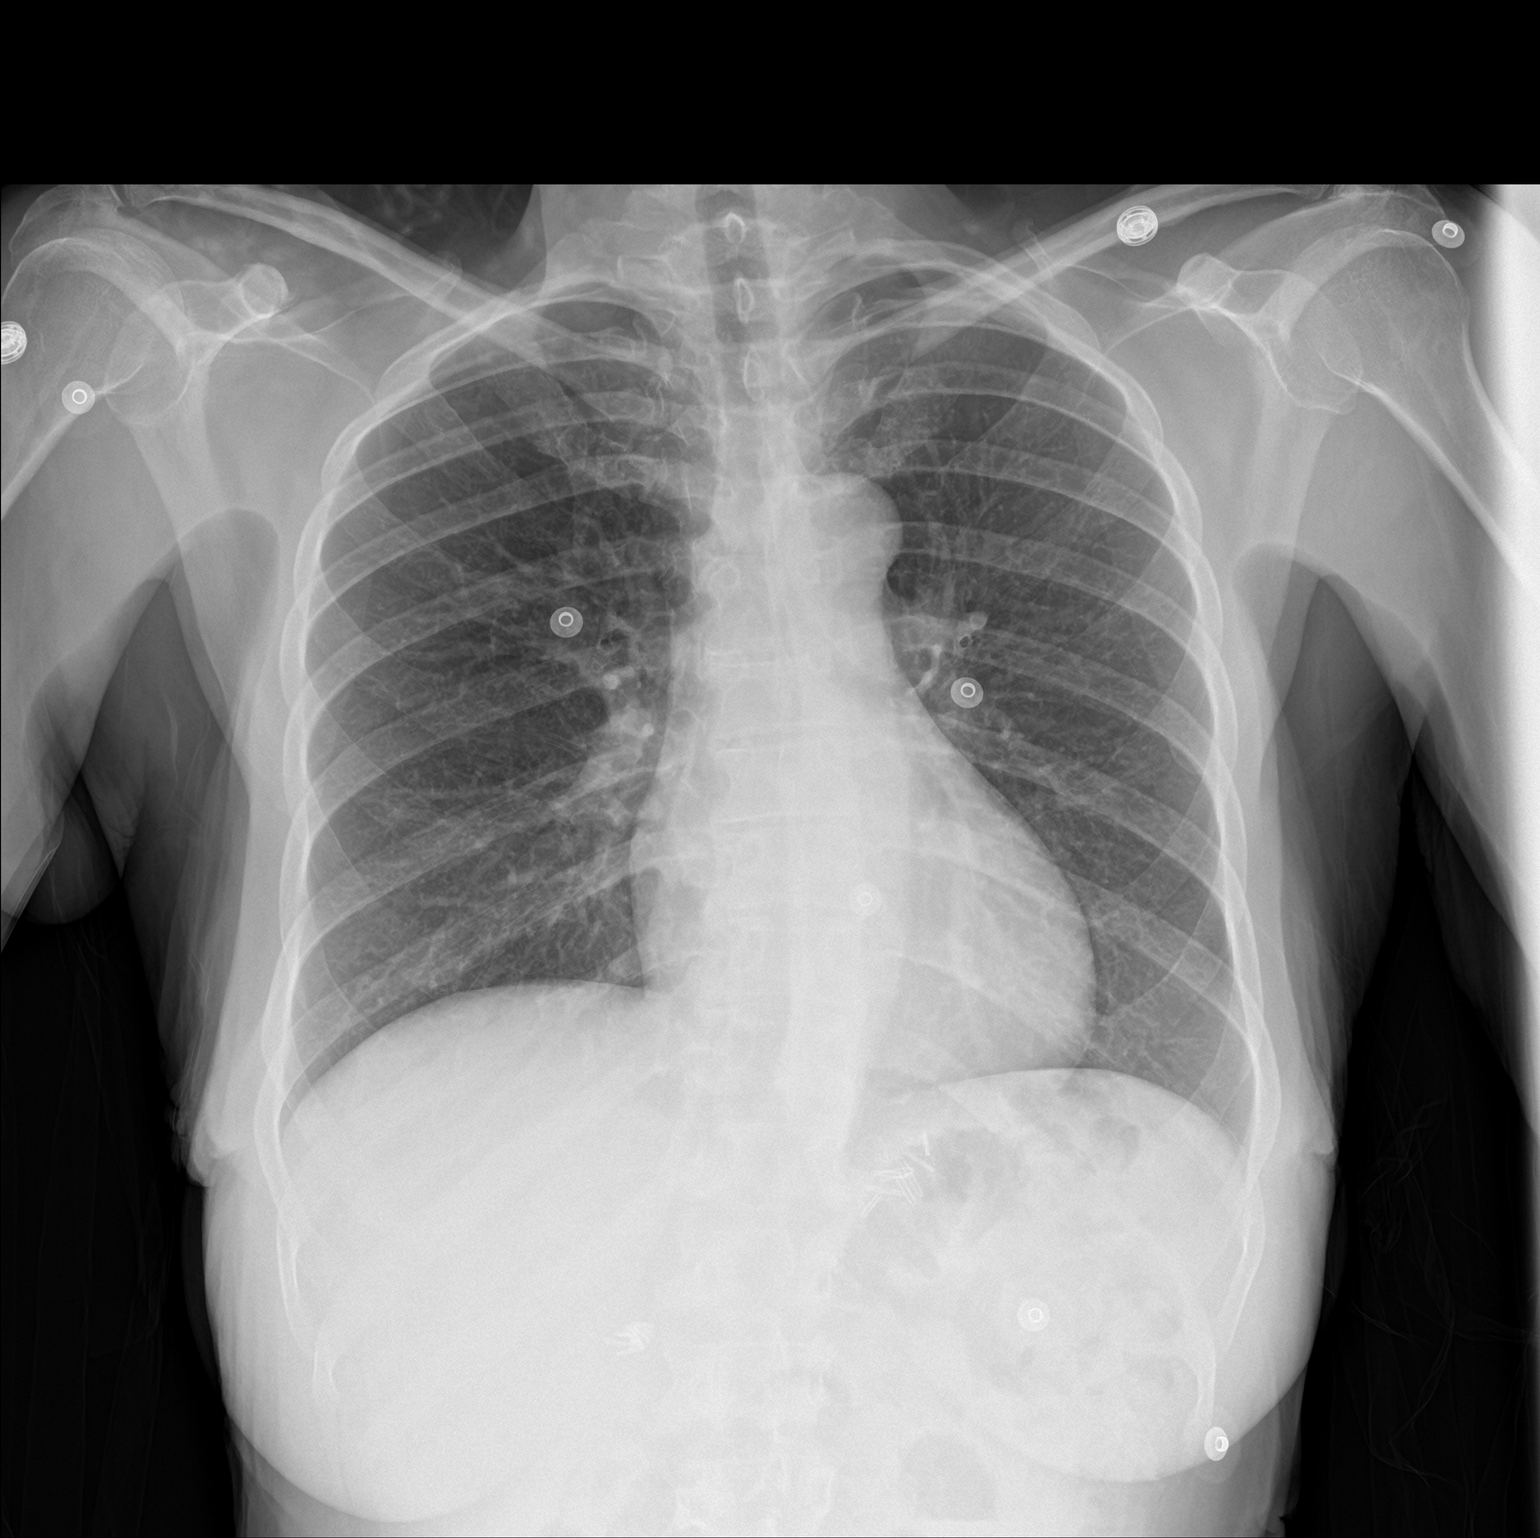

[chest lat]
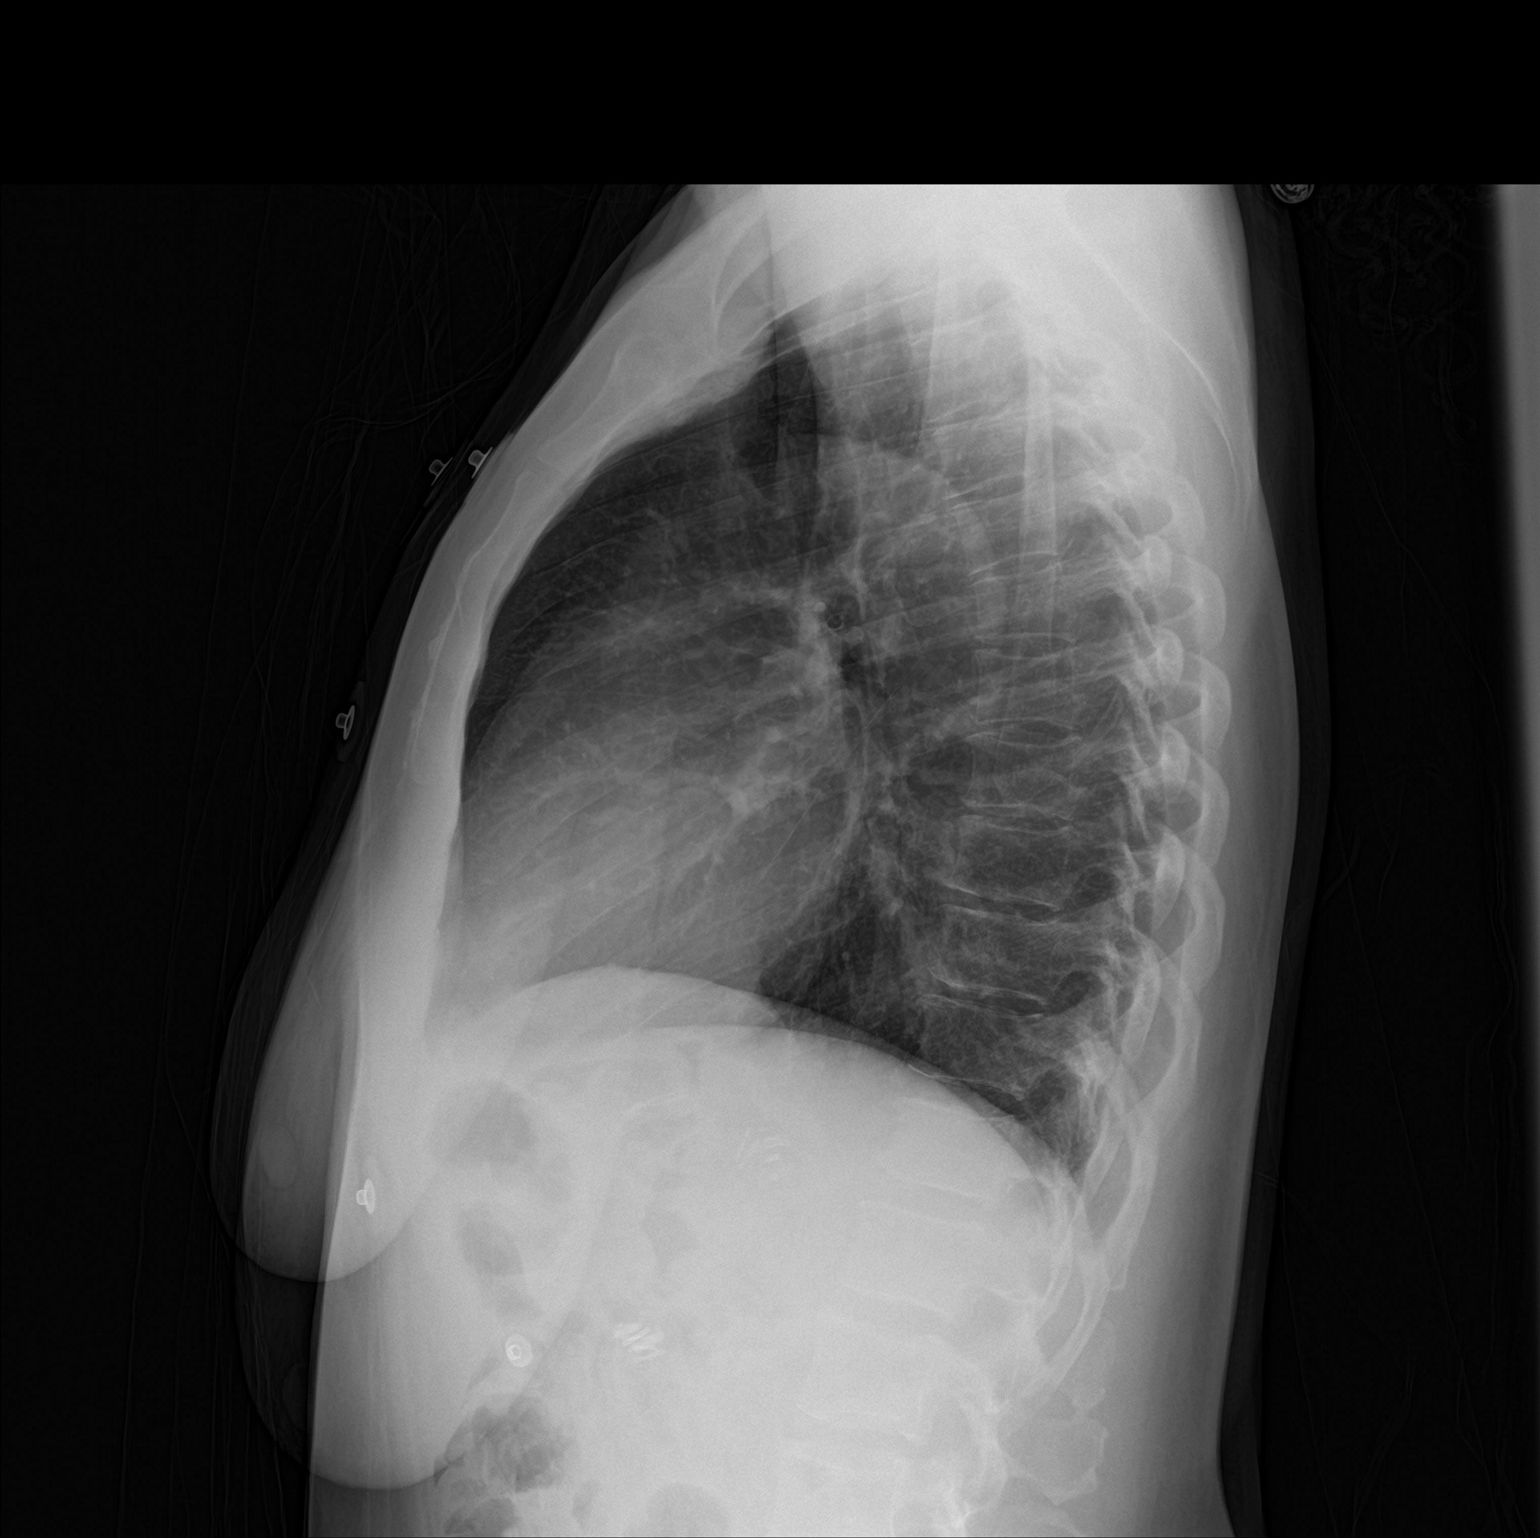

[2 of 2 positions shown; findings below may reference images not displayed]

FINDINGS: The heart size and mediastinal contours are within normal limits.
Both lungs are clear. The visualized skeletal structures are
unremarkable.
IMPRESSION: No active cardiopulmonary disease.

## 2017-08-19 DIAGNOSIS — Z780 Asymptomatic menopausal state: Secondary | ICD-10-CM | POA: Diagnosis not present

## 2017-08-19 DIAGNOSIS — Z01419 Encounter for gynecological examination (general) (routine) without abnormal findings: Secondary | ICD-10-CM | POA: Diagnosis not present

## 2017-08-19 DIAGNOSIS — N951 Menopausal and female climacteric states: Secondary | ICD-10-CM | POA: Diagnosis not present

## 2017-08-19 DIAGNOSIS — Z6830 Body mass index (BMI) 30.0-30.9, adult: Secondary | ICD-10-CM | POA: Diagnosis not present

## 2017-08-20 LAB — HM MAMMOGRAPHY: HM Mammogram: NORMAL (ref 0–4)

## 2017-08-20 LAB — HM PAP SMEAR

## 2017-09-03 DIAGNOSIS — Z719 Counseling, unspecified: Secondary | ICD-10-CM | POA: Diagnosis not present

## 2017-09-10 ENCOUNTER — Other Ambulatory Visit (INDEPENDENT_AMBULATORY_CARE_PROVIDER_SITE_OTHER): Payer: 59

## 2017-09-10 ENCOUNTER — Encounter: Payer: Self-pay | Admitting: Internal Medicine

## 2017-09-10 ENCOUNTER — Ambulatory Visit (INDEPENDENT_AMBULATORY_CARE_PROVIDER_SITE_OTHER): Payer: 59 | Admitting: Internal Medicine

## 2017-09-10 VITALS — BP 100/70 | HR 75 | Temp 98.5°F | Resp 16 | Ht 65.0 in | Wt 183.1 lb

## 2017-09-10 DIAGNOSIS — Z0000 Encounter for general adult medical examination without abnormal findings: Secondary | ICD-10-CM

## 2017-09-10 DIAGNOSIS — Z23 Encounter for immunization: Secondary | ICD-10-CM | POA: Diagnosis not present

## 2017-09-10 DIAGNOSIS — D508 Other iron deficiency anemias: Secondary | ICD-10-CM

## 2017-09-10 DIAGNOSIS — Z9884 Bariatric surgery status: Secondary | ICD-10-CM

## 2017-09-10 LAB — CBC WITH DIFFERENTIAL/PLATELET
Basophils Absolute: 0.1 10*3/uL (ref 0.0–0.1)
Basophils Relative: 1.5 % (ref 0.0–3.0)
EOS ABS: 0.1 10*3/uL (ref 0.0–0.7)
EOS PCT: 1.4 % (ref 0.0–5.0)
HEMATOCRIT: 39 % (ref 36.0–46.0)
HEMOGLOBIN: 12.5 g/dL (ref 12.0–15.0)
LYMPHS PCT: 41.6 % (ref 12.0–46.0)
Lymphs Abs: 1.6 10*3/uL (ref 0.7–4.0)
MCHC: 31.9 g/dL (ref 30.0–36.0)
MCV: 79.1 fl (ref 78.0–100.0)
Monocytes Absolute: 0.3 10*3/uL (ref 0.1–1.0)
Monocytes Relative: 7.5 % (ref 3.0–12.0)
Neutro Abs: 1.9 10*3/uL (ref 1.4–7.7)
Neutrophils Relative %: 48 % (ref 43.0–77.0)
Platelets: 199 10*3/uL (ref 150.0–400.0)
RBC: 4.93 Mil/uL (ref 3.87–5.11)
RDW: 16.8 % — AB (ref 11.5–15.5)
WBC: 3.9 10*3/uL — AB (ref 4.0–10.5)

## 2017-09-10 LAB — VITAMIN D 25 HYDROXY (VIT D DEFICIENCY, FRACTURES): VITD: 44.04 ng/mL (ref 30.00–100.00)

## 2017-09-10 LAB — FOLATE

## 2017-09-10 LAB — VITAMIN B12: Vitamin B-12: 398 pg/mL (ref 211–911)

## 2017-09-10 LAB — LIPID PANEL
Cholesterol: 151 mg/dL (ref 0–200)
HDL: 58.6 mg/dL (ref >39.00)
LDL Cholesterol: 81 mg/dL (ref 0–99)
NONHDL: 92.52
Total CHOL/HDL Ratio: 3
Triglycerides: 56 mg/dL (ref 0.0–149.0)
VLDL: 11.2 mg/dL (ref 0.0–40.0)

## 2017-09-10 LAB — IBC PANEL
IRON: 18 ug/dL — AB (ref 42–145)
SATURATION RATIOS: 4.3 % — AB (ref 20.0–50.0)
TRANSFERRIN: 300 mg/dL (ref 212.0–360.0)

## 2017-09-10 LAB — FERRITIN: Ferritin: 7.2 ng/mL — ABNORMAL LOW (ref 10.0–291.0)

## 2017-09-10 MED ORDER — FERROUS SULFATE 325 (65 FE) MG PO TABS: 325.0000 mg | ORAL_TABLET | Freq: Two times a day (BID) | ORAL | 1 refills | Status: DC

## 2017-09-10 NOTE — Progress Notes (Signed)
Subjective:  Patient ID: Michelle Molina, female    DOB: 04/05/1966  Age: 52 y.o. MRN: [id number]  CC: Anemia and Annual Exam   HPI Michelle Molina presents for a CPX.  She complains for 2 months that she has had an intermittent sensation of warmth on the back of her neck.  He has not seen a rash and denies itching, skin changes, or neck pain.  She is status post bariatric surgery and she tells me she is taking folic acid, B complex, and vitamin D but she is not taking an iron supplement.  She has had a few episodes of feeling shaky and dizzy but she denies shortness of breath, fatigue, or sources of blood loss.  Outpatient Medications Prior to Visit  Medication Sig Dispense Refill  . acetaminophen (TYLENOL) 500 MG tablet Take 1,000 mg by mouth every 6 (six) hours as needed for mild pain.    . Calcium 600-400 MG-UNIT CHEW Chew 1 tablet by mouth 2 (two) times daily.    . Cholecalciferol (VITAMIN D3) 1000 UNITS CAPS Take 1 capsule by mouth daily.     . Cyanocobalamin (VITAMIN B 12 PO) Take 1 tablet by mouth daily.    . folic acid (FOLVITE) 1 MG tablet Take 1 mg by mouth daily.    . Multiple Vitamin (MULITIVITAMIN WITH MINERALS) TABS Take 1 tablet by mouth 2 (two) times daily.     Marland Kitchen GELATIN PO Take 1 capsule by mouth daily.    . LUTEIN PO Take 1 capsule by mouth daily.     . Omega-3 Fatty Acids (FISH OIL) 1000 MG CAPS Take 1 g by mouth daily.     . Probiotic Product (PROBIOTIC DAILY PO) Take 1 tablet by mouth.    . prochlorperazine (COMPAZINE) 10 MG tablet Take 1 tablet (10 mg total) by mouth every 6 (six) hours as needed for nausea or vomiting. (Patient not taking: Reported on 09/10/2017) 30 tablet 0  . tiZANidine (ZANAFLEX) 4 MG capsule Take 1 capsule (4 mg total) by mouth 3 (three) times daily as needed for muscle spasms. (Patient not taking: Reported on 09/10/2017) 21 capsule 1   No facility-administered medications prior to visit.     ROS Review of Systems  Constitutional: Negative.   Negative for diaphoresis, fatigue and unexpected weight change.  HENT: Negative.   Eyes: Negative for visual disturbance.  Respiratory: Negative for cough, chest tightness, shortness of breath and wheezing.   Cardiovascular: Negative for chest pain, palpitations and leg swelling.  Gastrointestinal: Negative for abdominal pain, constipation, diarrhea, nausea and vomiting.  Genitourinary: Negative.  Negative for difficulty urinating, vaginal bleeding, vaginal discharge and vaginal pain.  Musculoskeletal: Negative.  Negative for arthralgias, myalgias and neck pain.  Skin: Negative.  Negative for color change, pallor and rash.  Neurological: Negative.  Negative for dizziness, weakness, light-headedness and headaches.  Hematological: Negative for adenopathy. Does not bruise/bleed easily.  Psychiatric/Behavioral: Negative.     Objective:  BP 100/70 (BP Location: Left Arm, Patient Position: Sitting, Cuff Size: Large)   Pulse 75   Temp 98.5 F (36.9 C) (Oral)   Resp 16   Ht 5\' 5"  (1.651 m)   Wt 183 lb 1.3 oz (83 kg)   SpO2 95%   BMI 30.47 kg/m   BP Readings from Last 3 Encounters:  09/10/17 100/70  11/30/16 125/83  09/11/16 120/76    Wt Readings from Last 3 Encounters:  09/10/17 183 lb 1.3 oz (83 kg)  11/30/16 182 lb 3.2  oz (82.6 kg)  09/11/16 176 lb 1.3 oz (79.9 kg)    Physical Exam  Constitutional: She is oriented to person, place, and time. No distress.  HENT:  Mouth/Throat: Oropharynx is clear and moist. No oropharyngeal exudate.  Eyes: Conjunctivae are normal. Right eye exhibits no discharge. Left eye exhibits no discharge. No scleral icterus.  Neck: Normal range of motion. Neck supple. No JVD present. No thyromegaly present.  Cardiovascular: Normal rate, regular rhythm and normal heart sounds. Exam reveals no gallop.  No murmur heard. Pulmonary/Chest: Effort normal and breath sounds normal. No respiratory distress. She has no wheezes. She has no rales.  Abdominal: Soft.  Bowel sounds are normal. She exhibits no distension and no mass. There is no tenderness. There is no guarding.  Musculoskeletal: Normal range of motion. She exhibits no edema, tenderness or deformity.       Cervical back: Normal. She exhibits normal range of motion, no tenderness, no swelling, no edema, no deformity, no pain and no spasm.  Lymphadenopathy:    She has no cervical adenopathy.  Neurological: She is alert and oriented to person, place, and time.  Skin: Skin is warm and dry. No rash noted. She is not diaphoretic. No erythema. No pallor.  Psychiatric: She has a normal mood and affect. Her behavior is normal. Judgment and thought content normal.  Vitals reviewed.   Lab Results  Component Value Date   WBC 3.9 (L) 09/10/2017   HGB 12.5 09/10/2017   HCT 39.0 09/10/2017   PLT 199.0 09/10/2017   GLUCOSE 95 01/06/2016   CHOL 151 09/10/2017   TRIG 56.0 09/10/2017   HDL 58.60 09/10/2017   LDLCALC 81 09/10/2017   ALT 16 01/06/2016   AST 19 01/06/2016   NA 140 01/06/2016   K 4.2 01/06/2016   CL 107 12/26/2015   CREATININE 0.8 01/06/2016   BUN 8.6 01/06/2016   CO2 23 01/06/2016   TSH 2.190 11/30/2016   HGBA1C 5.3 12/08/2012    Dg Abd Acute W/chest  Result Date: 09/11/2016 CLINICAL DATA:  Generalized abdominal cramping, nausea, vomiting and diarrhea. EXAM: DG ABDOMEN ACUTE W/ 1V CHEST COMPARISON:  12/26/2015 chest radiograph. 11/10/2013 CT abdomen/pelvis. FINDINGS: Stable cardiomediastinal silhouette with normal heart size. No pneumothorax. No pleural effusion. Lungs appear clear, with no acute consolidative airspace disease and no pulmonary edema. Surgical clips are seen in the right upper quadrant and in the region of the esophagogastric junction, unchanged. Surgical clip overlies the right deep pelvis, unchanged. No dilated small bowel loops. Scattered mild fluid levels throughout the periphery of the abdomen, probably within the large bowel. Mild physiologic colonic stool  volume. No evidence of pneumatosis or pneumoperitoneum. Surgical sutures overlie the left mid abdomen. No radiopaque urolithiasis. Degenerative changes in the lower lumbar spine. IMPRESSION: 1. No active disease in the chest. 2. Nonobstructive bowel gas pattern. 3. Scattered mild fluid levels throughout the periphery of the abdomen, probably within the large bowel, which may indicate a diarrheal state. No free intraperitoneal air. Electronically Signed   By: Delbert PhenixJason A Poff M.D.   On: 09/11/2016 14:31    Assessment & Plan:   Jacquiline Doeopaz was seen today for anemia and annual exam.  Diagnoses and all orders for this visit:  Other iron deficiency anemia-she is not anemic but her iron level is low.  Have asked her to start an iron supplement. -     CBC with Differential/Platelet; Future -     IBC panel; Future -     Ferritin; Future -  ferrous sulfate 325 (65 FE) MG tablet; Take 1 tablet (325 mg total) by mouth 2 (two) times daily with a meal.  Status post gastric bypass for obesity-we will screen for vitamin deficiencies. -     Vitamin B12; Future -     CBC with Differential/Platelet; Future -     IBC panel; Future -     Folate; Future -     Ferritin; Future -     Vitamin B1; Future -     Zinc; Future -     VITAMIN D 25 Hydroxy (Vit-D Deficiency, Fractures); Future -     Vitamin B6; Future  Routine general medical examination at a health care facility- Exam completed, labs reviewed, she refused a flu vaccine, Tdap was updated, Pap and mammogram were completed 3 weeks ago by her GYN, she is having a colonoscopy later this week, examination of her neck is normal I do not have any explanation for her symptoms. -     Lipid panel; Future -     HIV antibody; Future  Need for Tdap vaccination -     Tdap vaccine greater than or equal to 7yo IM   I have discontinued Rieley D. Eickhoff's Fish Oil, LUTEIN PO, GELATIN PO, Probiotic Product (PROBIOTIC DAILY PO), prochlorperazine, and tiZANidine. I am also  having her start on ferrous sulfate. Additionally, I am having her maintain her multivitamin with minerals, Calcium, acetaminophen, Vitamin D3, folic acid, and Cyanocobalamin (VITAMIN B 12 PO).  Meds ordered this encounter  Medications  . ferrous sulfate 325 (65 FE) MG tablet    Sig: Take 1 tablet (325 mg total) by mouth 2 (two) times daily with a meal.    Dispense:  180 tablet    Refill:  1     Follow-up: Return in about 6 months (around 03/13/2018).  Sanda Linger, MD

## 2017-09-10 NOTE — Patient Instructions (Signed)

## 2017-09-12 DIAGNOSIS — K5730 Diverticulosis of large intestine without perforation or abscess without bleeding: Secondary | ICD-10-CM | POA: Diagnosis not present

## 2017-09-12 DIAGNOSIS — K648 Other hemorrhoids: Secondary | ICD-10-CM | POA: Diagnosis not present

## 2017-09-12 DIAGNOSIS — Z1211 Encounter for screening for malignant neoplasm of colon: Secondary | ICD-10-CM | POA: Diagnosis not present

## 2017-09-12 LAB — HM COLONOSCOPY

## 2017-09-14 ENCOUNTER — Encounter: Payer: Self-pay | Admitting: Internal Medicine

## 2017-09-14 LAB — VITAMIN B6: VITAMIN B6: 10 ng/mL (ref 2.1–21.7)

## 2017-09-14 LAB — VITAMIN B1: Vitamin B1 (Thiamine): 7 nmol/L — ABNORMAL LOW (ref 8–30)

## 2017-09-14 LAB — HIV ANTIBODY (ROUTINE TESTING W REFLEX): HIV: NONREACTIVE

## 2017-09-14 LAB — ZINC: ZINC: 73 ug/dL (ref 60–130)

## 2017-09-15 ENCOUNTER — Other Ambulatory Visit: Payer: Self-pay | Admitting: Internal Medicine

## 2017-09-15 ENCOUNTER — Encounter: Payer: Self-pay | Admitting: Internal Medicine

## 2017-09-15 DIAGNOSIS — E519 Thiamine deficiency, unspecified: Secondary | ICD-10-CM

## 2017-09-15 DIAGNOSIS — D508 Other iron deficiency anemias: Secondary | ICD-10-CM

## 2017-09-15 MED ORDER — VITAMIN B-1 100 MG PO TABS
100.0000 mg | ORAL_TABLET | Freq: Every day | ORAL | 1 refills | Status: DC
Start: 2017-09-15 — End: 2017-09-16

## 2017-09-16 MED ORDER — FERROUS SULFATE 325 (65 FE) MG PO TABS: 325.0000 mg | ORAL_TABLET | Freq: Two times a day (BID) | ORAL | 1 refills | Status: DC

## 2017-09-16 MED ORDER — VITAMIN B-1 100 MG PO TABS
100.0000 mg | ORAL_TABLET | Freq: Every day | ORAL | 1 refills | Status: DC
Start: 2017-09-16 — End: 2020-03-30

## 2017-09-30 DIAGNOSIS — H8309 Labyrinthitis, unspecified ear: Secondary | ICD-10-CM | POA: Diagnosis not present

## 2017-09-30 DIAGNOSIS — R040 Epistaxis: Secondary | ICD-10-CM | POA: Diagnosis not present

## 2017-10-02 ENCOUNTER — Ambulatory Visit: Payer: Self-pay | Admitting: Hematology and Oncology

## 2017-10-02 ENCOUNTER — Encounter: Payer: Self-pay | Admitting: Physician Assistant

## 2018-01-08 DIAGNOSIS — Z6830 Body mass index (BMI) 30.0-30.9, adult: Secondary | ICD-10-CM | POA: Diagnosis not present

## 2018-01-08 DIAGNOSIS — Z9884 Bariatric surgery status: Secondary | ICD-10-CM | POA: Diagnosis not present

## 2018-01-26 DIAGNOSIS — J0140 Acute pansinusitis, unspecified: Secondary | ICD-10-CM | POA: Diagnosis not present

## 2018-01-26 DIAGNOSIS — M542 Cervicalgia: Secondary | ICD-10-CM | POA: Diagnosis not present

## 2018-04-02 DIAGNOSIS — Z9884 Bariatric surgery status: Secondary | ICD-10-CM | POA: Diagnosis not present

## 2018-04-02 DIAGNOSIS — Z6828 Body mass index (BMI) 28.0-28.9, adult: Secondary | ICD-10-CM | POA: Diagnosis not present

## 2018-05-26 DIAGNOSIS — H8110 Benign paroxysmal vertigo, unspecified ear: Secondary | ICD-10-CM | POA: Diagnosis not present

## 2018-07-23 DIAGNOSIS — Z9884 Bariatric surgery status: Secondary | ICD-10-CM | POA: Diagnosis not present

## 2018-07-23 DIAGNOSIS — Z6828 Body mass index (BMI) 28.0-28.9, adult: Secondary | ICD-10-CM | POA: Diagnosis not present

## 2018-08-01 DIAGNOSIS — Z719 Counseling, unspecified: Secondary | ICD-10-CM | POA: Diagnosis not present

## 2018-08-11 DIAGNOSIS — Z719 Counseling, unspecified: Secondary | ICD-10-CM | POA: Diagnosis not present

## 2018-09-02 DIAGNOSIS — Z719 Counseling, unspecified: Secondary | ICD-10-CM | POA: Diagnosis not present

## 2018-09-03 DIAGNOSIS — Z719 Counseling, unspecified: Secondary | ICD-10-CM | POA: Diagnosis not present

## 2018-09-09 ENCOUNTER — Ambulatory Visit: Payer: Self-pay | Admitting: Family Medicine

## 2018-09-17 DIAGNOSIS — Z719 Counseling, unspecified: Secondary | ICD-10-CM | POA: Diagnosis not present

## 2018-09-24 DIAGNOSIS — Z719 Counseling, unspecified: Secondary | ICD-10-CM | POA: Diagnosis not present

## 2018-09-25 DIAGNOSIS — Z6829 Body mass index (BMI) 29.0-29.9, adult: Secondary | ICD-10-CM | POA: Diagnosis not present

## 2018-09-25 DIAGNOSIS — Z01419 Encounter for gynecological examination (general) (routine) without abnormal findings: Secondary | ICD-10-CM | POA: Diagnosis not present

## 2018-09-25 DIAGNOSIS — N951 Menopausal and female climacteric states: Secondary | ICD-10-CM | POA: Diagnosis not present

## 2018-09-25 DIAGNOSIS — N926 Irregular menstruation, unspecified: Secondary | ICD-10-CM | POA: Diagnosis not present

## 2018-09-29 ENCOUNTER — Other Ambulatory Visit: Payer: Self-pay | Admitting: Obstetrics and Gynecology

## 2018-09-29 DIAGNOSIS — Z719 Counseling, unspecified: Secondary | ICD-10-CM | POA: Diagnosis not present

## 2018-09-29 DIAGNOSIS — R928 Other abnormal and inconclusive findings on diagnostic imaging of breast: Secondary | ICD-10-CM

## 2018-10-13 DIAGNOSIS — Z9884 Bariatric surgery status: Secondary | ICD-10-CM | POA: Diagnosis not present

## 2018-10-20 ENCOUNTER — Ambulatory Visit
Admission: RE | Admit: 2018-10-20 | Discharge: 2018-10-20 | Disposition: A | Discharge status: Home / Self Care | Payer: 59 | Source: Ambulatory Visit | Attending: Obstetrics and Gynecology | Admitting: Obstetrics and Gynecology

## 2018-10-20 ENCOUNTER — Other Ambulatory Visit: Payer: Self-pay

## 2018-10-20 ENCOUNTER — Ambulatory Visit: Payer: Self-pay

## 2018-10-20 DIAGNOSIS — R928 Other abnormal and inconclusive findings on diagnostic imaging of breast: Secondary | ICD-10-CM

## 2018-10-31 ENCOUNTER — Ambulatory Visit (HOSPITAL_COMMUNITY)
Admission: EM | Admit: 2018-10-31 | Discharge: 2018-10-31 | Disposition: A | Discharge status: Home / Self Care | Payer: 59

## 2018-10-31 ENCOUNTER — Encounter (HOSPITAL_COMMUNITY): Payer: Self-pay

## 2018-10-31 ENCOUNTER — Other Ambulatory Visit: Payer: Self-pay

## 2018-10-31 DIAGNOSIS — G44209 Tension-type headache, unspecified, not intractable: Secondary | ICD-10-CM

## 2018-10-31 DIAGNOSIS — R079 Chest pain, unspecified: Secondary | ICD-10-CM | POA: Diagnosis not present

## 2018-10-31 MED ORDER — KETOROLAC TROMETHAMINE 30 MG/ML IJ SOLN
30.0000 mg | Freq: Once | INTRAMUSCULAR | Status: AC
  Administered 2018-10-31: 12:00:00 30 mg via INTRAMUSCULAR

## 2018-10-31 MED ORDER — KETOROLAC TROMETHAMINE 30 MG/ML IJ SOLN
INTRAMUSCULAR | Status: AC
  Filled 2018-10-31: qty 1

## 2018-10-31 NOTE — Discharge Instructions (Addendum)
Your EKG was normal. Lungs clear Nothing concerning here today Toradol given for headache. This should also help with the chest pain if it is muscle in nature.  If your symptoms do not improve or worsen you will need to go to the ER.

## 2018-10-31 NOTE — ED Triage Notes (Signed)
Patient presents to Urgent Care with complaints of headache, worse on the left side and radiating down into her nose to the point she feels like her nose is going to bleed since last night. Patient reports some slight chest tightness as well.

## 2018-11-01 DIAGNOSIS — D649 Anemia, unspecified: Secondary | ICD-10-CM | POA: Diagnosis not present

## 2018-11-01 DIAGNOSIS — R51 Headache: Secondary | ICD-10-CM | POA: Diagnosis not present

## 2018-11-03 DIAGNOSIS — G43009 Migraine without aura, not intractable, without status migrainosus: Secondary | ICD-10-CM | POA: Diagnosis not present

## 2018-11-03 DIAGNOSIS — D649 Anemia, unspecified: Secondary | ICD-10-CM | POA: Diagnosis not present

## 2018-11-03 NOTE — ED Provider Notes (Signed)
[facility]    CSN: [id number] Arrival date & time: 10/31/18  1043     History   Chief Complaint Chief Complaint  Patient presents with  . Appointment  . Headache    HPI Michelle Molina is a 53 y.o. female.   Pt is a 53 year old female that presents with headache and chest pain. The headache has been constant. Located on the right side, temple area and feels like tension. No associated nausea, vomiting, dizziness, blurred vision, photophobia or phonophobia.  She has taken Tylenol with some relief.  Denies any head injury or history of migraines.  She is also having intermittent, waxing and waning chest discomfort.  The discomfort is more with certain movements.  It is centralized in her chest.  Denies any associated cough, congestion, fevers, palpitations or diaphoresis.  Denies any family history of heart disease.  She does have a history of gastric ulcers, anemia and gastric bypass.   ROS per HPI      Past Medical History:  Diagnosis Date  . Allergy   . Anemia   . PONV (postoperative nausea and vomiting)   . Ulcer     Patient Active Problem List   Diagnosis Date Noted  . 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 Routine general medical examination at a health care facility 09/10/2017  . Anemia, iron deficiency 06/17/2013  . Status post gastric bypass for obesity 06/16/2013  . Depressed 11/28/2011    Past Surgical History:  Procedure Laterality Date  . CESAREAN SECTION  1997, 1999   x2  . CHOLECYSTECTOMY  1997  . GASTRIC BYPASS  2013  . HERNIA REPAIR    . HYSTEROSCOPY  2006  . KNEE SURGERY  2001   left  . MYOMECTOMY  2010  . SMALL INTESTINE SURGERY    . TUBAL LIGATION  2002    OB History   No obstetric history on file.      Home Medications    Prior to Admission medications   Medication Sig Start Date End Date Taking? Authorizing Provider  PHENTERMINE RESIN COMPLEX PO Take by mouth every morning.   Yes [provider]  acetaminophen  (TYLENOL) 500 MG tablet Take 1,000 mg by mouth every 6 (six) hours as needed for mild pain.    [provider]  Calcium 600-400 MG-UNIT CHEW Chew 1 tablet by mouth 2 (two) times daily.    [provider]  Cholecalciferol (VITAMIN D3) 1000 UNITS CAPS Take 1 capsule by mouth daily.     [provider]  Cyanocobalamin (VITAMIN B 12 PO) Take 1 tablet by mouth daily.    [provider]  ferrous sulfate 325 (65 FE) MG tablet Take 1 tablet (325 mg total) by mouth 2 (two) times daily with a meal. 09/16/17   Etta Grandchild, MD  folic acid (FOLVITE) 1 MG tablet Take 1 mg by mouth daily.    [provider]  Multiple Vitamin (MULITIVITAMIN WITH MINERALS) TABS Take 1 tablet by mouth 2 (two) times daily.     [provider]  thiamine (VITAMIN B-1) 100 MG tablet Take 1 tablet (100 mg total) by mouth daily. 09/16/17   Etta Grandchild, MD    Family History Family History  Problem Relation Age of Onset  . Uterine cancer Mother   . Hypertension Mother   . Hyperlipidemia Mother   . Breast cancer Maternal Aunt   . Lung cancer Maternal Uncle   . Diabetes Maternal Grandfather   .  Hyperlipidemia Maternal Grandfather   . Stroke Maternal Grandfather     Social History Social History   Tobacco Use  . Smoking status: Never Smoker  . Smokeless tobacco: Never Used  Substance Use Topics  . Alcohol use: No    Comment: occasionally  . Drug use: No     Allergies   Aspirin; Ibuprofen; and Percocet [oxycodone-acetaminophen]   Review of Systems Review of Systems   Physical Exam Triage Vital Signs ED Triage Vitals  Enc Vitals Group     BP 10/31/18 1057 (!) 162/91     Pulse Rate 10/31/18 1057 74     Resp 10/31/18 1057 18     Temp 10/31/18 1057 98.1 F (36.7 C)     Temp Source 10/31/18 1057 Oral     SpO2 10/31/18 1057 100 %     Weight --      Height --      Head Circumference --      Peak Flow --      Pain Score 10/31/18 1055 6     Pain Loc --       Pain Edu? --      Excl. in GC? --    No data found.  Updated Vital Signs BP (!) 162/91 (BP Location: Left Arm)   Pulse 74   Temp 98.1 F (36.7 C) (Oral)   Resp 18   SpO2 100%   Visual Acuity Right Eye Distance:   Left Eye Distance:   Bilateral Distance:    Right Eye Near:   Left Eye Near:    Bilateral Near:     Physical Exam Vitals signs and nursing note reviewed.  Constitutional:      General: She is not in acute distress.    Appearance: She is well-developed. She is not ill-appearing, toxic-appearing or diaphoretic.     Comments: Very pleasant   HENT:     Head: Normocephalic and atraumatic.     Mouth/Throat:     Mouth: Mucous membranes are moist.     Pharynx: Oropharynx is clear.  Eyes:     Extraocular Movements: Extraocular movements intact.     Conjunctiva/sclera: Conjunctivae normal.     Pupils: Pupils are equal, round, and reactive to light.  Neck:     Musculoskeletal: Neck supple.  Cardiovascular:     Rate and Rhythm: Normal rate and regular rhythm.     Heart sounds: No murmur.  Pulmonary:     Effort: Pulmonary effort is normal. No respiratory distress.     Breath sounds: Normal breath sounds.  Musculoskeletal: Normal range of motion.  Skin:    General: Skin is warm and dry.  Neurological:     Mental Status: She is alert.     Sensory: No sensory deficit.     Motor: No weakness.  Psychiatric:        Mood and Affect: Mood normal.        Speech: Speech normal.        Behavior: Behavior normal.      UC Treatments / Results  Labs (all labs ordered are listed, but only abnormal results are displayed) Labs Reviewed - No data to display  EKG None  Radiology No results found.  Procedures Procedures (including critical care time)  Medications Ordered in UC Medications  ketorolac (TORADOL) 30 MG/ML injection 30 mg (30 mg Intramuscular Given 10/31/18 1224)    Initial Impression / Assessment and Plan / UC Course  I have reviewed the triage  vital  signs and the nursing notes.  Pertinent labs & imaging results that were available during my care of the patient were reviewed by me and considered in my medical decision making (see chart for details).     Patient is a 53 year old female who presents today with generalized headache and chest pain. EKG with normal sinus rhythm and normal rate. Lungs clear No focal neuro deficits on exam. Nothing concerning on exam No concern for CVA or ACS. Vital signs stable and she is nontoxic or ill-appearing. Most likely her chest pain is related to musculoskeletal and tension headache. Treating with Toradol here in clinic Strict instructions and precautions that if her symptoms continue or worsen she will need to go to the ER for further evaluation Patient understand agrees. Final Clinical Impressions(s) / UC Diagnoses   Final diagnoses:  Chest pain, unspecified type  Acute non intractable tension-type headache     Discharge Instructions     Your EKG was normal. Lungs clear Nothing concerning here today Toradol given for headache. This should also help with the chest pain if it is muscle in nature.  If your symptoms do not improve or worsen you will need to go to the ER.     ED Prescriptions    None     Controlled Substance Prescriptions [place] Controlled Substance Registry consulted? Not Applicable   Janace Aris, NP 11/03/18 [phone]

## 2018-11-07 ENCOUNTER — Telehealth: Payer: Self-pay | Admitting: *Deleted

## 2018-11-07 NOTE — Telephone Encounter (Signed)
MR faxed to [facility]; [place] [phone]

## 2019-12-22 ENCOUNTER — Encounter (HOSPITAL_COMMUNITY): Payer: Self-pay | Admitting: *Deleted

## 2019-12-22 ENCOUNTER — Emergency Department (HOSPITAL_COMMUNITY)
Admission: EM | Admit: 2019-12-22 | Discharge: 2019-12-22 | Discharge status: Against Medical Advice | Payer: 59 | Attending: Emergency Medicine | Admitting: Emergency Medicine

## 2019-12-22 ENCOUNTER — Emergency Department (HOSPITAL_COMMUNITY): Payer: 59

## 2019-12-22 DIAGNOSIS — R079 Chest pain, unspecified: Secondary | ICD-10-CM | POA: Insufficient documentation

## 2019-12-22 DIAGNOSIS — R21 Rash and other nonspecific skin eruption: Secondary | ICD-10-CM | POA: Insufficient documentation

## 2019-12-22 LAB — CBC
HCT: 33.2 % — ABNORMAL LOW (ref 36.0–46.0)
Hemoglobin: 10.2 g/dL — ABNORMAL LOW (ref 12.0–15.0)
MCH: 22.4 pg — ABNORMAL LOW (ref 26.0–34.0)
MCHC: 30.7 g/dL (ref 30.0–36.0)
MCV: 73 fL — ABNORMAL LOW (ref 80.0–100.0)
Platelets: 172 10*3/uL (ref 150–400)
RBC: 4.55 MIL/uL (ref 3.87–5.11)
RDW: 17.2 % — ABNORMAL HIGH (ref 11.5–15.5)
WBC: 3.6 10*3/uL — ABNORMAL LOW (ref 4.0–10.5)
nRBC: 0 % (ref 0.0–0.2)

## 2019-12-22 LAB — BASIC METABOLIC PANEL
Anion gap: 11 (ref 5–15)
BUN: 16 mg/dL (ref 6–20)
CO2: 24 mmol/L (ref 22–32)
Calcium: 9.3 mg/dL (ref 8.9–10.3)
Chloride: 105 mmol/L (ref 98–111)
Creatinine, Ser: 0.97 mg/dL (ref 0.44–1.00)
GFR calc Af Amer: >60 mL/min (ref >60)
GFR calc non Af Amer: >60 mL/min (ref >60)
Glucose, Bld: 111 mg/dL — ABNORMAL HIGH (ref 70–99)
Potassium: 3.8 mmol/L (ref 3.5–5.1)
Sodium: 140 mmol/L (ref 135–145)

## 2019-12-22 LAB — TROPONIN I (HIGH SENSITIVITY): Troponin I (High Sensitivity): 3 ng/L (ref <18)

## 2019-12-22 MED ORDER — DIPHENHYDRAMINE HCL 25 MG PO CAPS
25.0000 mg | ORAL_CAPSULE | Freq: Once | ORAL | Status: AC
  Administered 2019-12-22: 25 mg via ORAL
  Filled 2019-12-22: qty 1

## 2019-12-22 MED ORDER — FAMOTIDINE 20 MG PO TABS
20.0000 mg | ORAL_TABLET | Freq: Once | ORAL | Status: AC
  Administered 2019-12-22: 20 mg via ORAL
  Filled 2019-12-22: qty 1

## 2019-12-22 MED ORDER — PREDNISONE 20 MG PO TABS
60.0000 mg | ORAL_TABLET | Freq: Once | ORAL | Status: AC
  Administered 2019-12-22: 60 mg via ORAL
  Filled 2019-12-22: qty 3

## 2019-12-22 MED ORDER — SODIUM CHLORIDE 0.9% FLUSH: 3.0000 mL | Freq: Once | INTRAVENOUS | Status: DC

## 2019-12-22 NOTE — ED Triage Notes (Signed)
To ED via [facility] for eval of cp radiating to mid back after taking a 50mg  Tramadol. EMS gave pt asa and nitro enroute and pain down to 4/10. Pt states she took tramadol for knee pain. Had never taken this strength. Felt no pain or discomfort in chest prior to tramadol.

## 2019-12-22 NOTE — ED Notes (Signed)
Per triage staff, pt left.

## 2019-12-22 NOTE — ED Provider Notes (Signed)
Patient placed in Quick Look pathway, seen and evaluated   I was asked by nursing staff to evaluate this patient in triage.   Chief Complaint: chest pain  HPI:   This morning pt took a pain pill (tramadol). She then developed chest pain, back pain, nausea, dizziness, and SOB. Also felt near syncopal. Also developed a rash to the RUE that developed while she was here in the ED.  ROS: chest pain, rash (one)  Physical Exam:   Gen: No distress  Neuro: Awake and Alert  Skin: Warm    Focused Exam: heart with RRR, lungs CTAB, no wheezing. Hive like rash located to the RUE.   Ordered CP w/u. Gave prednisone, pepcid, and benadryl for hive like rash.   Initiation of care has begun. The patient has been counseled on the process, plan, and necessity for staying for the completion/evaluation, and the remainder of the medical screening examination    Karrie Meres, PA-C 12/22/19 1610    Pricilla Loveless, MD 12/22/19 1044

## 2019-12-22 NOTE — ED Notes (Signed)
Patient requesting to see RN. Went and spoke with patient.  Pt states fell asleep and woke up with right upper arm itching.  Area is read to posterior upper arm with warmth and appears to have hive like appearance.  No respiratory issues.  There is a provider at triage who will assess area.

## 2020-02-25 ENCOUNTER — Other Ambulatory Visit: Payer: Self-pay

## 2020-02-25 ENCOUNTER — Ambulatory Visit (INDEPENDENT_AMBULATORY_CARE_PROVIDER_SITE_OTHER): Payer: 59

## 2020-02-25 ENCOUNTER — Ambulatory Visit (INDEPENDENT_AMBULATORY_CARE_PROVIDER_SITE_OTHER): Payer: 59 | Admitting: Podiatry

## 2020-02-25 ENCOUNTER — Encounter: Payer: Self-pay | Admitting: Podiatry

## 2020-02-25 DIAGNOSIS — S9032XA Contusion of left foot, initial encounter: Secondary | ICD-10-CM

## 2020-02-25 DIAGNOSIS — M2042 Other hammer toe(s) (acquired), left foot: Secondary | ICD-10-CM | POA: Diagnosis not present

## 2020-02-25 DIAGNOSIS — M2041 Other hammer toe(s) (acquired), right foot: Secondary | ICD-10-CM | POA: Diagnosis not present

## 2020-02-25 DIAGNOSIS — L989 Disorder of the skin and subcutaneous tissue, unspecified: Secondary | ICD-10-CM | POA: Diagnosis not present

## 2020-03-02 NOTE — Progress Notes (Signed)
Subjective:   Patient ID: Michelle Molina, female   DOB: 54 y.o.   MRN: [id number]   HPI 54 year old female presents the office today for concerns of a painful growth onset of her left foot. She states that she first noticed a couple weeks ago causing discomfort. She had no recent treatment denies any recent injury. She has noticed a prominence to the area.  She has secondary concerns and symmetry some burning to the toes. She states the toes move  himself to times. No rating pain or weakness. No recent treatment for this.   Review of Systems  All other systems reviewed and are negative.  Past Medical History:  Diagnosis Date  . Allergy   . Anemia   . PONV (postoperative nausea and vomiting)   . Ulcer     Past Surgical History:  Procedure Laterality Date  . CESAREAN SECTION  1997, 1999   x2  . CHOLECYSTECTOMY  1997  . GASTRIC BYPASS  2013  . HERNIA REPAIR    . HYSTEROSCOPY  2006  . KNEE SURGERY  2001   left  . MYOMECTOMY  2010  . SMALL INTESTINE SURGERY    . TUBAL LIGATION  2002     Current Outpatient Medications:  .  acetaminophen (TYLENOL) 500 MG tablet, Take 1,000 mg by mouth every 6 (six) hours as needed for mild pain., Disp: , Rfl:  .  Calcium 600-400 MG-UNIT CHEW, Chew 1 tablet by mouth 2 (two) times daily., Disp: , Rfl:  .  Cholecalciferol (VITAMIN D3) 1000 UNITS CAPS, Take 1 capsule by mouth daily. , Disp: , Rfl:  .  Cyanocobalamin (VITAMIN B 12 PO), Take 1 tablet by mouth daily., Disp: , Rfl:  .  ferrous sulfate 325 (65 FE) MG tablet, Take 1 tablet (325 mg total) by mouth 2 (two) times daily with a meal., Disp: 180 tablet, Rfl: 1 .  folic acid (FOLVITE) 1 MG tablet, Take 1 mg by mouth daily., Disp: , Rfl:  .  Multiple Vitamin (MULITIVITAMIN WITH MINERALS) TABS, Take 1 tablet by mouth 2 (two) times daily. , Disp: , Rfl:  .  PHENTERMINE RESIN COMPLEX PO, Take by mouth every morning., Disp: , Rfl:  .  thiamine (VITAMIN B-1) 100 MG tablet, Take 1 tablet (100 mg total)  by mouth daily., Disp: 90 tablet, Rfl: 1  Allergies  Allergen Reactions  . Ibuprofen Other (See Comments)    Had gastric bypass Had gastric bypass Had gastric bypass Due to gastric bypass surgery   . Other Nausea And Vomiting  . Aspirin Nausea Only and Other (See Comments)  . Oxycodone-Acetaminophen Itching and Other (See Comments)  . Percocet [Oxycodone-Acetaminophen] Itching         Objective:  Physical Exam  General: AAO x3, NAD  Dermatological: Hyperkeratotic lesion of the fifth metatarsal base of the left foot. Upon debridement there is no underlying ulceration drainage or signs of infection. No open lesions identified otherwise.  Vascular: Dorsalis Pedis artery and Posterior Tibial artery pedal pulses are 2/4 bilateral with immedate capillary fill time.  There is no pain with calf compression, swelling, warmth, erythema.   Neruologic: Grossly intact via light touch bilateral. Sensation intact with Phoebe Perch monofilament  Musculoskeletal: Hammertoes are present. There is no area of pinpoint tenderness noted but otherwise clear there is prominence of his metatarsal bases bilaterally. Muscular strength 5/5 in all groups tested bilateral.  Gait: Unassisted, Nonantalgic.       Assessment:   Hyperkeratotic lesion left foot;  hammertoes, neuritis    Plan:  -Treatment options discussed including all alternatives, risks, and complications -Etiology of symptoms were discussed -X-rays were obtained and reviewed with the patient. Hammertoes are present there is no evidence of acute fracture or foreign body identified. -Hyperkeratotic lesion sharp debrided any complications. Skin with alcohol and a pad was placed followed by salicylic acid and a bandage. Post procedure instructions discussed -Discussed hammertoes that it was causing some of the burning into the toes. Discussed possible neuropathy but it is more of a localized issue. Dispensed offloading pads for the  hammertoes discussed shoe modifications and orthotics.  Vivi Barrack DPM

## 2020-03-29 NOTE — Progress Notes (Addendum)
[facility]    Provider:  Dr Lucia GaskinsAhern Requesting Provider: Juliet RudeLivengood, Jessica J, P* Primary Care Provider:  Hillery AldoLivengood, Jessica J, PA-C  CC:  Toes moving  HPI:  Michelle Molina Wellons is a 54 y.o. female here as requested by Juliet RudeLivengood, Jessica J, P* for evaluation of toe weakness/nerve injury from surgery.  Past medical history migraine, sickle cell trait, constipation, neuropathy, vitamin Molina deficiency. Patient feels she has left toe weakness since surgery, her toe keeps curling up in the left foot, it happens all the time, her toes are moving "so much" and it wakes her up, it is not painful but it is annoying, the great toe stays raised, the second toes rub against the big ones, she is on her feet a lot, she gets leg cramps, she had knee surgery 20 years ago and her foot with "club over" like a spasm during recovery.  She has been to orthopedics and podiatry and states they have never seen this. No problems with the other foot. It started with knee surgery 20 years ago and when she was healing from her surgery she would curl her toes under and move a little but worse in the past year, it is continuous, it wakes her up. No sensory changes. No leg weakness. No problems with upper extremities or other focal symptoms.Aggravates when walking. Nothing makes it better. She cannot make it stop voluntarily unless she flexes her feet.   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  There is no documentation whatsoever in [employer] Pipeline[employer] Levengood's notes that I received about the chief complaint she is being requested for.  I was able to find a note from podiatry from Dr. Loreta AveWagner  March 02, 2020 where she was seen for a painful growth of her left foot, she noticed it several weeks prior causing discomfort, she also discussed some secondary concerns of burning to the toes, she states the toes "move himself to times", no pain or weakness, no recent treatment.  Neurologically she was grossly intact, hammertoes were present, there was no area of pinpoint tenderness noticed but there was prominence of the metatarsal bases bilaterally, muscle strength was 5 out of 5 according to Dr. Loreta AveWagner.  X-rays were obtained and showed no evidence of acute fracture or foreign body identified.  Hyperkeratotic lesion sharp debrided without complications.  We also discussed the hammertoes could be causing some burning into the toes, more likely hammertoes as neuropathy.  They discussed shoe modification and orthotics, he dispensed offloading pads for the hammertoes. I called and spoke with Ernst BowlerJessica Livengood and she states that patient has 2 hammer toes since she had surgery on her knee.     Review of Systems: Patient complains of symptoms per HPI as well as the following symptoms: cramps. Pertinent negatives and positives per HPI. All others negative.   Social History   Socioeconomic History  . Marital status: Divorced    Spouse name: Not on file  . Number of children: 2  . Years of education: 8216  . Highest education level: Not on file  Occupational History  . Occupation: Biochemist, clinicalDetention Officer     Comment[place] [employer]  Tobacco Use  . Smoking status: Never Smoker  . Smokeless tobacco: Never Used  Vaping Use  . Vaping Use: Never used  Substance and Sexual Activity  . Alcohol use: Yes    Comment: "rarely"  . Drug use: No  . Sexual activity: Yes    Birth control/protection: Condom  Other Topics Concern  . Not on file  Social History Narrative   Lives with her dog   Regular exercise-yes   Caffeine Use- 64 oz iced coffee at night while working 12 hour shift     Social Determinants of Health   Financial Resource Strain:   . Difficulty of Paying Living Expenses: Not on file  Food Insecurity:   . Worried About Programme researcher, broadcasting/film/video in the Last Year: Not on file  . Ran Out of Food in the Last Year: Not on file  Transportation Needs:   . Lack of Transportation (Medical): Not on file  . Lack of Transportation (Non-Medical): Not on file  Physical Activity:   . Days of Exercise per Week: Not on file  . Minutes of Exercise per Session: Not on file  Stress:   . Feeling of Stress : Not on file  Social Connections:   . Frequency of Communication with Friends and Family: Not on file  . Frequency of Social Gatherings with Friends and Family: Not on file  . Attends Religious Services: Not on file  . Active Member of Clubs or Organizations: Not on file  . Attends Banker Meetings: Not on file  . Marital Status: Not on file  Intimate Partner Violence:   . Fear of Current or Ex-Partner: Not on file  . Emotionally Abused: Not on file  . Physically Abused: Not on file  . Sexually Abused: Not on file    Family History  Problem Relation Age of Onset  . Uterine cancer Mother   . Hypertension Mother   . Hyperlipidemia Mother   . Other Mother        sarcoma  . Breast cancer Maternal Aunt        x3 maternal aunts  . Lung cancer Maternal Uncle   . Diabetes Maternal Grandfather   . Hyperlipidemia Maternal Grandfather   . Stroke Maternal Grandfather   . Breast cancer Cousin     Past Medical History:  Diagnosis Date  . Allergy   . Anemia   . Hiatal hernia   . Migraines   . PONV (postoperative nausea and vomiting)   . Ulcer     Patient Active Problem List   Diagnosis Date Noted  . Athetosis 03/30/2020  . 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 Routine general medical examination at a health care facility 09/10/2017  . Anemia, iron deficiency 06/17/2013  . Status post gastric bypass for obesity  06/16/2013  . Depressed 11/28/2011    Past Surgical History:  Procedure Laterality Date  . CESAREAN SECTION  1997, 1999   x2  . CHOLECYSTECTOMY  1997  . COLONOSCOPY    . GASTRIC BYPASS  2013  . HERNIA REPAIR     hiatal hernia  . HYSTEROSCOPY  2006  . KNEE SURGERY  2001   left  . MYOMECTOMY  2010  . SALPINGECTOMY Left   . SMALL INTESTINE SURGERY    . UPPER GI ENDOSCOPY     "several"    Current Outpatient Medications  Medication Sig Dispense Refill  . acetaminophen (TYLENOL) 500 MG tablet Take 1,000 mg by mouth  every 6 (six) hours as needed for mild pain.    Marland Kitchen Apple Cider Vinegar 500 MG TABS Take 6 tablets by mouth daily. Also takes the liquid.    Marland Kitchen b complex vitamins capsule Take by mouth.    . Biotin (BIOTIN MAXIMUM STRENGTH) 5 MG CAPS Take 1 capsule by mouth daily.    . Calcium 600-400 MG-UNIT CHEW Chew 2 tablets by mouth daily.     . Cholecalciferol (VITAMIN D3) 1000 UNITS CAPS Take 1 capsule by mouth daily.     . Cyanocobalamin (VITAMIN B 12) 250 MCG LOZG Take 2 lozenges by mouth daily.    . ferrous sulfate 325 (65 FE) MG tablet Take 1 tablet (325 mg total) by mouth 2 (two) times daily with a meal. 180 tablet 1  . Magnesium 250 MG TABS Take 1 tablet by mouth. Once daily with a meal    . Multiple Vitamin (MULITIVITAMIN WITH MINERALS) TABS Take 1 tablet by mouth 2 (two) times daily.     . Multiple Vitamins-Minerals (EMERGEN-C IMMUNE PO) Take by mouth.    . Omega-3 1000 MG CAPS Take 1 capsule by mouth daily.    . Potassium 99 MG TABS Take 1 tablet by mouth in the morning and at bedtime.    . SUMAtriptan (IMITREX) 100 MG tablet Take 100 mg by mouth once as needed for migraine. May repeat in 2 hours if headache persists or recurs.    . Turmeric Curcumin 500 MG CAPS Take 4 capsules by mouth daily.    . vitamin E (VITAMIN E) 180 MG (400 UNITS) capsule Take 400 Units by mouth daily.     No current facility-administered medications for this visit.    Allergies as of 03/30/2020  - Review Complete 03/30/2020  Allergen Reaction Noted  . Ibuprofen Other (See Comments) 11/10/2013  . Other Nausea And Vomiting 02/25/2020  . Aspirin Nausea Only and Other (See Comments) 08/24/2011  . Oxycodone-acetaminophen Itching and Other (See Comments) 08/24/2011  . Percocet [oxycodone-acetaminophen] Itching 08/24/2011  . Tramadol hcl Hives, Nausea Only, and Rash 03/30/2020    Vitals: BP (!) 152/85 (BP Location: Right Arm, Patient Position: Sitting)   Pulse 60   Ht 5\' 5"  (1.651 m)   Wt 184 lb (83.5 kg)   BMI 30.62 kg/m  Last Weight:  Wt Readings from Last 1 Encounters:  03/30/20 184 lb (83.5 kg)   Last Height:   Ht Readings from Last 1 Encounters:  03/30/20 5\' 5"  (1.651 m)     Physical exam: Exam: Gen: NAD, conversant, well nourised, well groomed                     CV: RRR, no MRG. No Carotid Bruits. No peripheral edema, warm, nontender Eyes: Conjunctivae clear without exudates or hemorrhage  Neuro: Detailed Neurologic Exam  Speech:    Speech is normal; fluent and spontaneous with normal comprehension.  Cognition:    The patient is oriented to person, place, and time;     recent and remote memory intact;     language fluent;     normal attention, concentration,     fund of knowledge Cranial Nerves:    The pupils are equal, round, and reactive to light. Attempted fundoscopy. Visual fields are full to finger confrontation. Extraocular movements are intact. Trigeminal sensation is intact and the muscles of mastication are normal. The face is symmetric. The palate elevates in the midline. Hearing intact. Voice is normal. Shoulder shrug is normal. The tongue  has normal motion without fasciculations.   Coordination:    Normal finger to nose and heel to shin. Normal rapid alternating movements.   Gait:    Heel-toe and tandem gait are normal.   Motor Observation:    Involuntary rhythmic continuous athetoid movements of the left toes with reported "bad cramps" which  may be dystonic events. I do think it looks entirely involuntary and is not distractible. Toe extension and digits 2-5 left foot flexion posture. Tone:    Normal muscle tone.    Posture:    Posture is normal. normal erect    Strength:    Strength is V/V in the upper and lower limbs.      Sensation: intact to LT     Reflex Exam:  DTR's:    Deep tendon reflexes in the upper and lower extremities are normal bilaterally.   Toes:    The toes are downgoing bilaterally.   Clonus:    Clonus is absent.    Assessment/Plan:  Involuntary rhythmic continuous athetoid movements of the left toes in a wave-like fashion with reported "bad cramps" which may be dystonic events. I do think it looks entirely involuntary and is not distractible. 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 Need Movement Disorder Specialist   Orders Placed This Encounter  Procedures  . MR BRAIN W WO CONTRAST  . Copper, serum  . Ceruloplasmin  . TSH  . ANA Comprehensive Panel  . CBC  . Comprehensive metabolic panel  . Ambulatory referral to Neurology    Cc: Juliet Rude*,  Hillery Aldo, PA-C  Naomie Dean, MD  [facility] [address] [place], [place] [address]  Phone [phone] Fax [phone]

## 2020-03-30 ENCOUNTER — Encounter: Payer: Self-pay | Admitting: *Deleted

## 2020-03-30 ENCOUNTER — Telehealth: Payer: Self-pay | Admitting: Neurology

## 2020-03-30 ENCOUNTER — Other Ambulatory Visit: Payer: Self-pay

## 2020-03-30 ENCOUNTER — Ambulatory Visit: Payer: 59 | Admitting: Neurology

## 2020-03-30 ENCOUNTER — Encounter: Payer: Self-pay | Admitting: Neurology

## 2020-03-30 VITALS — BP 152/85 | HR 60 | Ht 65.0 in | Wt 184.0 lb

## 2020-03-30 DIAGNOSIS — R258 Other abnormal involuntary movements: Secondary | ICD-10-CM | POA: Diagnosis not present

## 2020-03-30 DIAGNOSIS — R259 Unspecified abnormal involuntary movements: Secondary | ICD-10-CM | POA: Diagnosis not present

## 2020-03-30 DIAGNOSIS — G259 Extrapyramidal and movement disorder, unspecified: Secondary | ICD-10-CM | POA: Diagnosis not present

## 2020-03-30 DIAGNOSIS — G249 Dystonia, unspecified: Secondary | ICD-10-CM

## 2020-03-30 NOTE — Addendum Note (Signed)
Addended by: Naomie Dean B on: 03/30/2020 10:40 AM   Modules accepted: Orders

## 2020-03-30 NOTE — Telephone Encounter (Signed)
LVM for pt to call back about scheduling [facility] Auth: [id number] (exp. 03/30/20 to 05/14/20)

## 2020-03-30 NOTE — Patient Instructions (Signed)
MRI brain Blood work [facility] Team   Dystonia Dystonia is a condition that makes your muscles contract without warning (muscle spasms). It can make doing everyday tasks hard. There are different forms of dystonia. The condition can affect just one part of your body, or it can affect different parts of your body. Dystonia affects people in different ways. In some people, it is mild and goes away over time. In others, it is severe and may need treatment. Although there is no cure for dystonia, you can manage the condition with treatment. What are the causes? This condition may be present at birth. In this case, it is caused by:  Genetics. This means you inherited genes that lead to abnormal muscle contractions.  Abnormal basal ganglia function. This is a defect in the part of the brain that controls movement. The condition may also occur after birth (acquired). In this case, you develop the condition following:  Brain injury.  Infection.  Drug reaction. Sometimes the cause of dystonia is not known (idiopathic dystonia). What are the signs or symptoms? Symptoms of this condition depend on which type of dystonia you have. Common signs and symptoms include:  Muscle twitches or spasms around your eyes (blepharospasm).  Foot cramping or dragging.  Pulling of your neck to one side (torticollis).  Muscles spasms of the face.  Spasms of the voice box (larynx).  Tremors.  Muscle cramping after activity. How is this diagnosed? This condition may be diagnosed based on:  Symptoms and medical history.  Physical exam. You may also have other tests, including:  A blood test to check for genes that cause dystonia.  Brain imaging tests to rule out other causes of your symptoms. How is this treated? There are no treatments that can cure or prevent dystonia. Treatment to manage dystonia may include:  Taking medicines to relax muscles.  Using a heating pad, or receiving  massage or physical therapy.  Taking a medicine that is used to treat Parkinson's disease. This medicine improves symptoms of dystonia.  Injecting the affected muscles with a chemical (botulinum) that blocks muscle spasms. This treatment can block spasms for a few days to a few months.  Slowly weaning you from a specific medicine to see if your symptoms improve. This may be done if that medicine is thought to be causing dystonia.  Having surgery to implant an electrical device (deep brain simulator) to help override abnormal signals being sent to your muscles. This is done in severe cases. Follow these instructions at home:   Do physical therapy exercises at home as instructed by your physical therapist.  Make sure that you have a good support system. Let your health care provider know if you are struggling with stress or anxiety.  Do not drink alcohol.  Do not use any products that contain nicotine or tobacco, such as cigarettes and e-cigarettes. If you need help quitting, ask your health care provider.  Do not drive or operate heavy machinery until your health care provider approves.  Take over-the-counter and prescription medicines only as told by your health care provider.  Keep all follow-up visits as told by your health care provider. This is important. Contact a health care provider if:  Your condition is changing or getting worse.  You need more support taking care of yourself or handling household duties. Your health care provider may be able to arrange nursing assistance in your home. Summary  Dystonia is a condition that makes your muscles contract without warning (  muscle spasms).  There are no treatments that can cure or prevent dystonia. Medicines may be prescribed to manage symptoms.  Physical therapy may also be recommended to help maintain muscle strength and improve your balance. This information is not intended to replace advice given to you by your health care  provider. Make sure you discuss any questions you have with your health care provider. Document Revised: 04/30/2018 Document Reviewed: 07/16/2017 Elsevier Patient Education  2020 [employer].

## 2020-03-30 NOTE — Progress Notes (Signed)
Referral notes from [facility].

## 2020-03-31 ENCOUNTER — Other Ambulatory Visit: Payer: Self-pay | Admitting: Neurology

## 2020-03-31 MED ORDER — ALPRAZOLAM 0.25 MG PO TABS: ORAL_TABLET | ORAL | 0 refills | Status: DC

## 2020-03-31 NOTE — Telephone Encounter (Signed)
Noted, thank you

## 2020-03-31 NOTE — Telephone Encounter (Signed)
Done and sent her email with side effects thanks

## 2020-03-31 NOTE — Telephone Encounter (Signed)
Patient returned my call she is scheduled at [facility] for 04/06/20. She informed me she is claustrophic and would need something to help her. She is aware to have a driver.

## 2020-04-03 LAB — COMPREHENSIVE METABOLIC PANEL
ALT: 22 IU/L (ref 0–32)
AST: 39 IU/L (ref 0–40)
Albumin/Globulin Ratio: 1.5 (ref 1.2–2.2)
Albumin: 4.2 g/dL (ref 3.8–4.9)
Alkaline Phosphatase: 160 IU/L — ABNORMAL HIGH (ref 44–121)
BUN/Creatinine Ratio: 14 (ref 9–23)
BUN: 12 mg/dL (ref 6–24)
Bilirubin Total: 0.7 mg/dL (ref 0.0–1.2)
CO2: 22 mmol/L (ref 20–29)
Calcium: 9.1 mg/dL (ref 8.7–10.2)
Chloride: 102 mmol/L (ref 96–106)
Creatinine, Ser: 0.86 mg/dL (ref 0.57–1.00)
GFR calc Af Amer: 89 mL/min/{1.73_m2} (ref >59)
GFR calc non Af Amer: 77 mL/min/{1.73_m2} (ref >59)
Globulin, Total: 2.8 g/dL (ref 1.5–4.5)
Glucose: 78 mg/dL (ref 65–99)
Potassium: 4.2 mmol/L (ref 3.5–5.2)
Sodium: 140 mmol/L (ref 134–144)
Total Protein: 7 g/dL (ref 6.0–8.5)

## 2020-04-03 LAB — CBC
Hematocrit: 33.9 % — ABNORMAL LOW (ref 34.0–46.6)
Hemoglobin: 10.6 g/dL — ABNORMAL LOW (ref 11.1–15.9)
MCH: 22.5 pg — ABNORMAL LOW (ref 26.6–33.0)
MCHC: 31.3 g/dL — ABNORMAL LOW (ref 31.5–35.7)
MCV: 72 fL — ABNORMAL LOW (ref 79–97)
Platelets: 239 10*3/uL (ref 150–450)
RBC: 4.72 x10E6/uL (ref 3.77–5.28)
RDW: 17.6 % — ABNORMAL HIGH (ref 11.7–15.4)
WBC: 3.9 10*3/uL (ref 3.4–10.8)

## 2020-04-03 LAB — ANA COMPREHENSIVE PANEL
Anti JO-1: <0.2 AI (ref 0.0–0.9)
Centromere Ab Screen: <0.2 AI (ref 0.0–0.9)
Chromatin Ab SerPl-aCnc: <0.2 AI (ref 0.0–0.9)
ENA RNP Ab: <0.2 AI (ref 0.0–0.9)
ENA SM Ab Ser-aCnc: <0.2 AI (ref 0.0–0.9)
ENA SSA (RO) Ab: <0.2 AI (ref 0.0–0.9)
ENA SSB (LA) Ab: <0.2 AI (ref 0.0–0.9)
Scleroderma (Scl-70) (ENA) Antibody, IgG: <0.2 AI (ref 0.0–0.9)
dsDNA Ab: <1 IU/mL (ref 0–9)

## 2020-04-03 LAB — CERULOPLASMIN: Ceruloplasmin: 36.8 mg/dL (ref 19.0–39.0)

## 2020-04-03 LAB — TSH: TSH: 1.97 u[IU]/mL (ref 0.450–4.500)

## 2020-04-03 LAB — COPPER, SERUM: Copper: 162 ug/dL — ABNORMAL HIGH (ref 80–158)

## 2020-04-06 ENCOUNTER — Ambulatory Visit (INDEPENDENT_AMBULATORY_CARE_PROVIDER_SITE_OTHER): Payer: 59

## 2020-04-06 ENCOUNTER — Telehealth: Payer: Self-pay | Admitting: Neurology

## 2020-04-06 ENCOUNTER — Encounter: Payer: Self-pay | Admitting: Neurology

## 2020-04-06 ENCOUNTER — Other Ambulatory Visit: Payer: Self-pay | Admitting: Neurology

## 2020-04-06 DIAGNOSIS — R259 Unspecified abnormal involuntary movements: Secondary | ICD-10-CM

## 2020-04-06 DIAGNOSIS — G249 Dystonia, unspecified: Secondary | ICD-10-CM

## 2020-04-06 DIAGNOSIS — R258 Other abnormal involuntary movements: Secondary | ICD-10-CM | POA: Diagnosis not present

## 2020-04-06 DIAGNOSIS — G259 Extrapyramidal and movement disorder, unspecified: Secondary | ICD-10-CM | POA: Diagnosis not present

## 2020-04-06 NOTE — Telephone Encounter (Signed)
Thanks, that is fine. If I see anything abnormal I will ask her back, no need to call her I'm sure she was just letting us know

## 2020-04-06 NOTE — Telephone Encounter (Signed)
Noted thanks °

## 2020-04-06 NOTE — Telephone Encounter (Signed)
PT had a MRI today, when checking out she said that she was supposed to have with and without contrast but they did not do one with contrast.

## 2020-04-14 ENCOUNTER — Ambulatory Visit: Payer: 59 | Admitting: Podiatry

## 2020-04-20 ENCOUNTER — Ambulatory Visit
Admission: EM | Admit: 2020-04-20 | Discharge: 2020-04-20 | Disposition: A | Discharge status: Home / Self Care | Payer: 59

## 2020-04-20 ENCOUNTER — Other Ambulatory Visit: Payer: Self-pay

## 2020-04-20 DIAGNOSIS — R2231 Localized swelling, mass and lump, right upper limb: Secondary | ICD-10-CM | POA: Diagnosis not present

## 2020-04-20 NOTE — ED Triage Notes (Signed)
Pt states she has had a mass developing under her skin on her right upper arm x 3 days. Pt states it is not painful as much as just she feels it there. Pt is aox4 and ambulatory.

## 2020-04-20 NOTE — Discharge Instructions (Addendum)
Tylenol, ice for any pain or swelling.

## 2020-04-20 NOTE — ED Provider Notes (Signed)
[name]    CSN: [id number] Arrival date & time: 04/20/20  0830      History   Chief Complaint Chief Complaint  Patient presents with  . Arm Pain    right arm x 3 days    HPI Michelle Molina is a 54 y.o. female  Mass on right upper arm.  Noticed it about 3 days ago: Start small, that became bigger.  Denies pain, though concerned given growth.  Otherwise feels well: No fever, arthralgias, myalgias, recent bug bite, chest pain, difficulty breathing, change in appetite or activity level.  No trauma to area.  Has not training for.  Past Medical History:  Diagnosis Date  . Allergy   . Anemia   . Hiatal hernia   . Migraines   . PONV (postoperative nausea and vomiting)   . Ulcer     Patient Active Problem List   Diagnosis Date Noted  . Athetosis 03/30/2020  . 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 Routine general medical examination at a health care facility 09/10/2017  . Anemia, iron deficiency 06/17/2013  . Status post gastric bypass for obesity 06/16/2013  . Depressed 11/28/2011    Past Surgical History:  Procedure Laterality Date  . CESAREAN SECTION  1997, 1999   x2  . CHOLECYSTECTOMY  1997  . COLONOSCOPY    . GASTRIC BYPASS  2013  . HERNIA REPAIR     hiatal hernia  . HYSTEROSCOPY  2006  . KNEE SURGERY  2001   left  . MYOMECTOMY  2010  . SALPINGECTOMY Left   . SMALL INTESTINE SURGERY    . UPPER GI ENDOSCOPY     "several"    OB History   No obstetric history on file.      Home Medications    Prior to Admission medications   Medication Sig Start Date End Date Taking? Authorizing Provider  acetaminophen (TYLENOL) 500 MG tablet Take 1,000 mg by mouth every 6 (six) hours as needed for mild pain.   Yes [provider]  Apple Cider Vinegar 500 MG TABS Take 6 tablets by mouth daily. Also takes the liquid.   Yes [provider]  b complex vitamins capsule Take by mouth.   Yes [provider]  Biotin (BIOTIN MAXIMUM STRENGTH) 5 MG CAPS Take 1 capsule by mouth daily.   Yes [provider]  Cholecalciferol (VITAMIN D3) 1000 UNITS CAPS Take 1 capsule by mouth daily.    Yes [provider]  Cyanocobalamin (VITAMIN B 12) 250 MCG LOZG Take 2 lozenges by mouth daily.   Yes [provider]  ferrous sulfate 325 (65 FE) MG tablet Take 1 tablet (325 mg total) by mouth 2 (two) times daily with a meal. 09/16/17  Yes Etta Grandchild, MD  Multiple Vitamin (MULITIVITAMIN WITH MINERALS) TABS Take 1 tablet by mouth 2 (two) times daily.    Yes [provider]  Multiple Vitamins-Minerals (EMERGEN-C IMMUNE PO) Take by mouth.   Yes [provider]  Omega-3 1000 MG CAPS Take 1 capsule by mouth daily.   Yes [provider]  vitamin E (VITAMIN E) 180 MG (400 UNITS) capsule Take 400 Units by mouth daily.   Yes [provider]  Calcium 600-400 MG-UNIT CHEW Chew 2 tablets by mouth daily.     [provider]  Magnesium 250 MG TABS Take 1 tablet by mouth. Once daily with a meal    [provider]  SUMAtriptan (IMITREX) 100 MG tablet Take 100 mg by mouth once as needed for migraine. May repeat in 2 hours if headache persists or recurs.    [provider]  Turmeric Curcumin 500 MG CAPS Take 4 capsules by mouth daily.    [provider]  Potassium 99 MG TABS Take 1 tablet by mouth in the morning and at bedtime.  04/20/20  [provider]    Family History Family History  Problem Relation Age of Onset  . Uterine cancer Mother   . Hypertension Mother   . Hyperlipidemia Mother   . Other Mother        sarcoma  . Breast cancer Maternal Aunt        x3 maternal aunts  . Lung cancer Maternal Uncle   . Diabetes Maternal Grandfather   . Hyperlipidemia Maternal Grandfather   . Stroke Maternal Grandfather   . Breast cancer Cousin     Social History Social History   Tobacco Use  . Smoking status: Never  Smoker  . Smokeless tobacco: Never Used  Vaping Use  . Vaping Use: Never used  Substance Use Topics  . Alcohol use: Yes    Comment: "rarely"  . Drug use: No     Allergies   Ibuprofen, Other, Aspirin, Oxycodone-acetaminophen, Percocet [oxycodone-acetaminophen], and Tramadol hcl   Review of Systems Review of Systems  Constitutional: Negative for activity change, appetite change, chills, fatigue and fever.  Respiratory: Negative for choking and wheezing.   Cardiovascular: Negative for chest pain and palpitations.  Gastrointestinal: Negative for diarrhea and vomiting.  Musculoskeletal: Negative for arthralgias, joint swelling, myalgias, neck pain and neck stiffness.  All other systems reviewed and are negative.    Physical Exam Triage Vital Signs ED Triage Vitals  Enc Vitals Group     BP      Pulse      Resp      Temp      Temp src      SpO2      Weight      Height      Head Circumference      Peak Flow      Pain Score      Pain Loc      Pain Edu?      Excl. in GC?    No data found.  Updated Vital Signs BP (!) 152/88 (BP Location: Left Arm)   Pulse 82   Temp 98 F (36.7 C) (Oral)   Resp 20   SpO2 99%   Visual Acuity Right Eye Distance:   Left Eye Distance:   Bilateral Distance:    Right Eye Near:   Left Eye Near:    Bilateral Near:     Physical Exam Constitutional:      General: She is not in acute distress. HENT:     Head: Normocephalic and atraumatic.  Eyes:     General: No scleral icterus.    Pupils: Pupils are equal, round, and reactive to light.  Cardiovascular:     Rate and Rhythm: Normal rate.  Pulmonary:     Effort: Pulmonary effort is normal.  Musculoskeletal:        General: No tenderness. Normal range of motion.     Comments: 1.5-2 cm subcutaneous mass that is rubbery, mobile.  Nontender.  Skin:    Coloration: Skin is not jaundiced or pale.  Neurological:     Mental Status: She is alert and oriented to person, place, and time.  UC Treatments / Results  Labs (all labs ordered are listed, but only abnormal results are displayed) Labs Reviewed - No data to display  EKG   Radiology No results found.  Procedures Procedures (including critical care time)  Medications Ordered in UC Medications - No data to display  Initial Impression / Assessment and Plan / UC Course  I have reviewed the triage vital signs and the nursing notes.  Pertinent labs & imaging results that were available during my care of the patient were reviewed by me and considered in my medical decision making (see chart for details).     Lipoma VS lymphadenopathy.  No systemic symptoms at this time; more likely lipoma.  Will follow up with PCP, surgery as needed for further evaluation.  Return precautions discussed, pt verbalized understanding and is agreeable to plan. Final Clinical Impressions(s) / UC Diagnoses   Final diagnoses:  Arm mass, right     Discharge Instructions     Tylenol, ice for any pain or swelling.    ED Prescriptions    None     PDMP not reviewed this encounter.   Odette Fraction [place], [place] 04/20/20 [id number]

## 2020-09-05 ENCOUNTER — Other Ambulatory Visit: Payer: Self-pay

## 2020-09-05 ENCOUNTER — Ambulatory Visit: Payer: 59 | Admitting: Podiatry

## 2020-09-05 ENCOUNTER — Ambulatory Visit (INDEPENDENT_AMBULATORY_CARE_PROVIDER_SITE_OTHER): Payer: 59

## 2020-09-05 DIAGNOSIS — S9000XA Contusion of unspecified ankle, initial encounter: Secondary | ICD-10-CM

## 2020-09-05 DIAGNOSIS — M7662 Achilles tendinitis, left leg: Secondary | ICD-10-CM

## 2020-09-05 MED ORDER — METHYLPREDNISOLONE 4 MG PO TBPK: ORAL_TABLET | ORAL | 0 refills | Status: DC

## 2020-09-22 DIAGNOSIS — M7662 Achilles tendinitis, left leg: Secondary | ICD-10-CM | POA: Diagnosis not present

## 2020-09-22 MED ORDER — BETAMETHASONE SOD PHOS & ACET 6 (3-3) MG/ML IJ SUSP
3.0000 mg | Freq: Once | INTRAMUSCULAR | Status: AC
  Administered 2020-09-22: 3 mg via INTRA_ARTICULAR

## 2020-09-22 NOTE — Progress Notes (Signed)
   HPI: 55 y.o. female presenting today as an established patient for new complaint regarding pain and tenderness to the right posterior ankle.  Patient states the pain has been present for approximately 3-4 weeks.  She states that she wore a new pair of sneakers which caused burning and sharp pain when applying pressure to the posterior heel.  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  She has not done anything for treatment.  She presents for further treatment and evaluation    Past Medical History:  Diagnosis Date  . Allergy   . Anemia   . Hiatal hernia   . Migraines   . PONV (postoperative nausea and vomiting)   . Ulcer       Physical Exam: General: The patient is alert and oriented x3 in no acute distress.  Dermatology: Skin is warm, dry and supple bilateral lower extremities. Negative for open lesions or macerations.  Vascular: Palpable pedal pulses bilaterally. No edema or erythema noted. Capillary refill within normal limits.  Neurological: Epicritic and protective threshold grossly intact bilaterally.   Musculoskeletal Exam: Pain on palpation noted to the posterior tubercle of the right calcaneus at the insertion of the Achilles tendon consistent with retrocalcaneal bursitis. Range of motion within normal limits. Muscle strength 5/5 in all muscle groups bilateral lower extremities.  Radiographic Exam:  Posterior calcaneal spur noted to the respective calcaneus on lateral view. No fracture or dislocation noted. Normal osseous mineralization noted.     Assessment: 1. Insertional Achilles tendinitis right 2. Retrocalcaneal bursitis   Plan of Care:  1. Patient was evaluated. Radiographs were reviewed today. 2. Injection of 0.5 mL Celestone Soluspan injected into the retrocalcaneal bursa. Care was taken to avoid direct injection into the Achilles tendon. 3.  Prescription for Medrol Dosepak 3.  No NSAIDs prescribed.  Patient has history of gastric bypass 4.   Return to clinic in 4 weeks  *Works as a Land   [name], DPM [facility]  Dr. [name], DPM    [address]                                        [place], [place] [address]                Office [phone]  Fax [phone]

## 2020-10-10 ENCOUNTER — Ambulatory Visit: Payer: 59 | Admitting: Podiatry

## 2020-10-20 ENCOUNTER — Ambulatory Visit: Payer: Self-pay

## 2020-10-20 ENCOUNTER — Other Ambulatory Visit: Payer: Self-pay

## 2020-10-20 ENCOUNTER — Other Ambulatory Visit: Payer: Self-pay | Admitting: Family Medicine

## 2020-10-20 DIAGNOSIS — M25531 Pain in right wrist: Secondary | ICD-10-CM

## 2020-10-31 ENCOUNTER — Encounter: Payer: Self-pay | Admitting: Podiatry

## 2020-10-31 ENCOUNTER — Other Ambulatory Visit: Payer: Self-pay

## 2020-10-31 ENCOUNTER — Ambulatory Visit: Payer: 59 | Admitting: Podiatry

## 2020-10-31 DIAGNOSIS — M7662 Achilles tendinitis, left leg: Secondary | ICD-10-CM | POA: Diagnosis not present

## 2020-10-31 DIAGNOSIS — G5792 Unspecified mononeuropathy of left lower limb: Secondary | ICD-10-CM | POA: Diagnosis not present

## 2020-11-11 ENCOUNTER — Telehealth: Payer: Self-pay

## 2020-11-11 NOTE — Telephone Encounter (Signed)
Pt called today, stating that she is waiting on a referral to be placed for neurologist. Please advise

## 2020-11-14 NOTE — Progress Notes (Addendum)
   HPI: 55 y.o. female presenting today for follow-up evaluation of left lower extremity pain.  Patient states that the injection did not help.  She also did not take the prednisone pack.  She continues to have pain and sensitivity to the left lower extremity.  She did mention today that she has possible nerve damage secondary to knee surgery which was performed in 1999 to the left lower extremity.  This is the moment that she began to have pain and sensitivity associated to the rest of the leg and foot.  Past Medical History:  Diagnosis Date  . Allergy   . Anemia   . Hiatal hernia   . Migraines   . PONV (postoperative nausea and vomiting)   . Ulcer      Physical Exam: General: The patient is alert and oriented x3 in no acute distress.  Dermatology: Skin is warm, dry and supple bilateral lower extremities. Negative for open lesions or macerations.  Vascular: Palpable pedal pulses bilaterally. No edema or erythema noted. Capillary refill within normal limits.  Neurological: Epicritic and protective threshold grossly intact bilaterally.   Musculoskeletal Exam: Range of motion within normal limits to all pedal and ankle joints bilateral. Muscle strength 5/5 in all groups bilateral.  There continues to be pain on palpation to the posterior aspect of the leg with paresthesia  Assessment: 1.  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 Patient evaluated. 2.  Patient had seen a neurologist at [facility] and did not follow-up.  She is looking for neurology recommendations 3.  Referral to neurology placed 4.  Cam boot dispensed today.  Weightbearing as tolerated 5.  Recommend that the patient take the Medrol Dosepak that was prescribed.  She thought the Medrol Dosepak was tramadol which she is allergic to however the prescription was methylprednisolone 6.  Return to clinic in 4 weeks  *Works as a Land      [name], DPM [facility]  Dr. [name], DPM    [address] [place], [place] [address]                Office [phone]  Fax [phone]

## 2020-11-16 ENCOUNTER — Other Ambulatory Visit: Payer: Self-pay

## 2020-11-16 ENCOUNTER — Telehealth: Payer: Self-pay | Admitting: Podiatry

## 2020-11-16 DIAGNOSIS — M7662 Achilles tendinitis, left leg: Secondary | ICD-10-CM

## 2020-11-16 DIAGNOSIS — G5792 Unspecified mononeuropathy of left lower limb: Secondary | ICD-10-CM

## 2020-11-16 NOTE — Telephone Encounter (Signed)
Patient called our office stating she was still waiting on a referral to be placed for neurologist.    Please advise

## 2020-11-24 ENCOUNTER — Telehealth: Payer: Self-pay | Admitting: Podiatry

## 2020-11-24 NOTE — Telephone Encounter (Signed)
Someone from [facility] called stating the referral was sent to the neurosurgeon and not the neurologist. She also states she thinks this patient should be receiving an MRI because of how the patient described her pain. Could you possibly re fax the order? Please advise.

## 2020-11-25 ENCOUNTER — Other Ambulatory Visit: Payer: Self-pay

## 2020-11-25 DIAGNOSIS — G5792 Unspecified mononeuropathy of left lower limb: Secondary | ICD-10-CM

## 2020-11-25 NOTE — Telephone Encounter (Signed)
Pt called and lvm on the general mailbox, she wants to know who gave sent the referral to the wrong specialist office. Referral was sent to [facility] and they called her yesterday. She was suppose to be referred to a Dr. Allena Katz who is not a Retail buyer. She has been waiting for almost a month and she called twice and told that the referral was done. She wants to know when she is getting the referral and why was she referred to the wrong office. She wants to know why she has not been contacted

## 2020-11-28 ENCOUNTER — Encounter: Payer: Self-pay | Admitting: Neurology

## 2020-12-05 ENCOUNTER — Encounter: Payer: Self-pay | Admitting: Podiatry

## 2020-12-05 ENCOUNTER — Ambulatory Visit: Payer: 59 | Admitting: Podiatry

## 2020-12-05 ENCOUNTER — Other Ambulatory Visit: Payer: Self-pay

## 2020-12-05 DIAGNOSIS — M7661 Achilles tendinitis, right leg: Secondary | ICD-10-CM

## 2020-12-05 DIAGNOSIS — M7662 Achilles tendinitis, left leg: Secondary | ICD-10-CM

## 2020-12-05 DIAGNOSIS — G5792 Unspecified mononeuropathy of left lower limb: Secondary | ICD-10-CM

## 2020-12-05 MED ORDER — GABAPENTIN 100 MG PO CAPS: 100.0000 mg | ORAL_CAPSULE | Freq: Three times a day (TID) | ORAL | 3 refills | Status: DC

## 2020-12-05 NOTE — Progress Notes (Signed)
   HPI: 55 y.o. female presenting today for follow-up evaluation of bilateral lower extremity pain.  She states that nothing is helping to alleviate her pain or symptoms.  She has been wearing the boot with no relief.  Patient has possible nerve damage secondary to knee surgery which was performed in 1999 to the left lower extremity.  This is the moment that she began to have pain and sensitivity associated to the rest of the leg and foot.  Past Medical History:  Diagnosis Date   Allergy    Anemia    Hiatal hernia    Migraines    PONV (postoperative nausea and vomiting)    Ulcer      Physical Exam: General: The patient is alert and oriented x3 in no acute distress.  Dermatology: Skin is warm, dry and supple bilateral lower extremities. Negative for open lesions or macerations.  Vascular: Palpable pedal pulses bilaterally. No edema or erythema noted. Capillary refill within normal limits.  Neurological: Epicritic and protective threshold grossly intact bilaterally.   Musculoskeletal Exam: Range of motion within normal limits to all pedal and ankle joints bilateral. Muscle strength 5/5 in all groups bilateral.  There continues to be pain on palpation to the posterior aspect of the leg with paresthesia  Assessment: 1.  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  Achilles tendinitis right   Plan of Care:  1. Patient evaluated. 2.  Patient had seen a neurologist at [facility] and did not follow-up.  Patient has an appointment with Dr. Allena Katz, neurology, on 12/23/2020 3.  At this point the patient has failed multiple conservative modalities including steroid injections, anti-inflammatory medication, immobilization in the cam boot, and physical therapy. 4.  Recommend good supportive shoes and continue daily stretching exercises 5.  Note for work was provided to continue light duty only 6.  Prescription for gabapentin 100 mg 3 times daily to  see if this helps alleviate some neurological pain  7.  Return to clinic as needed, if the patient does not improve we may need to consider MRI bilateral ankles  *Works as a Land      Michelle Molina, DPM [facility]  Dr. Felecia Molina, DPM    [address] [place], [place] [address]                Office [phone]  Fax [phone]

## 2020-12-23 ENCOUNTER — Other Ambulatory Visit: Payer: Self-pay

## 2020-12-23 ENCOUNTER — Ambulatory Visit: Payer: 59 | Admitting: Neurology

## 2020-12-23 ENCOUNTER — Encounter: Payer: Self-pay | Admitting: Neurology

## 2020-12-23 VITALS — BP 148/87 | HR 68 | Ht 65.0 in | Wt 175.4 lb

## 2020-12-23 DIAGNOSIS — G249 Dystonia, unspecified: Secondary | ICD-10-CM

## 2020-12-23 DIAGNOSIS — G8929 Other chronic pain: Secondary | ICD-10-CM

## 2020-12-23 DIAGNOSIS — M25571 Pain in right ankle and joints of right foot: Secondary | ICD-10-CM | POA: Diagnosis not present

## 2020-12-23 DIAGNOSIS — R258 Other abnormal involuntary movements: Secondary | ICD-10-CM

## 2020-12-23 NOTE — Patient Instructions (Signed)
Please contact my office, if you would like to proceed with nerve testing.

## 2020-12-23 NOTE — Progress Notes (Signed)
[facility] Clinic Note - Initial Visit   Date: 12/23/20  Michelle Molina MRN: [id number] DOB: Apr 01, 1966   Dear Dr. Logan Bores:  Thank you for your kind referral of Michelle Molina for consultation of left leg pain. Although her history is well known to you, please allow [place] to reiterate it for the purpose of our medical record. The patient was accompanied to the clinic by self.    History of Present Illness: Michelle Molina is a 55 y.o. left-handed female referred for evaluation of left leg pain.  Upon further questioning, patient reports that she has right ankle pain.  She does not have pain in the left leg or foot.  Starting in 2020, she complains of sharp right ankle and heel pain.  Symptoms progressively intensified earlier this year.  Walking exacerbates her pain.  She has relief with leg elevation or massage.  She has been administered steroid injection, NSAIDs, and immobilization for tendonitis without any relief in her right ankle pain.  Her primary issue is ongoing pain in the right ankle.   Regarding her left foot, she has involuntary spasms of the toes which has been present since 1999 after her recovery for knee surgery.  Her toes will flex continuously.  She does not have any pain.  It often wakes her up from sleeping.  She was evaluated for this by movement disorder specialist at [facility] in 2021, Dr. Chales Abrahams Thenganatt.  She did not follow-up.  She denies numbness, tingling or pain. She has also seen Dr. Lucia Gaskins at [facility] in the past.  She works as a Biochemist, clinical.   Out-side paper records, electronic medical record, and images have been reviewed where available and summarized as:  Lab Results  Component Value Date   HGBA1C 5.3 12/08/2012   Lab Results  Component Value Date   VITAMINB12 398 09/10/2017   Lab Results  Component Value Date   TSH 1.970 03/30/2020   No results found for: ESRSEDRATE, POCTSEDRATE  Past Medical History:  Diagnosis Date    Allergy    Anemia    Hiatal hernia    Migraines    PONV (postoperative nausea and vomiting)    Ulcer     Past Surgical History:  Procedure Laterality Date   CESAREAN SECTION  1997, 1999   x2   CHOLECYSTECTOMY  1997   COLONOSCOPY     GASTRIC BYPASS  2013   HERNIA REPAIR     hiatal hernia   HYSTEROSCOPY  2006   KNEE SURGERY  2001   left   MYOMECTOMY  2010   SALPINGECTOMY Left    SMALL INTESTINE SURGERY     UPPER GI ENDOSCOPY     "several"     Medications:  Outpatient Encounter Medications as of 12/23/2020  Medication Sig Note   acetaminophen (TYLENOL) 500 MG tablet Take 1,000 mg by mouth every 6 (six) hours as needed for mild pain.    Calcium 600-400 MG-UNIT CHEW Chew 2 tablets by mouth daily.     GARLIC PO Take by mouth.    Omega-3 1000 MG CAPS Take 1 capsule by mouth daily.    phentermine (ADIPEX-P) 37.5 MG tablet Take 1/2 tablet by mouth in am before breakfast    [DISCONTINUED] Apple Cider Vinegar 500 MG TABS Take 6 tablets by mouth daily. Also takes the liquid.    [DISCONTINUED] b complex vitamins capsule Take by mouth.    [DISCONTINUED] Biotin (BIOTIN MAXIMUM STRENGTH) 5 MG CAPS Take  1 capsule by mouth daily.    [DISCONTINUED] Cholecalciferol (VITAMIN D3) 1000 UNITS CAPS Take 1 capsule by mouth daily.  09/01/2014: Received from: [facility]   [DISCONTINUED] Cyanocobalamin (VITAMIN B 12) 250 MCG LOZG Take 2 lozenges by mouth daily.    [DISCONTINUED] ferrous sulfate 325 (65 FE) MG tablet Take 1 tablet (325 mg total) by mouth 2 (two) times daily with a meal.    [DISCONTINUED] gabapentin (NEURONTIN) 100 MG capsule Take 1 capsule (100 mg total) by mouth 3 (three) times daily.    [DISCONTINUED] Magnesium 250 MG TABS Take 1 tablet by mouth. Once daily with a meal    [DISCONTINUED] methylPREDNISolone (MEDROL DOSEPAK) 4 MG TBPK tablet 6 day dose pack - take as directed    [DISCONTINUED] Multiple Vitamin (MULITIVITAMIN WITH MINERALS) TABS Take 1 tablet by mouth 2 (two) times  daily.     [DISCONTINUED] Multiple Vitamins-Minerals (EMERGEN-C IMMUNE PO) Take by mouth.    [DISCONTINUED] Potassium 99 MG TABS Take 1 tablet by mouth in the morning and at bedtime.    [DISCONTINUED] SUMAtriptan (IMITREX) 100 MG tablet Take 100 mg by mouth once as needed for migraine. May repeat in 2 hours if headache persists or recurs.    [DISCONTINUED] Turmeric Curcumin 500 MG CAPS Take 4 capsules by mouth daily.    [DISCONTINUED] vitamin E (VITAMIN E) 180 MG (400 UNITS) capsule Take 400 Units by mouth daily.    No facility-administered encounter medications on file as of 12/23/2020.    Allergies:  Allergies  Allergen Reactions   Ibuprofen Other (See Comments)    Had gastric bypass Due to gastric bypass surgery  ulcer    Other Nausea And Vomiting   Aspirin Nausea Only and Other (See Comments)   Oxycodone-Acetaminophen Itching and Other (See Comments)   Percocet [Oxycodone-Acetaminophen] Itching   Tramadol Hcl Hives, Nausea Only and Rash    Dizziness, passed out    Family History: Family History  Problem Relation Age of Onset   Uterine cancer Mother    Hypertension Mother    Hyperlipidemia Mother    Other Mother        sarcoma   Breast cancer Maternal Aunt        x3 maternal aunts   Lung cancer Maternal Uncle    Diabetes Maternal Grandfather    Hyperlipidemia Maternal Grandfather    Stroke Maternal Grandfather    Breast cancer Cousin     Social History: Social History   Tobacco Use   Smoking status: Never   Smokeless tobacco: Never  Vaping Use   Vaping Use: Never used  Substance Use Topics   Alcohol use: Yes    Comment: "rarely"   Drug use: No   Social History   Social History Narrative   Lives with her dog   Regular exercise-yes   Caffeine Use- 64 oz iced coffee at night while working 12 hour shift   Left handed     Vital Signs:  BP (!) 148/87   Pulse 68   Ht 5\' 5"  (1.651 m)   Wt 175 lb 6.4 oz (79.6 kg)   SpO2 99%   BMI 29.19 kg/m     Neurological Exam: MENTAL STATUS including orientation to time, place, person, recent and remote memory, attention span and concentration, language, and fund of knowledge is normal.  Speech is not dysarthric.  CRANIAL NERVES: II:  No visual field defects.  III-IV-VI: Pupils equal round and reactive to light.  Normal conjugate, extra-ocular eye movements in all  directions of gaze.  No nystagmus.  No ptosis.   V:  Normal facial sensation.    VII:  Normal facial symmetry and movements.   VIII:  Normal hearing and vestibular function.   IX-X:  Normal palatal movement.   XI:  Normal shoulder shrug and head rotation.   XII:  Normal tongue strength and range of motion, no deviation or fasciculation.  MOTOR:  No atrophy or fasciculations.  Right ankle supported in brace. Left athetoid movement of the 2nd, 3rd, and 4th toes, continuous; left toe extended.  No pronator drift.   Upper Extremity:  Right  Left  Deltoid  5/5   5/5   Biceps  5/5   5/5   Triceps  5/5   5/5   Infraspinatus 5/5  5/5  Medial pectoralis 5/5  5/5  Wrist extensors  5/5   5/5   Wrist flexors  5/5   5/5   Finger extensors  5/5   5/5   Finger flexors  5/5   5/5   Dorsal interossei  5/5   5/5   Abductor pollicis  5/5   5/5   Tone (Ashworth scale)  0  0   Lower Extremity:  Right  Left  Hip flexors  5/5   5/5   Hip extensors  5/5   5/5   Adductor 5/5  5/5  Abductor 5/5  5/5  Knee flexors  5/5   5/5   Knee extensors  5/5   5/5   Dorsiflexors  5/5   5/5   Plantarflexors  5/5   5/5   Toe extensors  5/5   5/5   Toe flexors  5/5   5/5   Tone (Ashworth scale)  0  0   MSRs:  Right        Left                  brachioradialis 2+  2+  biceps 2+  2+  triceps 2+  2+  patellar 2+  2+  ankle jerk 2+  2+  Hoffman no  no  plantar response down  down   SENSORY:  Normal and symmetric perception of light touch, pinprick, vibration, and proprioception.  Romberg's sign absent.   COORDINATION/GAIT: Normal finger-to-  nose-finger and heel-to-shin.  Intact rapid alternating movements bilaterally.   Gait narrow based and stable. Tandem and stressed gait intact.    IMPRESSION: Right foot and ankle pain, musculoskeletal in nature.  No features on exam to suggest peripheral nerve involvement.  Continue management for tendonitis as per podiatry. Left toes with athetoid movements consistent with diagnosis of painless foot moving toes, as diagnosed by Movement Disorder at [facility]. Patient does not have any paresthesias or pain.  Electrodiagnostic testing not indicated.  It would serve patient best to continue to see [facility] Movement Disorder specialist for ongoing management.   Thank you for allowing me to participate in patient's care.  If I can answer any additional questions, I would be pleased to do so.    Sincerely,    Nyzir Dubois K. Allena Katz, DO

## 2021-05-15 IMAGING — DX DG CHEST 2V
2 series · 2 of 2 positions shown · non-contrast
Comparison: December 26, 2015.

CLINICAL DATA: Chest pain.

EXAM:
CHEST - 2 VIEW

[chest pa]
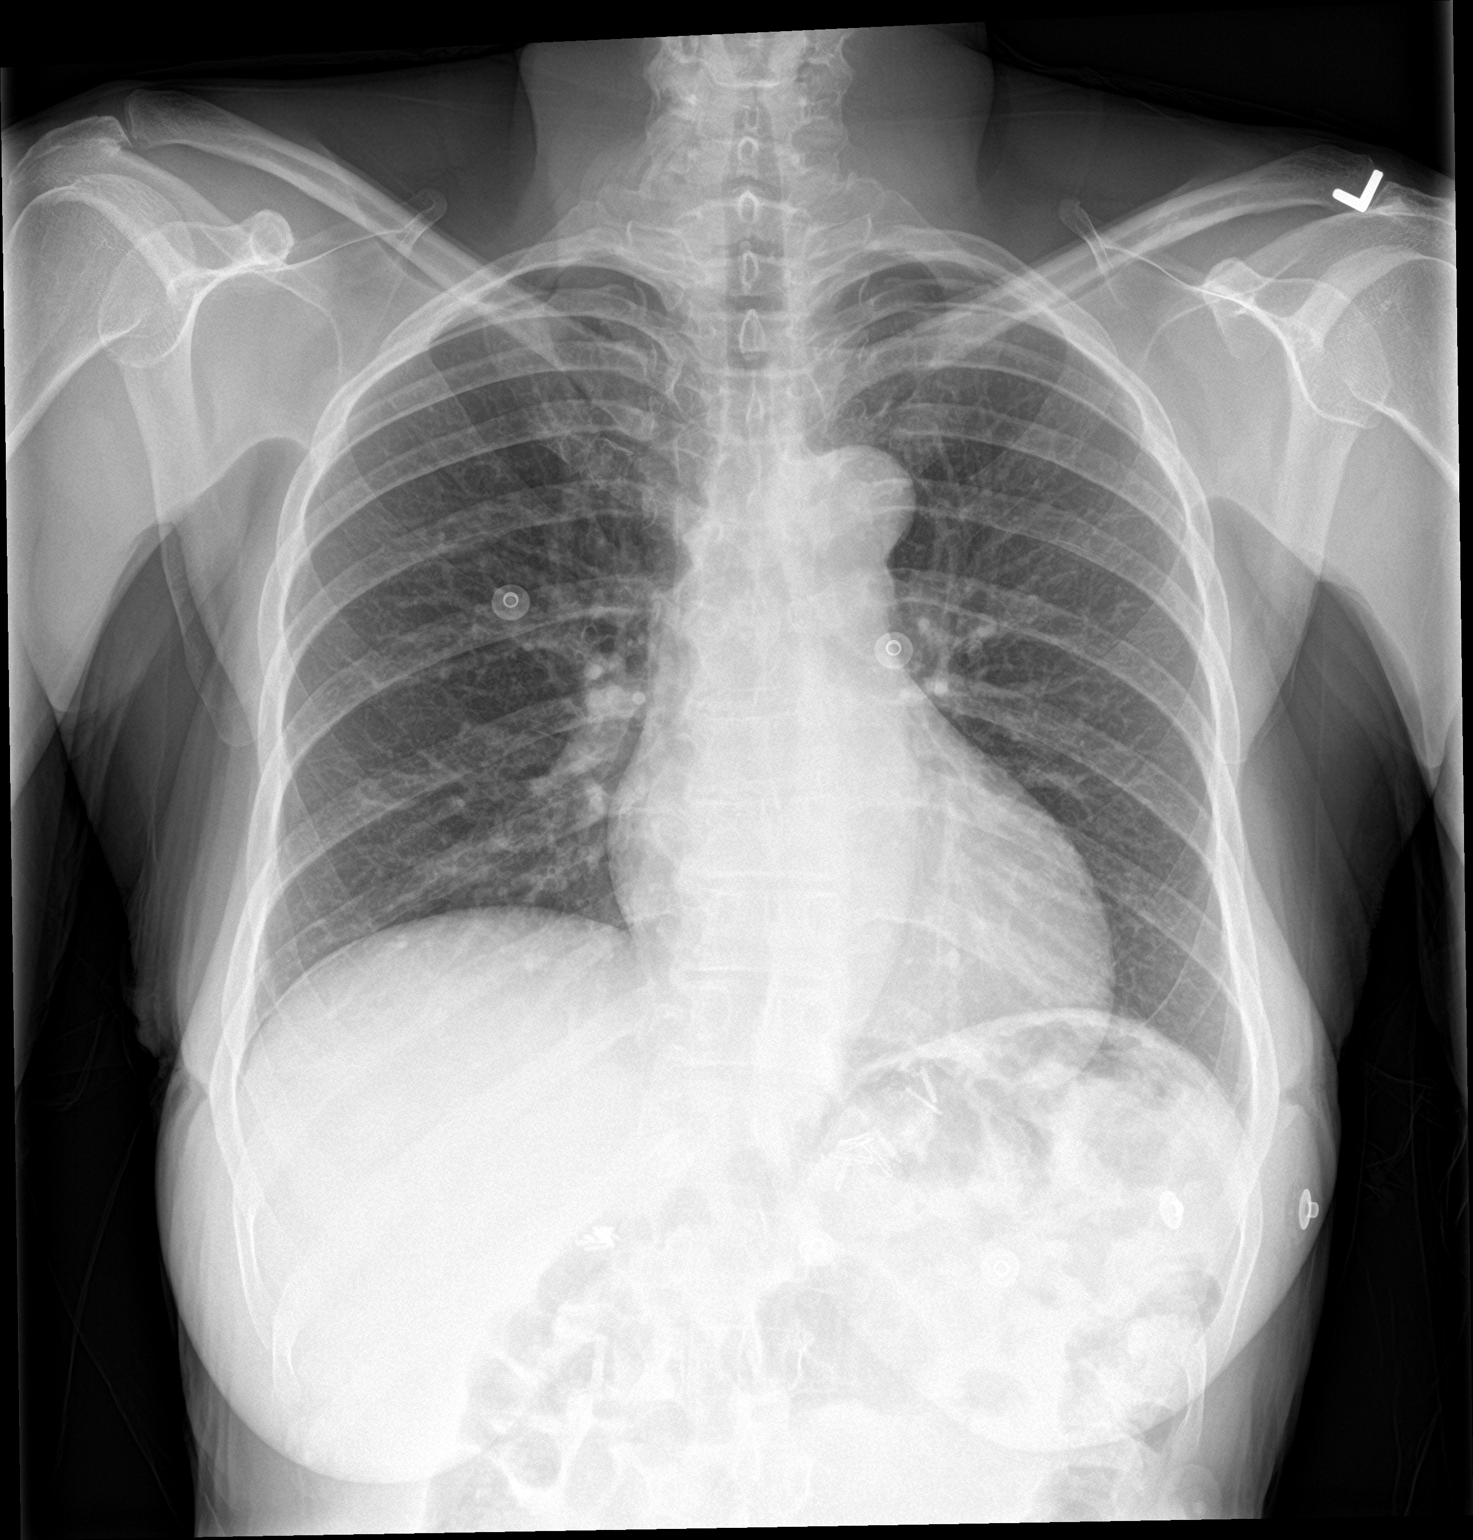

[chest lat]
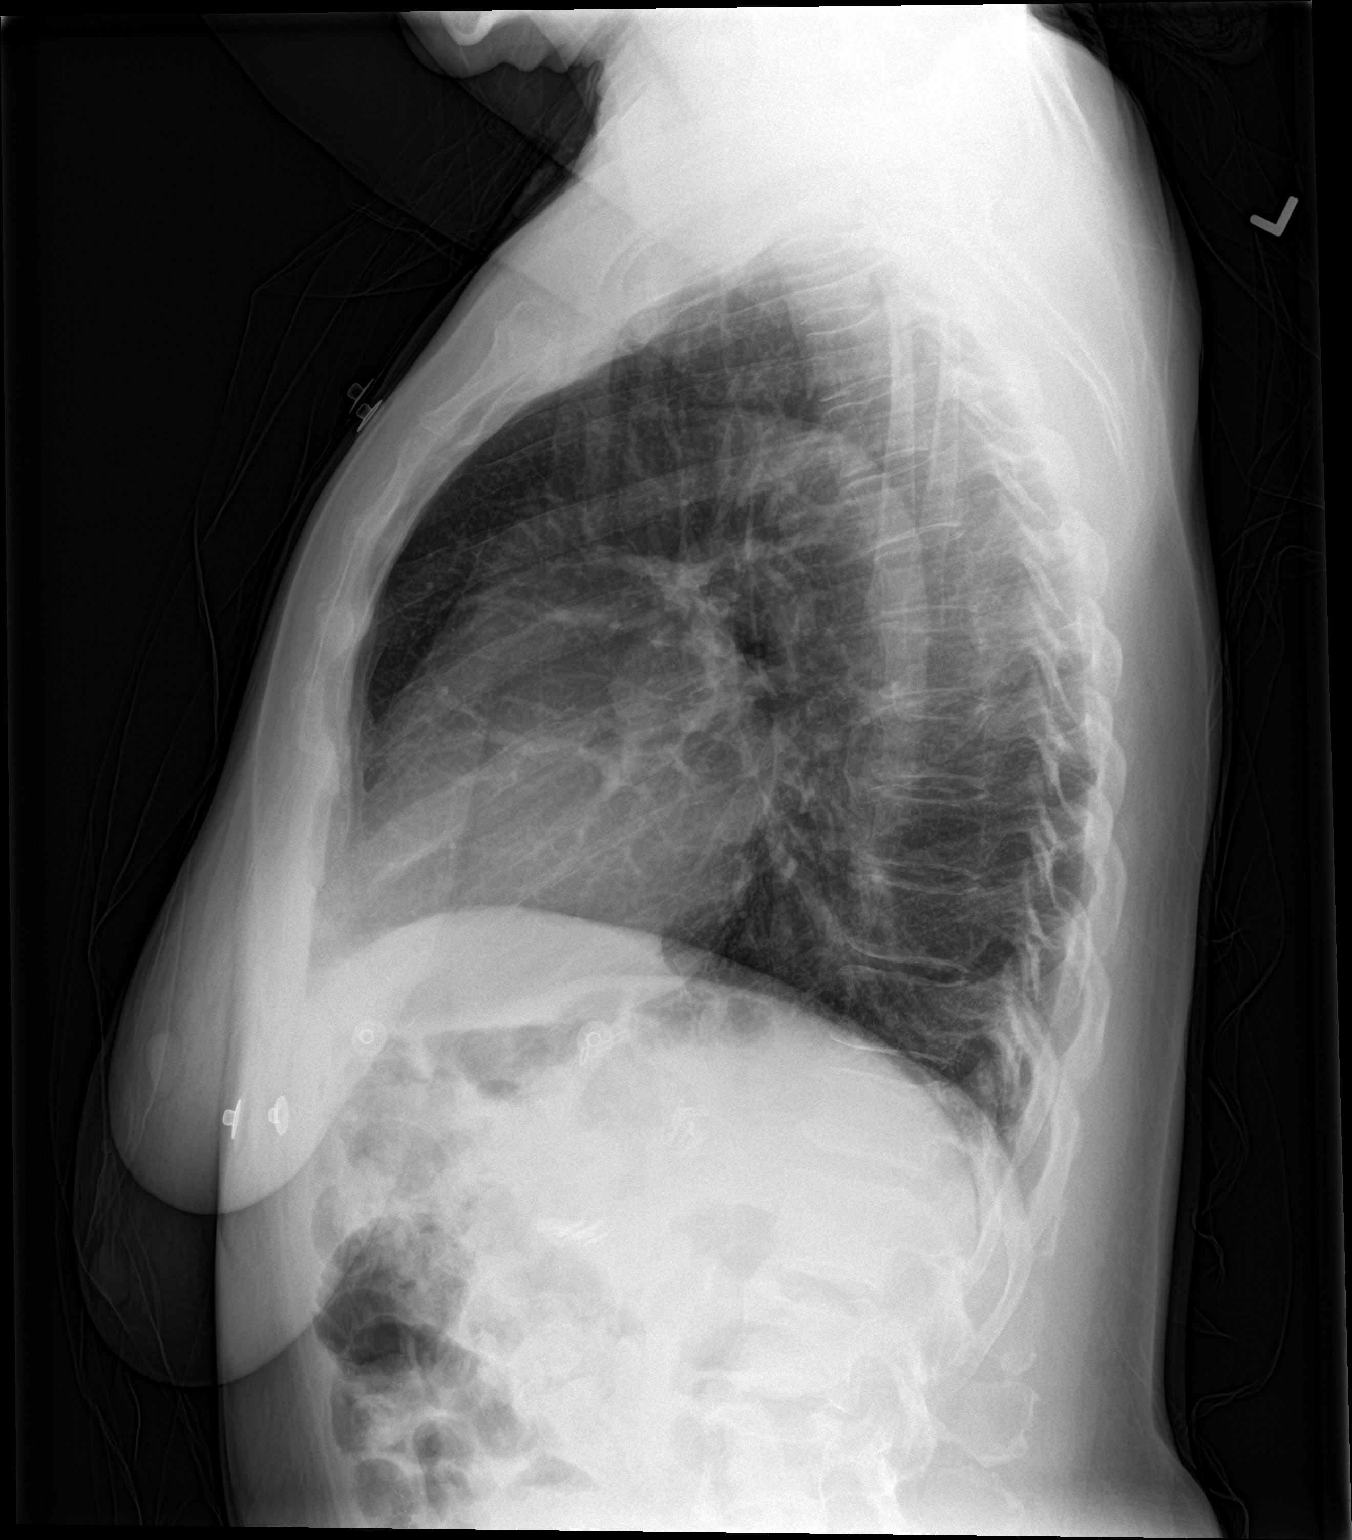

[2 of 2 positions shown; findings below may reference images not displayed]

FINDINGS: The heart size and mediastinal contours are within normal limits.
Both lungs are clear. No pneumothorax or pleural effusion is noted.
The visualized skeletal structures are unremarkable.
IMPRESSION: No active cardiopulmonary disease.

## 2021-07-26 ENCOUNTER — Other Ambulatory Visit: Payer: Self-pay | Admitting: Sports Medicine

## 2021-07-26 ENCOUNTER — Ambulatory Visit
Admission: RE | Admit: 2021-07-26 | Discharge: 2021-07-26 | Disposition: A | Discharge status: Home / Self Care | Payer: 59 | Source: Ambulatory Visit | Attending: Sports Medicine | Admitting: Sports Medicine

## 2021-07-26 ENCOUNTER — Other Ambulatory Visit: Payer: Self-pay

## 2021-07-26 DIAGNOSIS — M25551 Pain in right hip: Secondary | ICD-10-CM

## 2021-07-26 DIAGNOSIS — M25562 Pain in left knee: Secondary | ICD-10-CM

## 2021-12-10 ENCOUNTER — Emergency Department (HOSPITAL_COMMUNITY)
Admission: EM | Admit: 2021-12-10 | Discharge: 2021-12-10 | Disposition: A | Discharge status: Home / Self Care | Payer: No Typology Code available for payment source | Attending: Emergency Medicine | Admitting: Emergency Medicine

## 2021-12-10 ENCOUNTER — Encounter (HOSPITAL_COMMUNITY): Payer: Self-pay

## 2021-12-10 ENCOUNTER — Other Ambulatory Visit: Payer: Self-pay

## 2021-12-10 ENCOUNTER — Encounter: Payer: Self-pay | Admitting: Hematology and Oncology

## 2021-12-10 ENCOUNTER — Emergency Department (HOSPITAL_COMMUNITY): Payer: Self-pay

## 2021-12-10 DIAGNOSIS — X503XXA Overexertion from repetitive movements, initial encounter: Secondary | ICD-10-CM | POA: Diagnosis not present

## 2021-12-10 DIAGNOSIS — M25551 Pain in right hip: Secondary | ICD-10-CM | POA: Diagnosis not present

## 2021-12-10 DIAGNOSIS — Y9301 Activity, walking, marching and hiking: Secondary | ICD-10-CM | POA: Diagnosis not present

## 2021-12-10 MED ORDER — METHOCARBAMOL 500 MG PO TABS: 500.0000 mg | ORAL_TABLET | Freq: Four times a day (QID) | ORAL | 0 refills | Status: DC | PRN

## 2021-12-10 NOTE — ED Triage Notes (Signed)
Patient said her right hip has been bothering her from the hip down to her knee. Has been on her feet walking up and down stairs a lot.

## 2021-12-10 NOTE — Discharge Instructions (Signed)
You were seen in the emergency room today with hip pain.  I suspect your symptoms are related to arthritis or possibly a pulled muscle.  The muscle relaxant medication can cause drowsiness and you should not take this at work or if will be driving.  Please follow-up with the orthopedic surgeon listed if your symptoms continue.  Return with any new or suddenly worsening symptoms.

## 2021-12-10 NOTE — ED Provider Notes (Signed)
Emergency Department Provider Note   I have reviewed the triage vital signs and the nursing notes.   HISTORY  Chief Complaint Hip Pain   HPI Michelle Molina is a 56 y.o. female presents to the ED with right hip pain. 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 She was covering more areas than normal this evening and developed some right burning hip pain. No falls. Pain progressively worsened to the point of limping. She took Motrin and topical medication with no improvement and ultimately was sent home.    Past Medical History:  Diagnosis Date   Allergy    Anemia    Hiatal hernia    Migraines    PONV (postoperative nausea and vomiting)    Ulcer     Review of Systems  Constitutional: No fever/chills Cardiovascular: Denies chest pain. Respiratory: Denies shortness of breath. Gastrointestinal: No abdominal pain.  No nausea, no vomiting.  No diarrhea.  No constipation. Genitourinary: Negative for dysuria. Musculoskeletal: Positive right hip pain.  Skin: Negative for rash. Neurological: Negative for headaches.  ____________________________________________   PHYSICAL EXAM:  VITAL SIGNS: ED Triage Vitals  Enc Vitals Group     BP 12/10/21 0256 (!) 169/100     Pulse Rate 12/10/21 0256 70     Resp 12/10/21 0256 18     Temp 12/10/21 0256 98 F (36.7 C)     Temp Source 12/10/21 0256 Oral     SpO2 12/10/21 0256 100 %     Weight 12/10/21 0256 175 lb (79.4 kg)     Height 12/10/21 0256 5\' 5"  (1.651 m)   Constitutional: Alert and oriented. Well appearing and in no acute distress. Eyes: Conjunctivae are normal.  Head: Atraumatic. Nose: No congestion/rhinnorhea. Mouth/Throat: Mucous membranes are moist.   Neck: No stridor.   Cardiovascular: Normal rate, regular rhythm. Good peripheral circulation. Grossly normal heart sounds.   Respiratory: Normal respiratory effort.  No retractions. Lungs CTAB. Gastrointestinal: Soft and nontender. No  distention.  Musculoskeletal: No lower extremity tenderness nor edema. No gross deformities of extremities. Neurologic:  Normal speech and language. No gross focal neurologic deficits are appreciated.  Skin:  Skin is warm, dry and intact. No rash noted.  ____________________________________________  RADIOLOGY  DG Hip Unilat W or Wo Pelvis 2-3 Views Right  Result Date: 12/10/2021 CLINICAL DATA:  56 year old female with history of right-sided hip pain for 1 day. EXAM: DG HIP (WITH OR WITHOUT PELVIS) 2-3V RIGHT COMPARISON:  Right hip radiograph 07/26/2021. FINDINGS: AP view of the bony pelvis demonstrates no acute displaced fractures of the bony pelvic ring. Bilateral proximal femurs as visualized are intact, and the right femoral head is properly located. There is joint space narrowing, subchondral sclerosis and osteophyte formation in the hip joints bilaterally indicative of mild osteoarthritis. IMPRESSION: 1. No acute radiographic abnormality of the bony pelvis or either hip. 2. Mild bilateral hip joint osteoarthritis. Electronically Signed   By: 09/23/2021 M.D.   On: 12/10/2021 05:33    ____________________________________________   PROCEDURES  Procedure(s) performed:   Procedures  None  ____________________________________________   INITIAL IMPRESSION / ASSESSMENT AND PLAN / ED COURSE  Pertinent labs & imaging results that were available during my care of the patient were reviewed by me and considered in my medical decision making (see chart for details).   This patient is Presenting for Evaluation of hip pain, which does require a range of treatment options, and is a complaint that involves  a high risk of morbidity and mortality.  The Differential Diagnoses includes fracture, dislocation, muscle strain, septic hip.   Radiologic Tests Ordered, included hip x-ray. I independently interpreted the images and agree with radiology interpretation.   Medical Decision Making:  Summary:  Patient presents emergency department with right hip pain worsening during her shift at work.  No fall or specific injury.  X-ray with no acute bony abnormality.  She does have some osteoarthritis which may be contributing.  Reevaluation with update and discussion with patient.  Reviewed x-ray results with patient.  Plan for muscle relaxing medications.  She cannot do NSAIDs due to her gastric bypass.  Gave contact information for orthopedics on-call.  Disposition: discharge  ____________________________________________  FINAL CLINICAL IMPRESSION(S) / ED DIAGNOSES  Final diagnoses:  Right hip pain     NEW OUTPATIENT MEDICATIONS STARTED DURING THIS VISIT:  New Prescriptions   METHOCARBAMOL (ROBAXIN) 500 MG TABLET    Take 1 tablet (500 mg total) by mouth every 6 (six) hours as needed for muscle spasms.    Note:  This document was prepared using Dragon voice recognition software and may include unintentional dictation errors.  Alona Bene, MD, [facility]    Maree Ainley, Arlyss Repress, MD 12/10/21 [phone]

## 2021-12-14 ENCOUNTER — Encounter: Payer: Self-pay | Admitting: Hematology and Oncology

## 2022-03-03 ENCOUNTER — Encounter (HOSPITAL_BASED_OUTPATIENT_CLINIC_OR_DEPARTMENT_OTHER): Payer: Self-pay | Admitting: Emergency Medicine

## 2022-03-03 ENCOUNTER — Emergency Department (HOSPITAL_BASED_OUTPATIENT_CLINIC_OR_DEPARTMENT_OTHER)
Admission: EM | Admit: 2022-03-03 | Discharge: 2022-03-03 | Disposition: A | Discharge status: Home / Self Care | Payer: No Typology Code available for payment source | Attending: Emergency Medicine | Admitting: Emergency Medicine

## 2022-03-03 ENCOUNTER — Other Ambulatory Visit: Payer: Self-pay

## 2022-03-03 ENCOUNTER — Encounter: Payer: Self-pay | Admitting: Hematology and Oncology

## 2022-03-03 ENCOUNTER — Emergency Department (HOSPITAL_BASED_OUTPATIENT_CLINIC_OR_DEPARTMENT_OTHER): Payer: 59 | Admitting: Radiology

## 2022-03-03 DIAGNOSIS — Y990 Civilian activity done for income or pay: Secondary | ICD-10-CM | POA: Insufficient documentation

## 2022-03-03 DIAGNOSIS — M25551 Pain in right hip: Secondary | ICD-10-CM | POA: Diagnosis not present

## 2022-03-03 MED ORDER — LIDOCAINE 5 % EX PTCH
1.0000 | MEDICATED_PATCH | Freq: Once | CUTANEOUS | Status: DC
  Administered 2022-03-03: 1 via TRANSDERMAL
  Filled 2022-03-03: qty 1

## 2022-03-03 MED ORDER — ACETAMINOPHEN 500 MG PO TABS
1000.0000 mg | ORAL_TABLET | Freq: Once | ORAL | Status: AC
  Administered 2022-03-03: 1000 mg via ORAL
  Filled 2022-03-03: qty 2

## 2022-03-03 NOTE — ED Triage Notes (Signed)
Right groin pain , going on for about a month, it has become increasingly worse, she has been walking more at work and up/down steps due to short staffed.

## 2022-03-03 NOTE — Discharge Instructions (Addendum)
You were seen today for hip and right groin pain. We did not identify any emergent cause for your symptoms. Your evaluation is most consistent with nonspecific musculoskeletal etiology.  We also discussed possibility may be a hernia though it does not appear to be 1 at this time.  We will recommend you follow-up with physical therapy and orthopedics given your degenerative hip changes.  Plan and next steps:   The following may be helpful in managing your symptoms:   Pain- Lidocaine Patches  Apply to affected area for up to 12 hours at a time.   Pain/Fever- Adult Tylenol dosing:  650 mg orally every 4 to 6 hours as needed, MAX: 3250 mg/24 hours   (Extra-strength) 1000 mg orally every 6 hours as needed; MAX: 3000 mg/24 hours   Do not use if you have liver disease. Read the label on the bottle.   Pain/Fever- Adult Ibuprofen Dosing  200 to 400 mg orally every 4 to 6 hours as needed; MAX 1200 mg/day; do not take longer than 10 days   Do not use if you have kidney disease. Read the label on the bottle   Findings:  You may see all of your lab and imaging results utilizing our online portal! Look in this document or ask a team member for your mychart* access information. The most notable results have additionally been verbally communicated with you and your bedside family.    Follow-up Plan:   Follow up with the patient's normal primary care provider for monitoring of this condition within 48 hours.   Signs/Symptoms that would warrant return to the ED:  Please return to the ED if you experience worsening of symptoms or any abrupt changes in your health. Standard of care precautions for your chief complaint have already been verbally communicated with you. Always be on alert for fevers, chills, shortness of breath, chest pains, or sudden changes that warrant immediate evaluation.    Thank you for allowing [place] to be a part of you and your families' care.   Glyn Ade MD

## 2022-03-03 NOTE — ED Provider Notes (Signed)
[facility] EMERGENCY DEPT Provider Note   CSN: [id number] Arrival date & time: 03/03/22  0736     History Chief Complaint  Patient presents with   Groin Pain    HPI Michelle Molina is a 56 y.o. female presenting for right hip pain.  She is an otherwise healthy 56 year old female.  She denies fevers or chills nausea vomiting syncope shortness of breath.  She is ambulatory but has substantial pain when she first wakes up in the morning in her right hip.  She states that it got worse when she had increased responsibilities at work due to short staffing.  She has to climb stairs frequently and walks very far in a day.  She has been using Tylenol intermittently, ibuprofen with increased doses without substantial improvement.. She states that she was told that she was not taking ibuprofen this much and is coming in for further pain control recommendations.  Denies any bowel movement changes. No concern for hernia or soft tissue swelling at this base.  Endorses her pain is worse in the morning.  Patient's recorded medical, surgical, social, medication list and allergies were reviewed in the Snapshot window as part of the initial history.   Review of Systems   Review of Systems  Constitutional:  Negative for chills and fever.  HENT:  Negative for ear pain and sore throat.   Eyes:  Negative for pain and visual disturbance.  Respiratory:  Negative for cough and shortness of breath.   Cardiovascular:  Negative for chest pain and palpitations.  Gastrointestinal:  Negative for abdominal pain and vomiting.  Genitourinary:  Negative for dysuria and hematuria.  Musculoskeletal:  Negative for arthralgias and back pain.  Skin:  Negative for color change and rash.  Neurological:  Negative for seizures and syncope.  All other systems reviewed and are negative.   Physical Exam Updated Vital Signs BP 121/82   Pulse 78   Temp 98.1 F (36.7 C) (Oral)   Resp 20   SpO2 100%  Physical  Exam Vitals and nursing note reviewed.  Constitutional:      General: She is not in acute distress.    Appearance: She is well-developed.  HENT:     Head: Normocephalic and atraumatic.  Eyes:     Conjunctiva/sclera: Conjunctivae normal.  Cardiovascular:     Rate and Rhythm: Normal rate and regular rhythm.     Heart sounds: No murmur heard. Pulmonary:     Effort: Pulmonary effort is normal. No respiratory distress.     Breath sounds: Normal breath sounds.  Abdominal:     General: There is no distension.     Palpations: Abdomen is soft.     Tenderness: There is no abdominal tenderness. There is no right CVA tenderness or left CVA tenderness.     Hernia: No hernia is present.  Musculoskeletal:        General: No swelling or tenderness. Normal range of motion.     Cervical back: Neck supple.  Skin:    General: Skin is warm and dry.  Neurological:     General: No focal deficit present.     Mental Status: She is alert and oriented to person, place, and time. Mental status is at baseline.     Cranial Nerves: No cranial nerve deficit.      ED Course/ Medical Decision Making/ A&P    Procedures Procedures   Medications Ordered in ED Medications  lidocaine (LIDODERM) 5 % 1 patch (1 patch Transdermal Patch Applied  03/03/22 0836)  acetaminophen (TYLENOL) tablet 1,000 mg (1,000 mg Oral Given 03/03/22 0836)    Medical Decision Making:    [name] is a 56 y.o. female who presented to the ED today with right groin pain detailed above.     Patient's presentation is complicated by their history of bariatric surgery status post 175 pound weight loss.  Patient placed on continuous vitals and telemetry monitoring while in ED which was reviewed periodically.   Complete initial physical exam performed, notably the patient  was hemodynamically stable in no acute distress.      Reviewed and confirmed nursing documentation for past medical history, family history, social history.     Initial Assessment:   With the patient's presentation of right-sided hip pain, most likely diagnosis is musculoskeletal etiology versus ligamentous injury. Other diagnoses were considered including (but not limited to) hernia though none is appreciated on abdominal exam, possibly pelvic floor pain given her extensive weight loss, considered UTI the patient has no dysuria or pain.,  Nephrolithiasis less likely due to the lack of radiation to flank and mild nature of pain. These are considered less likely due to history of present illness and physical exam findings.   This is most consistent with an acute complicated illness  Initial Plan:  X-ray of right hip to ensure no evidence of slipped capital femoral epiphysis or other substantial intra-articular injury Empiric treatment of patient's symptoms with anti-inflammatories and supportive care measures reinforced to the patient  Objective evaluation as below reviewed with plan for close reassessment  Initial Study Results:   Radiology  All images reviewed independently. Agree with radiology report at this time.   DG Hip Unilat W or Wo Pelvis 2-3 Views Right  Result Date: 03/03/2022 CLINICAL DATA:  Right groin pain. EXAM: DG HIP (WITH OR WITHOUT PELVIS) 2-3V RIGHT COMPARISON:  12/10/2021 FINDINGS: There is no evidence of hip fracture or dislocation. Mild degenerative spurring noted in each acetabulum. SI joints and symphysis pubis unremarkable. No right femoral neck fracture. IMPRESSION: Stable.  Mild degenerative change in the hips.  No acute findings. Electronically Signed   By: Kennith Center M.D.   On: 03/03/2022 08:52    Final Assessment and Plan:   On repeat assessment, there is no obvious fracture appreciated.  However she does have degenerative changes.  I have referred patient to physical therapy for initial treatment and recommended she obtain follow-up with an orthopedic surgeon in the outpatient setting as well as start  anti-inflammatories such as Tylenol in the interim.  Supportive care reinforced, no acute indication for further intervention appreciated and patient stable for outpatient care and management.  Patient discharged with no further acute events.   Clinical Impression:  1. Pain of right hip      Discharge   Final Clinical Impression(s) / ED Diagnoses Final diagnoses:  Pain of right hip    Rx / DC Orders ED Discharge Orders          Ordered    Ambulatory referral to Physical Therapy        03/03/22 0858              Glyn Ade, MD 03/03/22 [phone]

## 2022-03-03 NOTE — ED Notes (Signed)
Dc instructions reviewed with patient. Patient voiced understanding. Dc with belongings.  °

## 2022-03-16 ENCOUNTER — Other Ambulatory Visit: Payer: Self-pay | Admitting: Sports Medicine

## 2022-03-16 DIAGNOSIS — M25551 Pain in right hip: Secondary | ICD-10-CM

## 2022-03-26 ENCOUNTER — Encounter: Payer: Self-pay | Admitting: Hematology and Oncology

## 2022-03-30 ENCOUNTER — Ambulatory Visit
Admission: RE | Admit: 2022-03-30 | Discharge: 2022-03-30 | Disposition: A | Discharge status: Home / Self Care | Payer: 59 | Source: Ambulatory Visit | Attending: Sports Medicine | Admitting: Sports Medicine

## 2022-03-30 DIAGNOSIS — M25551 Pain in right hip: Secondary | ICD-10-CM

## 2022-11-09 NOTE — Progress Notes (Signed)
Sent message, via epic in basket, requesting orders in epic from surgeon.  

## 2022-11-10 ENCOUNTER — Ambulatory Visit: Payer: Self-pay | Admitting: Student

## 2022-11-15 NOTE — Progress Notes (Addendum)
COVID Vaccine Completed: yes  Date of COVID positive in last 90 days: no  PCP - Frederic Jericho, PA, recently changed [facility] Cardiologist - n/a  Medical Clearance by Almyra Brace 11/01/22 on chart  Chest x-ray - n/a EKG - 11/16/22 Epic/chart Stress Test - n/a ECHO - n/a Cardiac Cath - n/a Pacemaker/ICD device last checked: n/a Spinal Cord Stimulator: n/a  Bowel Prep - no  Sleep Study - yes, negative CPAP -   Fasting Blood Sugar - borderline, no BS checks at home. Reports hypoglycemia at home x3 Checks Blood Sugar _____ times a day  Last dose of GLP1 agonist-  N/A GLP1 instructions:  N/A   Last dose of SGLT-2 inhibitors-  N/A SGLT-2 instructions: N/A   Blood Thinner Instructions:  n/a Aspirin Instructions: Last Dose:  Activity level: Can go up a flight of stairs and perform activities of daily living without stopping and without symptoms of chest pain or shortness of breath.  Anesthesia review: patient reports 3 episodes of passing out with the most recent event being . She states she checked her blood sugar and it was in the 30s. Denies any symptoms today. BS at PAT 96. Will get an A1C and EKG.   Patient denies shortness of breath, fever, cough and chest pain at PAT appointment  Patient verbalized understanding of instructions that were given to them at the PAT appointment. Patient was also instructed that they will need to review over the PAT instructions again at home before surgery.

## 2022-11-15 NOTE — Patient Instructions (Addendum)
SURGICAL WAITING ROOM VISITATION  Patients having surgery or a procedure may have no more than 2 support people in the waiting area - these visitors may rotate.    Children under the age of 79 must have an adult with them who is not the patient.  Due to an increase in RSV and influenza rates and associated hospitalizations, children ages 61 and under may not visit patients in [facility] hospitals.  If the patient needs to stay at the hospital during part of their recovery, the visitor guidelines for inpatient rooms apply. Pre-op nurse will coordinate an appropriate time for 1 support person to accompany patient in pre-op.  This support person may not rotate.    Please refer to the [facility] website for the visitor guidelines for Inpatients (after your surgery is over and you are in a regular room).    Your procedure is scheduled on: 11/28/22   Report to [facility] Main Entrance    Report to admitting at 11:15 AM   Call this number if you have problems the morning of surgery [phone]   Do not eat food :After Midnight.   After Midnight you may have the following liquids until 10:45 AM DAY OF SURGERY  Water Non-Citrus Juices (without pulp, NO RED-Apple, White grape, White cranberry) Black Coffee (NO MILK/CREAM OR CREAMERS, sugar ok)  Clear Tea (NO MILK/CREAM OR CREAMERS, sugar ok) regular and decaf                             Plain Jell-O (NO RED)                                           Fruit ices (not with fruit pulp, NO RED)                                     Popsicles (NO RED)                                                               Sports drinks like Gatorade (NO RED)                  The day of surgery:  Drink ONE (1) Pre-Surgery Clear Ensure at 10:45 AM the morning of surgery. Drink in one sitting. Do not sip.  This drink was given to you during your hospital  pre-op appointment visit. Nothing else to drink after completing the  Pre-Surgery Clear  Ensure.          If you have questions, please contact your surgeon's office.   FOLLOW BOWEL PREP AND ANY ADDITIONAL PRE OP INSTRUCTIONS YOU RECEIVED FROM YOUR SURGEON'S OFFICE!!!     Oral Hygiene is also important to reduce your risk of infection.                                    Remember - BRUSH YOUR TEETH THE MORNING OF SURGERY WITH YOUR REGULAR TOOTHPASTE  DENTURES  WILL BE REMOVED PRIOR TO SURGERY PLEASE DO NOT APPLY "Poly grip" OR ADHESIVES!!!   Take these medicines the morning of surgery with A SIP OF WATER: Tylenol                               You may not have any metal on your body including hair pins, jewelry, and body piercing             Do not wear make-up, lotions, powders, perfumes, or deodorant  Do not wear nail polish including gel and S&S, artificial/acrylic nails, or any other type of covering on natural nails including finger and toenails. If you have artificial nails, gel coating, etc. that needs to be removed by a nail salon please have this removed prior to surgery or surgery may need to be canceled/ delayed if the surgeon/ anesthesia feels like they are unable to be safely monitored.   Do not shave  48 hours prior to surgery.    Do not bring valuables to the hospital. [facility] IS NOT             RESPONSIBLE   FOR VALUABLES.   Contacts, glasses, dentures or bridgework may not be worn into surgery.   Bring small overnight bag day of surgery.   DO NOT BRING YOUR HOME MEDICATIONS TO THE HOSPITAL. PHARMACY WILL DISPENSE MEDICATIONS LISTED ON YOUR MEDICATION LIST TO YOU DURING YOUR ADMISSION IN THE HOSPITAL!   Special Instructions: Bring a copy of your healthcare power of attorney and living will documents the day of surgery if you haven't scanned them before.              Please read over the following fact sheets you were given: IF YOU HAVE QUESTIONS ABOUT YOUR PRE-OP INSTRUCTIONS PLEASE CALL [name]    If you received a COVID test during  your pre-op visit  it is requested that you wear a mask when out in public, stay away from anyone that may not be feeling well and notify your surgeon if you develop symptoms. If you test positive for Covid or have been in contact with anyone that has tested positive in the last 10 days please notify you surgeon.      Pre-operative 5 CHG Bath Instructions   You can play a key role in reducing the risk of infection after surgery. Your skin needs to be as free of germs as possible. You can reduce the number of germs on your skin by washing with CHG (chlorhexidine gluconate) soap before surgery. CHG is an antiseptic soap that kills germs and continues to kill germs even after washing.   DO NOT use if you have an allergy to chlorhexidine/CHG or antibacterial soaps. If your skin becomes reddened or irritated, stop using the CHG and notify one of our RNs at [phone].   Please shower with the CHG soap starting 4 days before surgery using the following schedule:     Please keep in mind the following:  DO NOT shave, including legs and underarms, starting the day of your first shower.   You may shave your face at any point before/day of surgery.  Place clean sheets on your bed the day you start using CHG soap. Use a clean washcloth (not used since being washed) for each shower. DO NOT sleep with pets once you start using the CHG.   CHG Shower Instructions:  If you choose to wash  your hair and private area, wash first with your normal shampoo/soap.  After you use shampoo/soap, rinse your hair and body thoroughly to remove shampoo/soap residue.  Turn the water OFF and apply about 3 tablespoons (45 ml) of CHG soap to a CLEAN washcloth.  Apply CHG soap ONLY FROM YOUR NECK DOWN TO YOUR TOES (washing for 3-5 minutes)  DO NOT use CHG soap on face, private areas, open wounds, or sores.  Pay special attention to the area where your surgery is being performed.  If you are having back surgery, having  someone wash your back for you may be helpful. Wait 2 minutes after CHG soap is applied, then you may rinse off the CHG soap.  Pat dry with a clean towel  Put on clean clothes/pajamas   If you choose to wear lotion, please use ONLY the CHG-compatible lotions on the back of this paper.     Additional instructions for the day of surgery: DO NOT APPLY any lotions, deodorants, cologne, or perfumes.   Put on clean/comfortable clothes.  Brush your teeth.  Ask your nurse before applying any prescription medications to the skin.      CHG Compatible Lotions   Aveeno Moisturizing lotion  Cetaphil Moisturizing Cream  Cetaphil Moisturizing Lotion  Clairol Herbal Essence Moisturizing Lotion, Dry Skin  Clairol Herbal Essence Moisturizing Lotion, Extra Dry Skin  Clairol Herbal Essence Moisturizing Lotion, Normal Skin  Curel Age Defying Therapeutic Moisturizing Lotion with Alpha Hydroxy  Curel Extreme Care Body Lotion  Curel Soothing Hands Moisturizing Hand Lotion  Curel Therapeutic Moisturizing Cream, Fragrance-Free  Curel Therapeutic Moisturizing Lotion, Fragrance-Free  Curel Therapeutic Moisturizing Lotion, Original Formula  Eucerin Daily Replenishing Lotion  Eucerin Dry Skin Therapy Plus Alpha Hydroxy Crme  Eucerin Dry Skin Therapy Plus Alpha Hydroxy Lotion  Eucerin Original Crme  Eucerin Original Lotion  Eucerin Plus Crme Eucerin Plus Lotion  Eucerin TriLipid Replenishing Lotion  Keri Anti-Bacterial Hand Lotion  Keri Deep Conditioning Original Lotion Dry Skin Formula Softly Scented  Keri Deep Conditioning Original Lotion, Fragrance Free Sensitive Skin Formula  Keri Lotion Fast Absorbing Fragrance Free Sensitive Skin Formula  Keri Lotion Fast Absorbing Softly Scented Dry Skin Formula  Keri Original Lotion  Keri Skin Renewal Lotion Keri Silky Smooth Lotion  Keri Silky Smooth Sensitive Skin Lotion  Nivea Body Creamy Conditioning Oil  Nivea Body Extra Enriched Lotion  Nivea Body  Original Lotion  Nivea Body Sheer Moisturizing Lotion Nivea Crme  Nivea Skin Firming Lotion  NutraDerm 30 Skin Lotion  NutraDerm Skin Lotion  NutraDerm Therapeutic Skin Cream  NutraDerm Therapeutic Skin Lotion  ProShield Protective Hand Cream  Provon moisturizing lotion    Incentive Spirometer  An incentive spirometer is a tool that can help keep your lungs clear and active. This tool measures how well you are filling your lungs with each breath. Taking long deep breaths may help reverse or decrease the chance of developing breathing (pulmonary) problems (especially infection) following: A long period of time when you are unable to move or be active. BEFORE THE PROCEDURE  If the spirometer includes an indicator to show your best effort, your nurse or respiratory therapist will set it to a desired goal. If possible, sit up straight or lean slightly forward. Try not to slouch. Hold the incentive spirometer in an upright position. INSTRUCTIONS FOR USE  Sit on the edge of your bed if possible, or sit up as far as you can in bed or on a chair. Hold the incentive spirometer in  an upright position. Breathe out normally. Place the mouthpiece in your mouth and seal your lips tightly around it. Breathe in slowly and as deeply as possible, raising the piston or the ball toward the top of the column. Hold your breath for 3-5 seconds or for as long as possible. Allow the piston or ball to fall to the bottom of the column. Remove the mouthpiece from your mouth and breathe out normally. Rest for a few seconds and repeat Steps 1 through 7 at least 10 times every 1-2 hours when you are awake. Take your time and take a few normal breaths between deep breaths. The spirometer may include an indicator to show your best effort. Use the indicator as a goal to work toward during each repetition. After each set of 10 deep breaths, practice coughing to be sure your lungs are clear. If you have an incision (the  cut made at the time of surgery), support your incision when coughing by placing a pillow or rolled up towels firmly against it. Once you are able to get out of bed, walk around indoors and cough well. You may stop using the incentive spirometer when instructed by your caregiver.  RISKS AND COMPLICATIONS Take your time so you do not get dizzy or light-headed. If you are in pain, you may need to take or ask for pain medication before doing incentive spirometry. It is harder to take a deep breath if you are having pain. AFTER USE Rest and breathe slowly and easily. It can be helpful to keep track of a log of your progress. Your caregiver can provide you with a simple table to help with this. If you are using the spirometer at home, follow these instructions: SEEK MEDICAL CARE IF:  You are having difficultly using the spirometer. You have trouble using the spirometer as often as instructed. Your pain medication is not giving enough relief while using the spirometer. You develop fever of 100.5 F (38.1 C) or higher. SEEK IMMEDIATE MEDICAL CARE IF:  You cough up bloody sputum that had not been present before. You develop fever of 102 F (38.9 C) or greater. You develop worsening pain at or near the incision site. MAKE SURE YOU:  Understand these instructions. Will watch your condition. Will get help right away if you are not doing well or get worse. Document Released: 10/22/2006 Document Revised: 09/03/2011 Document Reviewed: 12/23/2006 [employer] Patient Information 2014 [employer], [place].   ________________________________________________________________________ WHAT IS A BLOOD TRANSFUSION? Blood Transfusion Information  A transfusion is the replacement of blood or some of its parts. Blood is made up of multiple cells which provide different functions. Red blood cells carry oxygen and are used for blood loss replacement. White blood cells fight against infection. Platelets control  bleeding. Plasma helps clot blood. Other blood products are available for specialized needs, such as hemophilia or other clotting disorders. BEFORE THE TRANSFUSION  Who gives blood for transfusions?  Healthy volunteers who are fully evaluated to make sure their blood is safe. This is blood bank blood. Transfusion therapy is the safest it has ever been in the practice of medicine. Before blood is taken from a donor, a complete history is taken to make sure that person has no history of diseases nor engages in risky social behavior (examples are intravenous drug use or sexual activity with multiple partners). The donor's travel history is screened to minimize risk of transmitting infections, such as malaria. The donated blood is tested for signs of infectious diseases, such  as HIV and hepatitis. The blood is then tested to be sure it is compatible with you in order to minimize the chance of a transfusion reaction. If you or a relative donates blood, this is often done in anticipation of surgery and is not appropriate for emergency situations. It takes many days to process the donated blood. RISKS AND COMPLICATIONS Although transfusion therapy is very safe and saves many lives, the main dangers of transfusion include:  Getting an infectious disease. Developing a transfusion reaction. This is an allergic reaction to something in the blood you were given. Every precaution is taken to prevent this. The decision to have a blood transfusion has been considered carefully by your caregiver before blood is given. Blood is not given unless the benefits outweigh the risks. AFTER THE TRANSFUSION Right after receiving a blood transfusion, you will usually feel much better and more energetic. This is especially true if your red blood cells have gotten low (anemic). The transfusion raises the level of the red blood cells which carry oxygen, and this usually causes an energy increase. The nurse administering the  transfusion will monitor you carefully for complications. HOME CARE INSTRUCTIONS  No special instructions are needed after a transfusion. You may find your energy is better. Speak with your caregiver about any limitations on activity for underlying diseases you may have. SEEK MEDICAL CARE IF:  Your condition is not improving after your transfusion. You develop redness or irritation at the intravenous (IV) site. SEEK IMMEDIATE MEDICAL CARE IF:  Any of the following symptoms occur over the next 12 hours: Shaking chills. You have a temperature by mouth above 102 F (38.9 C), not controlled by medicine. Chest, back, or muscle pain. People around you feel you are not acting correctly or are confused. Shortness of breath or difficulty breathing. Dizziness and fainting. You get a rash or develop hives. You have a decrease in urine output. Your urine turns a dark color or changes to pink, red, or brown. Any of the following symptoms occur over the next 10 days: You have a temperature by mouth above 102 F (38.9 C), not controlled by medicine. Shortness of breath. Weakness after normal activity. The white part of the eye turns yellow (jaundice). You have a decrease in the amount of urine or are urinating less often. Your urine turns a dark color or changes to pink, red, or brown. Document Released: 06/08/2000 Document Revised: 09/03/2011 Document Reviewed: 01/26/2008 [facility] Patient Information 2014 [place], [place].  _______________________________________________________________________

## 2022-11-16 ENCOUNTER — Encounter (HOSPITAL_COMMUNITY)
Admission: RE | Admit: 2022-11-16 | Discharge: 2022-11-16 | Disposition: A | Discharge status: Home / Self Care | Payer: 59 | Source: Ambulatory Visit | Attending: Orthopedic Surgery | Admitting: Orthopedic Surgery

## 2022-11-16 ENCOUNTER — Encounter (HOSPITAL_COMMUNITY): Payer: Self-pay

## 2022-11-16 ENCOUNTER — Other Ambulatory Visit: Payer: Self-pay

## 2022-11-16 VITALS — BP 151/95 | HR 64 | Temp 97.8°F | Resp 14 | Ht 64.75 in | Wt 178.0 lb

## 2022-11-16 DIAGNOSIS — Z01818 Encounter for other preprocedural examination: Secondary | ICD-10-CM | POA: Diagnosis present

## 2022-11-16 DIAGNOSIS — R7303 Prediabetes: Secondary | ICD-10-CM | POA: Insufficient documentation

## 2022-11-16 DIAGNOSIS — I10 Essential (primary) hypertension: Secondary | ICD-10-CM

## 2022-11-16 HISTORY — DX: Sickle-cell trait: D57.3

## 2022-11-16 HISTORY — DX: Gastro-esophageal reflux disease without esophagitis: K21.9

## 2022-11-16 LAB — SURGICAL PCR SCREEN
MRSA, PCR: NEGATIVE
Staphylococcus aureus: NEGATIVE

## 2022-11-16 LAB — TYPE AND SCREEN
ABO/RH(D): O POS
Antibody Screen: NEGATIVE

## 2022-11-16 LAB — GLUCOSE, CAPILLARY: Glucose-Capillary: 96 mg/dL (ref 70–99)

## 2022-11-17 LAB — HEMOGLOBIN A1C
Hgb A1c MFr Bld: 6.2 % — ABNORMAL HIGH (ref 4.8–5.6)
Mean Plasma Glucose: 131 mg/dL

## 2022-11-20 NOTE — Progress Notes (Signed)
Case: [id number] Date/Time: 11/28/22 1330   Procedure: TOTAL HIP ARTHROPLASTY ANTERIOR APPROACH (Right: Hip) - 150   Anesthesia type: Spinal   Pre-op diagnosis: Right hip osteoarthritis   Location: [facility] ROOM 06 / [facility]   Surgeons: Samson Frederic, MD       DISCUSSION: Michelle Molina is a 57 year old female who presents to PAT prior to surgery listed above. Past medical history of migraines, neuropathy, prediabetes, PUD, anemia, and hx of gastric bypass.  Prior anesthesia complications include PONV.  Pt follows with Family Medicine for prediabetes. Her HgA1c was 5.7 in January 2024. The patient mentioned to the PAT nurse that she's had several syncopal episodes in the past which were related to hypoglycemia. She has mentioned this to her PCP during an office visit on 11/05/22 who states:  "She tells me that she has actually had syncopal episodes due to low sugar. Her boyfriend is a Engineer, civil (consulting) and he checked it 1 time and it was 37. We talked about how to treat hypoglycemia. She has been evaluated for this in the past. We discussed a proper diet for hypoglycemia but she says she really cannot eat that way. It is just too frequent and her stomach does not tolerate it well. Hopefully she will be able to transition some of her foods to higher protein to help with this."   Her BP has been elevated as well. She is not on medication for this. Her PCP noted this in her last office visit and states she will be following up on it.   VS: BP (!) 151/95   Pulse 64   Temp 36.6 C (Oral)   Resp 14   Ht 5' 4.75" (1.645 m)   Wt 80.7 kg   LMP 09/23/2018   SpO2 100%   BMI 29.85 kg/m   PROVIDERS: Frederic Jericho, PA-C   LABS: Labs reviewed: Acceptable for surgery. (all labs ordered are listed, but only abnormal results are displayed)  Labs Reviewed  HEMOGLOBIN A1C - Abnormal; Notable for the following components:      Result Value   Hgb A1c MFr Bld 6.2 (*)    All other components within normal limits   SURGICAL PCR SCREEN  GLUCOSE, CAPILLARY  TYPE AND SCREEN     IMAGES: n/a   EKG 11/16/22:  NSR No significant change from prior   CV: n/a  Past Medical History:  Diagnosis Date   Allergy    Anemia    GERD (gastroesophageal reflux disease)    Hiatal hernia    Migraines    PONV (postoperative nausea and vomiting)    Sickle cell trait (HCC)    Ulcer     Past Surgical History:  Procedure Laterality Date   CESAREAN SECTION  1997, 1999   x2   CHOLECYSTECTOMY  1997   COLONOSCOPY     GASTRIC BYPASS  2013   HERNIA REPAIR     hiatal hernia   HYSTEROSCOPY  2006   KNEE SURGERY  2001   left   MYOMECTOMY  2010   SALPINGECTOMY Left    SMALL INTESTINE SURGERY     UPPER GI ENDOSCOPY     "several"    MEDICATIONS:  Ascorbic Acid (VITAMIN C) 1000 MG tablet   calcium citrate (CALCITRATE - DOSED IN MG ELEMENTAL CALCIUM) 950 (200 Ca) MG tablet   hydrochlorothiazide (HYDRODIURIL) 25 MG tablet   acetaminophen (TYLENOL) 500 MG tablet   meloxicam (MOBIC) 7.5 MG tablet   No current facility-administered medications for this encounter.  Marcille Blanco [facility] Short Stay/[facility] Phone [phone] 11/23/2022 12:45 PM

## 2022-11-25 ENCOUNTER — Ambulatory Visit: Payer: Self-pay | Admitting: Student

## 2022-11-25 NOTE — H&P (View-Only) (Signed)
TOTAL HIP ADMISSION H&P  Patient is admitted for right total hip arthroplasty.  Subjective:  Chief Complaint: right hip pain  HPI: Michelle Molina, 57 y.o. female, has a history of pain and functional disability in the right hip(s) due to arthritis and patient has failed non-surgical conservative treatments for greater than 12 weeks to include NSAID's and/or analgesics, corticosteriod injections, flexibility and strengthening excercises, and activity modification.  Onset of symptoms was gradual starting 10 years ago with rapidlly worsening course since that time.The patient noted no past surgery on the right hip(s).  Patient currently rates pain in the right hip at 10 out of 10 with activity. Patient has night pain, worsening of pain with activity and weight bearing, trendelenberg gait, pain that interfers with activities of daily living, and pain with passive range of motion. Patient has evidence of subchondral cysts, subchondral sclerosis, periarticular osteophytes, and joint space narrowing by imaging studies. This condition presents safety issues increasing the risk of falls. There is no current active infection.  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/15/2017   Routine general medical examination at a health care facility 09/10/2017   Anemia, iron deficiency 06/17/2013   Status post gastric bypass for obesity 06/16/2013   Depressed 11/28/2011   Past Medical History:  Diagnosis Date   Allergy    Anemia    GERD (gastroesophageal reflux disease)    Hiatal hernia    Migraines    PONV (postoperative nausea and vomiting)    Sickle cell trait (HCC)    Ulcer     Past Surgical History:  Procedure Laterality Date   CESAREAN SECTION  1997, 1999   x2   CHOLECYSTECTOMY  1997   COLONOSCOPY     GASTRIC BYPASS  2013   HERNIA REPAIR     hiatal hernia   HYSTEROSCOPY  2006   KNEE SURGERY  2001   left    MYOMECTOMY  2010   SALPINGECTOMY Left    SMALL INTESTINE SURGERY     UPPER GI ENDOSCOPY     "several"    Current Outpatient Medications  Medication Sig Dispense Refill Last Dose   acetaminophen (TYLENOL) 500 MG tablet Take 1,000 mg by mouth every 6 (six) hours as needed for mild pain.      Ascorbic Acid (VITAMIN C) 1000 MG tablet Take 1,000 mg by mouth daily.      calcium citrate (CALCITRATE - DOSED IN MG ELEMENTAL CALCIUM) 950 (200 Ca) MG tablet Take 200 mg of elemental calcium by mouth daily.      hydrochlorothiazide (HYDRODIURIL) 25 MG tablet Take 25 mg by mouth daily.      meloxicam (MOBIC) 7.5 MG tablet Take 7.5 mg by mouth daily in the afternoon.      No current facility-administered medications for this visit.   Allergies  Allergen Reactions   Ibuprofen Other (See Comments)    Had gastric bypass Due to gastric bypass surgery  ulcer    Aspirin Nausea Only and Other (See Comments)   Percocet [Oxycodone-Acetaminophen] Itching   Tramadol Hcl Nausea Only    Dizziness    Social History   Tobacco Use   Smoking status: Never   Smokeless tobacco: Never  Substance Use Topics   Alcohol use: Not Currently    Comment: "rarely"    Family History  Problem Relation Age of Onset   Uterine cancer Mother    Hypertension   Mother    Hyperlipidemia Mother    Other Mother        sarcoma   Breast cancer Maternal Aunt        x3 maternal aunts   Lung cancer Maternal Uncle    Diabetes Maternal Grandfather    Hyperlipidemia Maternal Grandfather    Stroke Maternal Grandfather    Breast cancer Cousin      Review of Systems  Musculoskeletal:  Positive for arthralgias and gait problem.  All other systems reviewed and are negative.   Objective:  Physical Exam Constitutional:      Appearance: Normal appearance.  HENT:     Head: Normocephalic and atraumatic.     Nose: Nose normal.     Mouth/Throat:     Mouth: Mucous membranes are moist.     Pharynx: Oropharynx is clear.   Eyes:     Conjunctiva/sclera: Conjunctivae normal.  Cardiovascular:     Rate and Rhythm: Normal rate and regular rhythm.     Pulses: Normal pulses.     Heart sounds: Normal heart sounds.  Pulmonary:     Effort: Pulmonary effort is normal.  Abdominal:     General: Abdomen is flat.     Palpations: Abdomen is soft.  Genitourinary:    Comments: deferred Musculoskeletal:     Cervical back: Normal range of motion and neck supple.     Comments: Examination of the right hip reveals no skin wounds or lesions. Pain with flexion and rotation of the hip. Pain with palpation over the trochanteric region. Hip abductor strength 4/5.   Sensory and motor function intact in LE bilaterally. Distal pedal pulses 2+ bilaterally.   No significant pedal edema. Calves soft and non-tender.   Skin:    General: Skin is warm and dry.     Capillary Refill: Capillary refill takes less than 2 seconds.  Neurological:     General: No focal deficit present.     Mental Status: She is alert and oriented to person, place, and time.  Psychiatric:        Mood and Affect: Mood normal.        Behavior: Behavior normal.        Thought Content: Thought content normal.        Judgment: Judgment normal.     Vital signs in last 24 hours: @VSRANGES@  Labs:   Estimated body mass index is 29.85 kg/m as calculated from the following:   Height as of 11/16/22: 5' 4.75" (1.645 m).   Weight as of 11/16/22: 80.7 kg.   Imaging Review Plain radiographs demonstrate severe degenerative joint disease of the right hip(s). The bone quality appears to be adequate for age and reported activity level.      Assessment/Plan:  End stage arthritis, right hip(s)  The patient history, physical examination, clinical judgement of the provider and imaging studies are consistent with end stage degenerative joint disease of the right hip(s) and total hip arthroplasty is deemed medically necessary. The treatment options including  medical management, injection therapy, arthroscopy and arthroplasty were discussed at length. The risks and benefits of total hip arthroplasty were presented and reviewed. The risks due to aseptic loosening, infection, stiffness, dislocation/subluxation,  thromboembolic complications and other imponderables were discussed.  The patient acknowledged the explanation, agreed to proceed with the plan and consent was signed. Patient is being admitted for inpatient treatment for surgery, pain control, PT, OT, prophylactic antibiotics, VTE prophylaxis, progressive ambulation and ADL's and discharge planning.The patient is planning to   be discharged home with HEP after an overnight stay.   Therapy Plans: HEP.  Disposition: Home with boyfriend Planned DVT Prophylaxis: Eliquis 2.5mg BID. Aspirin intolerance.  DME needed: walker.  PCP: Cleared. TXA: IV Allergies:  - Aspirin - nausea - Ibuprofen - due to gastric bypass surgery - Percocet - itching.  - Tramadol - chest pains Anesthesia Concerns: None.  BMI: 30.4 Last HgbA1c: 6.2 Other: - History of gastric bypass.  - Hx of hypoglycemia due to diet, vegetarian. - Hydrocodone, zofran, methocarbamol.  - 11/05/22: Hgb 12.3 K+ 5.2, Cr. 0.78, albumin 3.7     Patient's anticipated LOS is less than 2 midnights, meeting these requirements: - Younger than 65 - Lives within 1 hour of care - Has a competent adult at home to recover with post-op recover - NO history of  - Chronic pain requiring opiods  - Diabetes  - Coronary Artery Disease  - Heart failure  - Heart attack  - Stroke  - DVT/VTE  - Cardiac arrhythmia  - Respiratory Failure/COPD  - Renal failure  - Anemia  - Advanced Liver disease   

## 2022-11-25 NOTE — H&P (Signed)
TOTAL HIP ADMISSION H&P  Patient is admitted for right total hip arthroplasty.  Subjective:  Chief Complaint: right hip pain  HPI: Michelle Molina, 57 y.o. female, has a history of pain and functional disability in the right hip(s) due to arthritis and patient has failed non-surgical conservative treatments for greater than 12 weeks to include NSAID's and/or analgesics, corticosteriod injections, flexibility and strengthening excercises, and activity modification.  Onset of symptoms was gradual starting 10 years ago with rapidlly worsening course since that time.The patient noted no past surgery on the right hip(s).  Patient currently rates pain in the right hip at 10 out of 10 with activity. Patient has night pain, worsening of pain with activity and weight bearing, trendelenberg gait, pain that interfers with activities of daily living, and pain with passive range of motion. Patient has evidence of subchondral cysts, subchondral sclerosis, periarticular osteophytes, and joint space narrowing by imaging studies. This condition presents safety issues increasing the risk of falls. There is no current active infection.  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/15/2017   Routine general medical examination at a health care facility 09/10/2017   Anemia, iron deficiency 06/17/2013   Status post gastric bypass for obesity 06/16/2013   Depressed 11/28/2011   Past Medical History:  Diagnosis Date   Allergy    Anemia    GERD (gastroesophageal reflux disease)    Hiatal hernia    Migraines    PONV (postoperative nausea and vomiting)    Sickle cell trait (HCC)    Ulcer     Past Surgical History:  Procedure Laterality Date   CESAREAN SECTION  1997, 1999   x2   CHOLECYSTECTOMY  1997   COLONOSCOPY     GASTRIC BYPASS  2013   HERNIA REPAIR     hiatal hernia   HYSTEROSCOPY  2006   KNEE SURGERY  2001   left    MYOMECTOMY  2010   SALPINGECTOMY Left    SMALL INTESTINE SURGERY     UPPER GI ENDOSCOPY     "several"    Current Outpatient Medications  Medication Sig Dispense Refill Last Dose   acetaminophen (TYLENOL) 500 MG tablet Take 1,000 mg by mouth every 6 (six) hours as needed for mild pain.      Ascorbic Acid (VITAMIN C) 1000 MG tablet Take 1,000 mg by mouth daily.      calcium citrate (CALCITRATE - DOSED IN MG ELEMENTAL CALCIUM) 950 (200 Ca) MG tablet Take 200 mg of elemental calcium by mouth daily.      hydrochlorothiazide (HYDRODIURIL) 25 MG tablet Take 25 mg by mouth daily.      meloxicam (MOBIC) 7.5 MG tablet Take 7.5 mg by mouth daily in the afternoon.      No current facility-administered medications for this visit.   Allergies  Allergen Reactions   Ibuprofen Other (See Comments)    Had gastric bypass Due to gastric bypass surgery  ulcer    Aspirin Nausea Only and Other (See Comments)   Percocet [Oxycodone-Acetaminophen] Itching   Tramadol Hcl Nausea Only    Dizziness    Social History   Tobacco Use   Smoking status: Never   Smokeless tobacco: Never  Substance Use Topics   Alcohol use: Not Currently    Comment: "rarely"    Family History  Problem Relation Age of Onset   Uterine cancer Mother    Hypertension  Mother    Hyperlipidemia Mother    Other Mother        sarcoma   Breast cancer Maternal Aunt        x3 maternal aunts   Lung cancer Maternal Uncle    Diabetes Maternal Grandfather    Hyperlipidemia Maternal Grandfather    Stroke Maternal Grandfather    Breast cancer Cousin      Review of Systems  Musculoskeletal:  Positive for arthralgias and gait problem.  All other systems reviewed and are negative.   Objective:  Physical Exam Constitutional:      Appearance: Normal appearance.  HENT:     Head: Normocephalic and atraumatic.     Nose: Nose normal.     Mouth/Throat:     Mouth: Mucous membranes are moist.     Pharynx: Oropharynx is clear.   Eyes:     Conjunctiva/sclera: Conjunctivae normal.  Cardiovascular:     Rate and Rhythm: Normal rate and regular rhythm.     Pulses: Normal pulses.     Heart sounds: Normal heart sounds.  Pulmonary:     Effort: Pulmonary effort is normal.  Abdominal:     General: Abdomen is flat.     Palpations: Abdomen is soft.  Genitourinary:    Comments: deferred Musculoskeletal:     Cervical back: Normal range of motion and neck supple.     Comments: Examination of the right hip reveals no skin wounds or lesions. Pain with flexion and rotation of the hip. Pain with palpation over the trochanteric region. Hip abductor strength 4/5.   Sensory and motor function intact in LE bilaterally. Distal pedal pulses 2+ bilaterally.   No significant pedal edema. Calves soft and non-tender.   Skin:    General: Skin is warm and dry.     Capillary Refill: Capillary refill takes less than 2 seconds.  Neurological:     General: No focal deficit present.     Mental Status: She is alert and oriented to person, place, and time.  Psychiatric:        Mood and Affect: Mood normal.        Behavior: Behavior normal.        Thought Content: Thought content normal.        Judgment: Judgment normal.     Vital signs in last 24 hours: @VSRANGES @  Labs:   Estimated body mass index is 29.85 kg/m as calculated from the following:   Height as of 11/16/22: 5' 4.75" (1.645 m).   Weight as of 11/16/22: 80.7 kg.   Imaging Review Plain radiographs demonstrate severe degenerative joint disease of the right hip(s). The bone quality appears to be adequate for age and reported activity level.      Assessment/Plan:  End stage arthritis, right hip(s)  The patient history, physical examination, clinical judgement of the provider and imaging studies are consistent with end stage degenerative joint disease of the right hip(s) and total hip arthroplasty is deemed medically necessary. The treatment options including  medical management, injection therapy, arthroscopy and arthroplasty were discussed at length. The risks and benefits of total hip arthroplasty were presented and reviewed. The risks due to aseptic loosening, infection, stiffness, dislocation/subluxation,  thromboembolic complications and other imponderables were discussed.  The patient acknowledged the explanation, agreed to proceed with the plan and consent was signed. Patient is being admitted for inpatient treatment for surgery, pain control, PT, OT, prophylactic antibiotics, VTE prophylaxis, progressive ambulation and ADL's and discharge planning.The patient is planning to  be discharged home with HEP after an overnight stay.   Therapy Plans: HEP.  Disposition: Home with boyfriend Planned DVT Prophylaxis: Eliquis 2.5mg  BID. Aspirin intolerance.  DME needed: walker.  PCP: Cleared. TXA: IV Allergies:  - Aspirin - nausea - Ibuprofen - due to gastric bypass surgery - Percocet - itching.  - Tramadol - chest pains Anesthesia Concerns: None.  BMI: 30.4 Last HgbA1c: 6.2 Other: - History of gastric bypass.  - Hx of hypoglycemia due to diet, vegetarian. - Hydrocodone, zofran, methocarbamol.  - 11/05/22: Hgb 12.3 K+ 5.2, Cr. 0.78, albumin 3.7     Patient's anticipated LOS is less than 2 midnights, meeting these requirements: - Younger than 32 - Lives within 1 hour of care - Has a competent adult at home to recover with post-op recover - NO history of  - Chronic pain requiring opiods  - Diabetes  - Coronary Artery Disease  - Heart failure  - Heart attack  - Stroke  - DVT/VTE  - Cardiac arrhythmia  - Respiratory Failure/COPD  - Renal failure  - Anemia  - Advanced Liver disease

## 2022-11-28 ENCOUNTER — Ambulatory Visit (HOSPITAL_BASED_OUTPATIENT_CLINIC_OR_DEPARTMENT_OTHER): Payer: 59 | Admitting: Certified Registered"

## 2022-11-28 ENCOUNTER — Ambulatory Visit (HOSPITAL_COMMUNITY): Payer: 59

## 2022-11-28 ENCOUNTER — Encounter (HOSPITAL_COMMUNITY)
Admission: RE | Disposition: A | Discharge status: Home / Self Care | Payer: Self-pay | Source: Ambulatory Visit | Attending: Orthopedic Surgery

## 2022-11-28 ENCOUNTER — Observation Stay (HOSPITAL_COMMUNITY)
Admission: RE | Admit: 2022-11-28 | Discharge: 2022-11-30 | Disposition: A | Discharge status: Home / Self Care | Payer: 59 | Source: Ambulatory Visit | Attending: Orthopedic Surgery | Admitting: Orthopedic Surgery

## 2022-11-28 ENCOUNTER — Encounter (HOSPITAL_COMMUNITY): Payer: Self-pay | Admitting: Orthopedic Surgery

## 2022-11-28 ENCOUNTER — Ambulatory Visit (HOSPITAL_COMMUNITY): Payer: 59 | Admitting: Medical

## 2022-11-28 ENCOUNTER — Other Ambulatory Visit: Payer: Self-pay

## 2022-11-28 DIAGNOSIS — K449 Diaphragmatic hernia without obstruction or gangrene: Secondary | ICD-10-CM

## 2022-11-28 DIAGNOSIS — G248 Other dystonia: Secondary | ICD-10-CM | POA: Diagnosis not present

## 2022-11-28 DIAGNOSIS — D649 Anemia, unspecified: Secondary | ICD-10-CM

## 2022-11-28 DIAGNOSIS — M1611 Unilateral primary osteoarthritis, right hip: Secondary | ICD-10-CM | POA: Diagnosis present

## 2022-11-28 DIAGNOSIS — Z79899 Other long term (current) drug therapy: Secondary | ICD-10-CM | POA: Diagnosis not present

## 2022-11-28 DIAGNOSIS — Z96641 Presence of right artificial hip joint: Secondary | ICD-10-CM

## 2022-11-28 HISTORY — PX: TOTAL HIP ARTHROPLASTY: SHX124

## 2022-11-28 LAB — ABO/RH: ABO/RH(D): O POS

## 2022-11-28 SURGERY — ARTHROPLASTY, HIP, TOTAL, ANTERIOR APPROACH
Anesthesia: Spinal | Site: Hip | Laterality: Right

## 2022-11-28 MED ORDER — ISOPROPYL ALCOHOL 70 % SOLN
Status: DC | PRN
  Administered 2022-11-28: 1 via TOPICAL

## 2022-11-28 MED ORDER — METOCLOPRAMIDE HCL 5 MG PO TABS
5.0000 mg | ORAL_TABLET | Freq: Three times a day (TID) | ORAL | Status: DC | PRN
  Filled 2022-11-28: qty 1

## 2022-11-28 MED ORDER — SENNA 8.6 MG PO TABS
1.0000 | ORAL_TABLET | Freq: Two times a day (BID) | ORAL | Status: DC
  Administered 2022-11-28 – 2022-11-30 (×4): 8.6 mg via ORAL
  Filled 2022-11-28 (×4): qty 1

## 2022-11-28 MED ORDER — PHENYLEPHRINE 80 MCG/ML (10ML) SYRINGE FOR IV PUSH (FOR BLOOD PRESSURE SUPPORT)
PREFILLED_SYRINGE | INTRAVENOUS | Status: DC | PRN
  Administered 2022-11-28: 80 ug via INTRAVENOUS

## 2022-11-28 MED ORDER — DOCUSATE SODIUM 100 MG PO CAPS
100.0000 mg | ORAL_CAPSULE | Freq: Two times a day (BID) | ORAL | Status: DC
  Administered 2022-11-28 – 2022-11-30 (×4): 100 mg via ORAL
  Filled 2022-11-28 (×4): qty 1

## 2022-11-28 MED ORDER — HYDROCODONE-ACETAMINOPHEN 7.5-325 MG PO TABS
1.0000 | ORAL_TABLET | ORAL | Status: DC | PRN
  Administered 2022-11-28 – 2022-11-29 (×2): 1 via ORAL
  Filled 2022-11-28 (×4): qty 1

## 2022-11-28 MED ORDER — METOCLOPRAMIDE HCL 5 MG/ML IJ SOLN: 5.0000 mg | Freq: Three times a day (TID) | INTRAMUSCULAR | Status: DC | PRN

## 2022-11-28 MED ORDER — PROPOFOL 10 MG/ML IV BOLUS
INTRAVENOUS | Status: DC | PRN
  Administered 2022-11-28: 30 mg via INTRAVENOUS
  Administered 2022-11-28: 20 mg via INTRAVENOUS

## 2022-11-28 MED ORDER — DEXMEDETOMIDINE HCL IN NACL 80 MCG/20ML IV SOLN
INTRAVENOUS | Status: DC | PRN
  Administered 2022-11-28: 12 ug via INTRAVENOUS
  Administered 2022-11-28: 8 ug via INTRAVENOUS

## 2022-11-28 MED ORDER — CEFAZOLIN SODIUM-DEXTROSE 2-4 GM/100ML-% IV SOLN
2.0000 g | INTRAVENOUS | Status: AC
  Administered 2022-11-28: 2 g via INTRAVENOUS
  Filled 2022-11-28: qty 100

## 2022-11-28 MED ORDER — ACETAMINOPHEN 500 MG PO TABS
1000.0000 mg | ORAL_TABLET | Freq: Once | ORAL | Status: AC
  Administered 2022-11-28: 1000 mg via ORAL
  Filled 2022-11-28: qty 2

## 2022-11-28 MED ORDER — VITAMIN C 500 MG PO TABS
1000.0000 mg | ORAL_TABLET | Freq: Every day | ORAL | Status: DC
  Administered 2022-11-29 – 2022-11-30 (×2): 1000 mg via ORAL
  Filled 2022-11-28 (×2): qty 2

## 2022-11-28 MED ORDER — PROPOFOL 500 MG/50ML IV EMUL
INTRAVENOUS | Status: DC | PRN
  Administered 2022-11-28: 100 ug/kg/min via INTRAVENOUS

## 2022-11-28 MED ORDER — ORAL CARE MOUTH RINSE: 15.0000 mL | Freq: Once | OROMUCOSAL | Status: AC

## 2022-11-28 MED ORDER — LACTATED RINGERS IV SOLN: INTRAVENOUS | Status: DC

## 2022-11-28 MED ORDER — PHENYLEPHRINE 80 MCG/ML (10ML) SYRINGE FOR IV PUSH (FOR BLOOD PRESSURE SUPPORT)
PREFILLED_SYRINGE | INTRAVENOUS | Status: AC
  Filled 2022-11-28: qty 10

## 2022-11-28 MED ORDER — PHENYLEPHRINE HCL-NACL 20-0.9 MG/250ML-% IV SOLN
INTRAVENOUS | Status: DC | PRN
  Administered 2022-11-28: 40 ug/min via INTRAVENOUS

## 2022-11-28 MED ORDER — APIXABAN 2.5 MG PO TABS
2.5000 mg | ORAL_TABLET | Freq: Two times a day (BID) | ORAL | Status: DC
  Administered 2022-11-29 – 2022-11-30 (×3): 2.5 mg via ORAL
  Filled 2022-11-28 (×3): qty 1

## 2022-11-28 MED ORDER — CHLORHEXIDINE GLUCONATE 0.12 % MT SOLN
15.0000 mL | Freq: Once | OROMUCOSAL | Status: AC
  Administered 2022-11-28: 15 mL via OROMUCOSAL

## 2022-11-28 MED ORDER — MIDAZOLAM HCL 2 MG/2ML IJ SOLN
INTRAMUSCULAR | Status: AC
  Filled 2022-11-28: qty 2

## 2022-11-28 MED ORDER — METHOCARBAMOL 500 MG PO TABS
500.0000 mg | ORAL_TABLET | Freq: Four times a day (QID) | ORAL | Status: DC | PRN
  Administered 2022-11-29: 500 mg via ORAL
  Filled 2022-11-28: qty 1

## 2022-11-28 MED ORDER — ONDANSETRON HCL 4 MG/2ML IJ SOLN
INTRAMUSCULAR | Status: AC
  Filled 2022-11-28: qty 2

## 2022-11-28 MED ORDER — BUPIVACAINE IN DEXTROSE 0.75-8.25 % IT SOLN
INTRATHECAL | Status: DC | PRN
  Administered 2022-11-28: 1.6 mL via INTRATHECAL

## 2022-11-28 MED ORDER — METHOCARBAMOL 1000 MG/10ML IJ SOLN: 500.0000 mg | Freq: Four times a day (QID) | INTRAVENOUS | Status: DC | PRN

## 2022-11-28 MED ORDER — POLYETHYLENE GLYCOL 3350 17 G PO PACK: 17.0000 g | PACK | Freq: Every day | ORAL | Status: DC | PRN

## 2022-11-28 MED ORDER — HYDROCODONE-ACETAMINOPHEN 5-325 MG PO TABS
1.0000 | ORAL_TABLET | ORAL | Status: DC | PRN
  Administered 2022-11-29 – 2022-11-30 (×5): 2 via ORAL
  Filled 2022-11-28 (×5): qty 2

## 2022-11-28 MED ORDER — MORPHINE SULFATE (PF) 2 MG/ML IV SOLN
0.5000 mg | INTRAVENOUS | Status: DC | PRN
  Administered 2022-11-28 – 2022-11-29 (×2): 1 mg via INTRAVENOUS
  Filled 2022-11-28 (×2): qty 1

## 2022-11-28 MED ORDER — DIPHENHYDRAMINE HCL 12.5 MG/5ML PO ELIX: 12.5000 mg | ORAL_SOLUTION | ORAL | Status: DC | PRN

## 2022-11-28 MED ORDER — SODIUM CHLORIDE (PF) 0.9 % IJ SOLN
INTRAMUSCULAR | Status: AC
  Filled 2022-11-28: qty 30

## 2022-11-28 MED ORDER — SODIUM CHLORIDE (PF) 0.9 % IJ SOLN
INTRAMUSCULAR | Status: DC | PRN
  Administered 2022-11-28: 30 mL

## 2022-11-28 MED ORDER — PHENOL 1.4 % MT LIQD: 1.0000 | OROMUCOSAL | Status: DC | PRN

## 2022-11-28 MED ORDER — CEFAZOLIN SODIUM-DEXTROSE 2-4 GM/100ML-% IV SOLN
2.0000 g | Freq: Four times a day (QID) | INTRAVENOUS | Status: AC
  Administered 2022-11-28 – 2022-11-29 (×2): 2 g via INTRAVENOUS
  Filled 2022-11-28 (×2): qty 100

## 2022-11-28 MED ORDER — ALUM & MAG HYDROXIDE-SIMETH 200-200-20 MG/5ML PO SUSP: 30.0000 mL | ORAL | Status: DC | PRN

## 2022-11-28 MED ORDER — TRANEXAMIC ACID-NACL 1000-0.7 MG/100ML-% IV SOLN
1000.0000 mg | INTRAVENOUS | Status: AC
  Administered 2022-11-28: 1000 mg via INTRAVENOUS
  Filled 2022-11-28: qty 100

## 2022-11-28 MED ORDER — MIDAZOLAM HCL 2 MG/2ML IJ SOLN
INTRAMUSCULAR | Status: DC | PRN
  Administered 2022-11-28: 2 mg via INTRAVENOUS

## 2022-11-28 MED ORDER — PROPOFOL 1000 MG/100ML IV EMUL
INTRAVENOUS | Status: AC
  Filled 2022-11-28: qty 100

## 2022-11-28 MED ORDER — AMISULPRIDE (ANTIEMETIC) 5 MG/2ML IV SOLN: 10.0000 mg | Freq: Once | INTRAVENOUS | Status: DC | PRN

## 2022-11-28 MED ORDER — POVIDONE-IODINE 10 % EX SWAB: 2.0000 | Freq: Once | CUTANEOUS | Status: DC

## 2022-11-28 MED ORDER — MENTHOL 3 MG MT LOZG: 1.0000 | LOZENGE | OROMUCOSAL | Status: DC | PRN

## 2022-11-28 MED ORDER — CALCIUM CITRATE 950 (200 CA) MG PO TABS
200.0000 mg | ORAL_TABLET | Freq: Every day | ORAL | Status: DC
  Administered 2022-11-29 – 2022-11-30 (×2): 200 mg via ORAL
  Filled 2022-11-28 (×2): qty 1

## 2022-11-28 MED ORDER — KETOROLAC TROMETHAMINE 30 MG/ML IJ SOLN
INTRAMUSCULAR | Status: DC | PRN
  Administered 2022-11-28: 30 mg via INTRAMUSCULAR

## 2022-11-28 MED ORDER — ONDANSETRON HCL 4 MG/2ML IJ SOLN
4.0000 mg | Freq: Four times a day (QID) | INTRAMUSCULAR | Status: DC | PRN
  Administered 2022-11-28: 4 mg via INTRAVENOUS
  Filled 2022-11-28: qty 2

## 2022-11-28 MED ORDER — BUPIVACAINE HCL (PF) 0.25 % IJ SOLN
INTRAMUSCULAR | Status: AC
  Filled 2022-11-28: qty 30

## 2022-11-28 MED ORDER — SODIUM CHLORIDE 0.9 % IV SOLN: INTRAVENOUS | Status: DC

## 2022-11-28 MED ORDER — ACETAMINOPHEN 500 MG PO TABS
500.0000 mg | ORAL_TABLET | Freq: Four times a day (QID) | ORAL | Status: AC
  Filled 2022-11-28: qty 1

## 2022-11-28 MED ORDER — SODIUM CHLORIDE 0.9 % IR SOLN
Status: DC | PRN
  Administered 2022-11-28: 1000 mL
  Administered 2022-11-28: 3000 mL

## 2022-11-28 MED ORDER — KETOROLAC TROMETHAMINE 30 MG/ML IJ SOLN
INTRAMUSCULAR | Status: AC
  Filled 2022-11-28: qty 1

## 2022-11-28 MED ORDER — HYDROMORPHONE HCL 1 MG/ML IJ SOLN: 0.2500 mg | INTRAMUSCULAR | Status: DC | PRN

## 2022-11-28 MED ORDER — ONDANSETRON HCL 4 MG PO TABS
4.0000 mg | ORAL_TABLET | Freq: Four times a day (QID) | ORAL | Status: DC | PRN
  Administered 2022-11-29 – 2022-11-30 (×6): 4 mg via ORAL
  Filled 2022-11-28 (×6): qty 1

## 2022-11-28 MED ORDER — BUPIVACAINE HCL (PF) 0.25 % IJ SOLN
INTRAMUSCULAR | Status: DC | PRN
  Administered 2022-11-28: 30 mL

## 2022-11-28 MED ORDER — ACETAMINOPHEN 325 MG PO TABS: 325.0000 mg | ORAL_TABLET | Freq: Four times a day (QID) | ORAL | Status: DC | PRN

## 2022-11-28 SURGICAL SUPPLY — 58 items
ACE SHELL 3H 52 E HIP (Shell) ×1 IMPLANT
ADH SKN CLS APL DERMABOND .7 (GAUZE/BANDAGES/DRESSINGS) ×1
APL PRP STRL LF DISP 70% ISPRP (MISCELLANEOUS) ×1
BAG COUNTER SPONGE SURGICOUNT (BAG) IMPLANT
BAG DECANTER FOR FLEXI CONT (MISCELLANEOUS) IMPLANT
BAG SPEC THK2 15X12 ZIP CLS (MISCELLANEOUS)
BAG SPNG CNTER NS LX DISP (BAG)
BAG ZIPLOCK 12X15 (MISCELLANEOUS) IMPLANT
CHLORAPREP W/TINT 26 (MISCELLANEOUS) ×1 IMPLANT
COVER PERINEAL POST (MISCELLANEOUS) ×1 IMPLANT
COVER SURGICAL LIGHT HANDLE (MISCELLANEOUS) ×1 IMPLANT
DERMABOND ADVANCED .7 DNX12 (GAUZE/BANDAGES/DRESSINGS) ×2 IMPLANT
DRAPE IMP U-DRAPE 54X76 (DRAPES) ×1 IMPLANT
DRAPE SHEET LG 3/4 BI-LAMINATE (DRAPES) ×3 IMPLANT
DRAPE STERI IOBAN 125X83 (DRAPES) ×1 IMPLANT
DRAPE U-SHAPE 47X51 STRL (DRAPES) ×1 IMPLANT
DRSG AQUACEL AG ADV 3.5X10 (GAUZE/BANDAGES/DRESSINGS) ×1 IMPLANT
ELECT REM PT RETURN 15FT ADLT (MISCELLANEOUS) ×1 IMPLANT
GAUZE SPONGE 4X4 12PLY STRL (GAUZE/BANDAGES/DRESSINGS) ×1 IMPLANT
GLOVE BIO SURGEON STRL SZ7 (GLOVE) ×1 IMPLANT
GLOVE BIO SURGEON STRL SZ8.5 (GLOVE) ×2 IMPLANT
GLOVE BIOGEL PI IND STRL 7.5 (GLOVE) ×1 IMPLANT
GLOVE BIOGEL PI IND STRL 8.5 (GLOVE) ×1 IMPLANT
GOWN SPEC L3 XXLG W/TWL (GOWN DISPOSABLE) ×1 IMPLANT
GOWN STRL REUS W/ TWL XL LVL3 (GOWN DISPOSABLE) ×1 IMPLANT
GOWN STRL REUS W/TWL XL LVL3 (GOWN DISPOSABLE) ×1
HANDPIECE INTERPULSE COAX TIP (DISPOSABLE) ×1
HEAD CERAMIC BIOLOX 36MM (Head) IMPLANT
HIP SLEEVE BIOLOX -6MM OFFSET (Sleeve) ×1 IMPLANT
HOLDER FOLEY CATH W/STRAP (MISCELLANEOUS) ×1 IMPLANT
HOOD PEEL AWAY T7 (MISCELLANEOUS) ×3 IMPLANT
KIT TURNOVER KIT A (KITS) IMPLANT
LINER ACE G7 HIGH 36 SZ E (Liner) IMPLANT
MANIFOLD NEPTUNE II (INSTRUMENTS) ×1 IMPLANT
MARKER SKIN DUAL TIP RULER LAB (MISCELLANEOUS) ×1 IMPLANT
NDL SAFETY ECLIP 18X1.5 (MISCELLANEOUS) ×1 IMPLANT
NDL SPNL 18GX3.5 QUINCKE PK (NEEDLE) ×1 IMPLANT
NEEDLE SPNL 18GX3.5 QUINCKE PK (NEEDLE) ×1 IMPLANT
PACK ANTERIOR HIP CUSTOM (KITS) ×1 IMPLANT
SAW OSC TIP CART 19.5X105X1.3 (SAW) ×1 IMPLANT
SEALER BIPOLAR AQUA 6.0 (INSTRUMENTS) ×1 IMPLANT
SET HNDPC FAN SPRY TIP SCT (DISPOSABLE) ×1 IMPLANT
SHELL ACETAB 3H 52 E HIP (Shell) IMPLANT
SLEEVE HIP BIOLOX -6MM OFFSET (Sleeve) IMPLANT
SOLUTION PRONTOSAN WOUND 350ML (IRRIGATION / IRRIGATOR) ×1 IMPLANT
SPIKE FLUID TRANSFER (MISCELLANEOUS) ×1 IMPLANT
STEM FEM REDUCE DISTAL 11X107 (Stem) IMPLANT
SUT MNCRL AB 3-0 PS2 18 (SUTURE) ×1 IMPLANT
SUT MON AB 2-0 CT1 36 (SUTURE) ×1 IMPLANT
SUT STRATAFIX PDO 1 14 VIOLET (SUTURE) ×1
SUT STRATFX PDO 1 14 VIOLET (SUTURE) ×1
SUT VIC AB 2-0 CT1 27 (SUTURE)
SUT VIC AB 2-0 CT1 TAPERPNT 27 (SUTURE) IMPLANT
SUTURE STRATFX PDO 1 14 VIOLET (SUTURE) ×1 IMPLANT
SYR 3ML LL SCALE MARK (SYRINGE) ×1 IMPLANT
TRAY FOLEY MTR SLVR 16FR STAT (SET/KITS/TRAYS/PACK) IMPLANT
TUBE SUCTION HIGH CAP CLEAR NV (SUCTIONS) ×1 IMPLANT
WATER STERILE IRR 1000ML POUR (IV SOLUTION) ×1 IMPLANT

## 2022-11-28 NOTE — Transfer of Care (Signed)
Immediate Anesthesia Transfer of Care Note  Patient: Michelle Molina  Procedure(s) Performed: TOTAL HIP ARTHROPLASTY ANTERIOR APPROACH (Right: Hip)  Patient Location: PACU  Anesthesia Type:MAC and Spinal  Level of Consciousness: oriented, sedated, and patient cooperative  Airway & Oxygen Therapy: Patient Spontanous Breathing and Patient connected to face mask oxygen  Post-op Assessment: Report given to RN and Post -op Vital signs reviewed and stable  Post vital signs: Reviewed  Last Vitals:  Vitals Value Taken Time  BP 115/87 11/28/22 1557  Temp    Pulse 53 11/28/22 1558  Resp 14 11/28/22 1558  SpO2 100 % 11/28/22 1558  Vitals shown include unvalidated device data.  Last Pain:  Vitals:   11/28/22 1207  TempSrc:   PainSc: 7          Complications: No notable events documented.

## 2022-11-28 NOTE — Interval H&P Note (Signed)
History and Physical Interval Note:  11/28/2022 1:51 PM  Michelle Molina  has presented today for surgery, with the diagnosis of Right hip osteoarthritis.  The various methods of treatment have been discussed with the patient and family. After consideration of risks, benefits and other options for treatment, the patient has consented to  Procedure(s) with comments: TOTAL HIP ARTHROPLASTY ANTERIOR APPROACH (Right) - 150 as a surgical intervention.  The patient's history has been reviewed, patient examined, no change in status, stable for surgery.  I have reviewed the patient's chart and labs.  Questions were answered to the patient's satisfaction.     Iline Oven Deandrea Vanpelt

## 2022-11-28 NOTE — Discharge Instructions (Addendum)
 Dr. Brian Swinteck Joint Replacement Specialist [facility] [address] [place], [place] [address] [phone]   TOTAL HIP REPLACEMENT POSTOPERATIVE DIRECTIONS    Hip Rehabilitation, Guidelines Following Surgery   WEIGHT BEARING Weight bearing as tolerated with assist device (walker, cane, etc) as directed, use it as long as suggested by your surgeon or therapist, typically at least 4-6 weeks.  The results of a hip operation are greatly improved after range of motion and muscle strengthening exercises. Follow all safety measures which are given to protect your hip. If any of these exercises cause increased pain or swelling in your joint, decrease the amount until you are comfortable again. Then slowly increase the exercises. Call your caregiver if you have problems or questions.   HOME CARE INSTRUCTIONS  Most of the following instructions are designed to prevent the dislocation of your new hip.  Remove items at home which could result in a fall. This includes throw rugs or furniture in walking pathways.  Continue medications as instructed at time of discharge. You may have some home medications which will be placed on hold until you complete the course of blood thinner medication. You may start showering once you are discharged home. Do not remove your dressing. Do not put on socks or shoes without following the instructions of your caregivers.   Sit on chairs with arms. Use the chair arms to help push yourself up when arising.  Arrange for the use of a toilet seat elevator so you are not sitting low.  Walk with walker as instructed.  You may resume a sexual relationship in one month or when given the OK by your caregiver.  Use walker as long as suggested by your caregivers.  You may put full weight on your legs and walk as much as is comfortable. Avoid periods of inactivity such as sitting longer than an hour when not asleep. This helps prevent blood  clots.  You may return to work once you are cleared by your surgeon.  Do not drive a car for 6 weeks or until released by your surgeon.  Do not drive while taking narcotics.  Wear elastic stockings for two weeks following surgery during the day but you may remove then at night.  Make sure you keep all of your appointments after your operation with all of your doctors and caregivers. You should call the office at the above phone number and make an appointment for approximately two weeks after the date of your surgery. Please pick up a stool softener and laxative for home use as long as you are requiring pain medications. ICE to the affected hip every three hours for 30 minutes at a time and then as needed for pain and swelling. Continue to use ice on the hip for pain and swelling from surgery. You may notice swelling that will progress down to the foot and ankle.  This is normal after surgery.  Elevate the leg when you are not up walking on it.   It is important for you to complete the blood thinner medication as prescribed by your doctor. Continue to use the breathing machine which will help keep your temperature down.  It is common for your temperature to cycle up and down following surgery, especially at night when you are not up moving around and exerting yourself.  The breathing machine keeps your lungs expanded and your temperature down.  RANGE OF MOTION AND STRENGTHENING EXERCISES  These exercises are designed to help you   keep full movement of your hip joint. Follow your caregiver's or physical therapist's instructions. Perform all exercises about fifteen times, three times per day or as directed. Exercise both hips, even if you have had only one joint replacement. These exercises can be done on a training (exercise) mat, on the floor, on a table or on a bed. Use whatever works the best and is most comfortable for you. Use music or television while you are exercising so that the exercises are a  pleasant break in your day. This will make your life better with the exercises acting as a break in routine you can look forward to.  Lying on your back, slowly slide your foot toward your buttocks, raising your knee up off the floor. Then slowly slide your foot back down until your leg is straight again.  Lying on your back spread your legs as far apart as you can without causing discomfort.  Lying on your side, raise your upper leg and foot straight up from the floor as far as is comfortable. Slowly lower the leg and repeat.  Lying on your back, tighten up the muscle in the front of your thigh (quadriceps muscles). You can do this by keeping your leg straight and trying to raise your heel off the floor. This helps strengthen the largest muscle supporting your knee.  Lying on your back, tighten up the muscles of your buttocks both with the legs straight and with the knee bent at a comfortable angle while keeping your heel on the floor.   SKILLED REHAB INSTRUCTIONS: If the patient is transferred to a skilled rehab facility following release from the hospital, a list of the current medications will be sent to the facility for the patient to continue.  When discharged from the skilled rehab facility, please have the facility set up the patient's Home Health Physical Therapy prior to being released. Also, the skilled facility will be responsible for providing the patient with their medications at time of release from the facility to include their pain medication and their blood thinner medication. If the patient is still at the rehab facility at time of the two week follow up appointment, the skilled rehab facility will also need to assist the patient in arranging follow up appointment in our office and any transportation needs.  POST-OPERATIVE OPIOID TAPER INSTRUCTIONS: It is important to wean off of your opioid medication as soon as possible. If you do not need pain medication after your surgery it is ok  to stop day one. Opioids include: Codeine, Hydrocodone(Norco, Vicodin), Oxycodone(Percocet, oxycontin) and hydromorphone amongst others.  Long term and even short term use of opiods can cause: Increased pain response Dependence Constipation Depression Respiratory depression And more.  Withdrawal symptoms can include Flu like symptoms Nausea, vomiting And more Techniques to manage these symptoms Hydrate well Eat regular healthy meals Stay active Use relaxation techniques(deep breathing, meditating, yoga) Do Not substitute Alcohol to help with tapering If you have been on opioids for less than two weeks and do not have pain than it is ok to stop all together.  Plan to wean off of opioids This plan should start within one week post op of your joint replacement. Maintain the same interval or time between taking each dose and first decrease the dose.  Cut the total daily intake of opioids by one tablet each day Next start to increase the time between doses. The last dose that should be eliminated is the evening dose.    MAKE   SURE YOU:  Understand these instructions.  Will watch your condition.  Will get help right away if you are not doing well or get worse.  Pick up stool softner and laxative for home use following surgery while on pain medications. Do not remove your dressing. The dressing is waterproof--it is OK to take showers. Continue to use ice for pain and swelling after surgery. Do not use any lotions or creams on the incision until instructed by your surgeon. Total Hip Protocol.   Information on my medicine - ELIQUIS (apixaban)   Why was Eliquis prescribed for you? Eliquis was prescribed for you to reduce the risk of blood clots forming after orthopedic surgery.    What do You need to know about Eliquis? Take your Eliquis TWICE DAILY - one tablet in the morning and one tablet in the evening with or without food.  It would be best to take the dose about the  same time each day.  If you have difficulty swallowing the tablet whole please discuss with your pharmacist how to take the medication safely.  Take Eliquis exactly as prescribed by your doctor and DO NOT stop taking Eliquis without talking to the doctor who prescribed the medication.  Stopping without other medication to take the place of Eliquis may increase your risk of developing a clot.  After discharge, you should have regular check-up appointments with your healthcare provider that is prescribing your Eliquis.  What do you do if you miss a dose? If a dose of ELIQUIS is not taken at the scheduled time, take it as soon as possible on the same day and twice-daily administration should be resumed.  The dose should not be doubled to make up for a missed dose.  Do not take more than one tablet of ELIQUIS at the same time.  Important Safety Information A possible side effect of Eliquis is bleeding. You should call your healthcare provider right away if you experience any of the following: Bleeding from an injury or your nose that does not stop. Unusual colored urine (red or dark brown) or unusual colored stools (red or black). Unusual bruising for unknown reasons. A serious fall or if you hit your head (even if there is no bleeding).  Some medicines may interact with Eliquis and might increase your risk of bleeding or clotting while on Eliquis. To help avoid this, consult your healthcare provider or pharmacist prior to using any new prescription or non-prescription medications, including herbals, vitamins, non-steroidal anti-inflammatory drugs (NSAIDs) and supplements.  This website has more information on Eliquis (apixaban): http://www.eliquis.com/eliquis/home   

## 2022-11-28 NOTE — Op Note (Signed)
OPERATIVE REPORT  SURGEON: Samson Frederic, MD   ASSISTANT: Clint Bolder, PA-C.  PREOPERATIVE DIAGNOSIS: Right hip arthritis.   POSTOPERATIVE DIAGNOSIS: Right hip arthritis.   PROCEDURE: Right total hip arthroplasty, anterior approach.   IMPLANTS: Biomet Taperloc Complete Microplasty stem, size 11 x 107.5 mm, high offset. Biomet G7 OsseoTi Cup, size 52 mm. Biomet Vivacit-E liner, size 36 mm, E, neutral. Biomet Biolox ceramic head ball, size 36 - 6 mm.  ANESTHESIA:  MAC and Spinal  ESTIMATED BLOOD LOSS:-350 mL    ANTIBIOTICS: 2 g Ancef.  DRAINS: None.  COMPLICATIONS: None.   CONDITION: PACU - hemodynamically stable.   BRIEF CLINICAL NOTE: Michelle Molina is a 57 y.o. female with a long-standing history of Right hip arthritis. After failing conservative management, the patient was indicated for total hip arthroplasty. The risks, benefits, and alternatives to the procedure were explained, and the patient elected to proceed.  PROCEDURE IN DETAIL: Surgical site was marked by myself in the pre-op holding area. Once inside the operating room, spinal anesthesia was obtained, and a foley catheter was inserted. The patient was then positioned on the Hana table.  All bony prominences were well padded.  The hip was prepped and draped in the normal sterile surgical fashion.  A time-out was called verifying side and site of surgery. The patient received IV antibiotics within 60 minutes of beginning the procedure.   Bikini incision was made, and superficial dissection was performed lateral to the ASIS. The direct anterior approach to the hip was performed through the Hueter interval.  Lateral femoral circumflex vessels were treated with the Auqumantys. The anterior capsule was exposed and an inverted T capsulotomy was made. The femoral neck cut was made to the level of the templated cut.  A corkscrew was placed into the head and the head was removed.  The femoral head was found to have eburnated  bone. The head was passed to the back table and was measured. Pubofemoral ligament was released off of the calcar, taking care to stay on bone. Superior capsule was released from the greater trochanter, taking care to stay lateral to the posterior border of the femoral neck in order to preserve the short external rotators.   Acetabular exposure was achieved, and the pulvinar and labrum were excised. Sequential reaming of the acetabulum was then performed up to a size 51 mm reamer. A 52 mm cup was then opened and impacted into place at approximately 40 degrees of abduction and 20 degrees of anteversion. The final polyethylene liner was impacted into place and acetabular osteophytes were removed.    I then gained femoral exposure taking care to protect the abductors and greater trochanter.  This was performed using standard external rotation, extension, and adduction.  A cookie cutter was used to enter the femoral canal, and then the femoral canal finder was placed.  Sequential broaching was performed up to a size 11.  Calcar planer was used on the femoral neck remnant.  I placed a high offset neck and a trial head ball.  The hip was reduced.  Leg lengths and offset were checked fluoroscopically.  The hip was dislocated and trial components were removed.  The final implants were placed, and the hip was reduced.  Fluoroscopy was used to confirm component position and leg lengths.  At 90 degrees of external rotation and full extension, the hip was stable to an anterior directed force.   The wound was copiously irrigated with Prontosan solution and normal saline using pule lavage.  Marcaine solution was injected into the periarticular soft tissue.  The wound was closed in layers using #1 Vicryl and V-Loc for the fascia, 2-0 Vicryl for the subcutaneous fat, 2-0 Monocryl for the deep dermal layer, 3-0 running Monocryl subcuticular stitch, and Dermabond for the skin.  Once the glue was fully dried, an Aquacell Ag  dressing was applied.  The patient was transported to the recovery room in stable condition.  Sponge, needle, and instrument counts were correct at the end of the case x2.  The patient tolerated the procedure well and there were no known complications.  The aquamantis was utilized for this case to help facilitate better hemostasis as patient was felt to be at increased risk of bleeding because of preop anemia.  A oscillating saw tip was utilized for this case to prevent damage to the soft tissue structures such as muscles, ligaments and tendons, and to ensure accurate bone cuts. This patient was at increased risk for above structures due to  minimally invasive approach.  Please note that a surgical assistant was a medical necessity for this procedure to perform it in a safe and expeditious manner. Assistant was necessary to provide appropriate retraction of vital neurovascular structures, to prevent femoral fracture, and to allow for anatomic placement of the prosthesis.

## 2022-11-28 NOTE — Anesthesia Procedure Notes (Signed)
Spinal  Patient location during procedure: OR Start time: 11/28/2022 1:46 PM End time: 11/28/2022 1:51 PM Reason for block: surgical anesthesia Staffing Performed: anesthesiologist  Anesthesiologist: Mal Amabile, MD Performed by: Mal Amabile, MD Authorized by: Mal Amabile, MD   Preanesthetic Checklist Completed: patient identified, IV checked, site marked, risks and benefits discussed, surgical consent, monitors and equipment checked, pre-op evaluation and timeout performed Spinal Block Patient position: sitting Prep: DuraPrep and site prepped and draped Patient monitoring: heart rate, cardiac monitor, continuous pulse ox and blood pressure Approach: midline Location: L3-4 Injection technique: single-shot Needle Needle type: Pencan  Needle gauge: 24 G Needle length: 9 cm Needle insertion depth: 6 cm Assessment Sensory level: T6 Events: CSF return Additional Notes Patient tolerated procedure well. Adequate sensory level.

## 2022-11-28 NOTE — Anesthesia Preprocedure Evaluation (Addendum)
Anesthesia Evaluation  Patient identified by MRN, date of birth, ID band Patient awake    Reviewed: Allergy & Precautions, NPO status , Patient's Chart, lab work & pertinent test results  History of Anesthesia Complications (+) PONV and history of anesthetic complications  Airway Mallampati: I  TM Distance: >3 FB Neck ROM: Full    Dental no notable dental hx. (+) Teeth Intact, Dental Advisory Given, Caps, Chipped,    Pulmonary neg pulmonary ROS   Pulmonary exam normal breath sounds clear to auscultation       Cardiovascular negative cardio ROS Normal cardiovascular exam Rhythm:Regular Rate:Normal  EKG 11/16/22 NSR, Normal   Neuro/Psych  Headaches PSYCHIATRIC DISORDERS  Depression    Athetosis/Dystonia of left foot/toes  Neuromuscular disease    GI/Hepatic Neg liver ROS, hiatal hernia,GERD  Medicated,,S/P Gastric bypass 2016   Endo/Other  negative endocrine ROS    Renal/GU negative Renal ROS  negative genitourinary   Musculoskeletal  (+) Arthritis , Osteoarthritis,  OA right hip   Abdominal   Peds  Hematology  (+) Blood dyscrasia, Sickle cell trait and anemia   Anesthesia Other Findings   Reproductive/Obstetrics                              Anesthesia Physical Anesthesia Plan  ASA: 2  Anesthesia Plan: Spinal   Post-op Pain Management: Minimal or no pain anticipated   Induction:   PONV Risk Score and Plan: 4 or greater and Treatment may vary due to age or medical condition, Propofol infusion, Ondansetron and Midazolam  Airway Management Planned: Natural Airway and Simple Face Mask  Additional Equipment: None  Intra-op Plan:   Post-operative Plan:   Informed Consent: I have reviewed the patients History and Physical, chart, labs and discussed the procedure including the risks, benefits and alternatives for the proposed anesthesia with the patient or authorized representative  who has indicated his/her understanding and acceptance.     Dental advisory given  Plan Discussed with: CRNA and Anesthesiologist  Anesthesia Plan Comments:          Anesthesia Quick Evaluation

## 2022-11-28 NOTE — Anesthesia Postprocedure Evaluation (Signed)
Anesthesia Post Note  Patient: Michelle Molina  Procedure(s) Performed: TOTAL HIP ARTHROPLASTY ANTERIOR APPROACH (Right: Hip)     Patient location during evaluation: PACU Anesthesia Type: Spinal Level of consciousness: oriented and awake and alert Pain management: pain level controlled Vital Signs Assessment: post-procedure vital signs reviewed and stable Respiratory status: spontaneous breathing, respiratory function stable and nonlabored ventilation Cardiovascular status: blood pressure returned to baseline and stable Postop Assessment: no headache, no backache, no apparent nausea or vomiting and spinal receding Anesthetic complications: no   No notable events documented.  Last Vitals:  Vitals:   11/28/22 1645 11/28/22 1719  BP: 117/85 (!) 126/95  Pulse: 61 (!) 53  Resp: 12 16  Temp: 36.6 C (!) 35.9 C  SpO2: 100% 100%    Last Pain:  Vitals:   11/28/22 1719  TempSrc: Oral  PainSc:                  Jaymien Landin A.

## 2022-11-29 ENCOUNTER — Encounter (HOSPITAL_COMMUNITY): Payer: Self-pay | Admitting: Orthopedic Surgery

## 2022-11-29 DIAGNOSIS — M1611 Unilateral primary osteoarthritis, right hip: Secondary | ICD-10-CM | POA: Diagnosis not present

## 2022-11-29 LAB — CBC
HCT: 33.9 % — ABNORMAL LOW (ref 36.0–46.0)
Hemoglobin: 10.5 g/dL — ABNORMAL LOW (ref 12.0–15.0)
MCH: 24.9 pg — ABNORMAL LOW (ref 26.0–34.0)
MCHC: 31 g/dL (ref 30.0–36.0)
MCV: 80.5 fL (ref 80.0–100.0)
Platelets: 144 10*3/uL — ABNORMAL LOW (ref 150–400)
RBC: 4.21 MIL/uL (ref 3.87–5.11)
RDW: 16.5 % — ABNORMAL HIGH (ref 11.5–15.5)
WBC: 6.2 10*3/uL (ref 4.0–10.5)
nRBC: 0 % (ref 0.0–0.2)

## 2022-11-29 LAB — BASIC METABOLIC PANEL
Anion gap: 9 (ref 5–15)
BUN: 16 mg/dL (ref 6–20)
CO2: 21 mmol/L — ABNORMAL LOW (ref 22–32)
Calcium: 8.3 mg/dL — ABNORMAL LOW (ref 8.9–10.3)
Chloride: 108 mmol/L (ref 98–111)
Creatinine, Ser: 0.82 mg/dL (ref 0.44–1.00)
GFR, Estimated: >60 mL/min (ref >60)
Glucose, Bld: 153 mg/dL — ABNORMAL HIGH (ref 70–99)
Potassium: 4.3 mmol/L (ref 3.5–5.1)
Sodium: 138 mmol/L (ref 135–145)

## 2022-11-29 MED ORDER — ONDANSETRON HCL 4 MG PO TABS: 4.0000 mg | ORAL_TABLET | Freq: Three times a day (TID) | ORAL | 0 refills | Status: DC | PRN

## 2022-11-29 MED ORDER — APIXABAN 2.5 MG PO TABS: 2.5000 mg | ORAL_TABLET | Freq: Two times a day (BID) | ORAL | 0 refills | Status: DC

## 2022-11-29 MED ORDER — POLYETHYLENE GLYCOL 3350 17 G PO PACK: 17.0000 g | PACK | Freq: Every day | ORAL | 0 refills | Status: AC | PRN

## 2022-11-29 MED ORDER — DOCUSATE SODIUM 100 MG PO CAPS: 100.0000 mg | ORAL_CAPSULE | Freq: Two times a day (BID) | ORAL | 0 refills | Status: AC

## 2022-11-29 MED ORDER — HYDROCODONE-ACETAMINOPHEN 5-325 MG PO TABS: 1.0000 | ORAL_TABLET | ORAL | 0 refills | Status: AC | PRN

## 2022-11-29 MED ORDER — SENNA 8.6 MG PO TABS: 2.0000 | ORAL_TABLET | Freq: Every day | ORAL | 0 refills | Status: AC

## 2022-11-29 NOTE — Plan of Care (Signed)
  Problem: Education: Goal: Knowledge of the prescribed therapeutic regimen will improve Outcome: Progressing Goal: Understanding of discharge needs will improve Outcome: Progressing Goal: Individualized Educational Video(s) Outcome: Completed/Met   Problem: Activity: Goal: Ability to avoid complications of mobility impairment will improve Outcome: Progressing Goal: Ability to tolerate increased activity will improve Outcome: Progressing   Problem: Clinical Measurements: Goal: Postoperative complications will be avoided or minimized Outcome: Progressing   Problem: Pain Management: Goal: Pain level will decrease with appropriate interventions Outcome: Progressing   Problem: Skin Integrity: Goal: Will show signs of wound healing Outcome: Progressing   Problem: Education: Goal: Knowledge of General Education information will improve Description: Including pain rating scale, medication(s)/side effects and non-pharmacologic comfort measures Outcome: Progressing   Problem: Health Behavior/Discharge Planning: Goal: Ability to manage health-related needs will improve Outcome: Progressing   Problem: Clinical Measurements: Goal: Ability to maintain clinical measurements within normal limits will improve Outcome: Progressing Goal: Will remain free from infection Outcome: Progressing Goal: Diagnostic test results will improve Outcome: Progressing Goal: Respiratory complications will improve Outcome: Progressing Goal: Cardiovascular complication will be avoided Outcome: Progressing   Problem: Activity: Goal: Risk for activity intolerance will decrease Outcome: Progressing   Problem: Nutrition: Goal: Adequate nutrition will be maintained Outcome: Progressing   Problem: Coping: Goal: Level of anxiety will decrease Outcome: Progressing   Problem: Elimination: Goal: Will not experience complications related to bowel motility Outcome: Progressing Goal: Will not experience  complications related to urinary retention Outcome: Progressing   Problem: Pain Managment: Goal: General experience of comfort will improve Outcome: Progressing   Problem: Safety: Goal: Ability to remain free from injury will improve Outcome: Progressing

## 2022-11-29 NOTE — Evaluation (Signed)
Physical Therapy Evaluation Patient Details Name: Michelle Molina MRN: [id number] DOB: [birth year] Today's Date: 11/29/2022  History of Present Illness  57 y.o. female admitted 11/28/22 for R AA-THA. PMH: anemia, GERD, gastric bypass.  Clinical Impression  Pt is s/p THA resulting in the deficits listed below (see PT Problem List). Pt ambulated 46' with RW, distance limited by lightheadedness and nausea. Assisted pt to recliner where BP was 116/73, HR 64, SpO2 99% on room air. Pt will need to be able to ascend a flight of stairs to enter her home. Will plan on second session this afternoon.  Pt will benefit from acute skilled PT to increase their independence and safety with mobility to facilitate discharge.         Recommendations for follow up therapy are one component of a multi-disciplinary discharge planning process, led by the attending physician.  Recommendations may be updated based on patient status, additional functional criteria and insurance authorization.  Follow Up Recommendations       Assistance Recommended at Discharge Intermittent Supervision/Assistance  Patient can return home with the following  A little help with bathing/dressing/bathroom;Assistance with cooking/housework;Assist for transportation;Help with stairs or ramp for entrance    Equipment Recommendations Rolling walker (2 wheels)  Recommendations for Other Services       Functional Status Assessment Patient has had a recent decline in their functional status and demonstrates the ability to make significant improvements in function in a reasonable and predictable amount of time.     Precautions / Restrictions Precautions Precautions: Fall Precaution Comments: denies falls in past 6 months Restrictions Weight Bearing Restrictions: No      Mobility  Bed Mobility Overal bed mobility: Needs Assistance Bed Mobility: Supine to Sit     Supine to sit: Supervision, HOB elevated     General bed mobility  comments: used gait belt as RLE lifter    Transfers Overall transfer level: Needs assistance Equipment used: Rolling walker (2 wheels) Transfers: Sit to/from Stand Sit to Stand: Min guard, From elevated surface           General transfer comment: VCs hand placement    Ambulation/Gait Ambulation/Gait assistance: Supervision, Min guard Gait Distance (Feet): 75 Feet Assistive device: Rolling walker (2 wheels) Gait Pattern/deviations: Step-to pattern Gait velocity: decr     General Gait Details: VCs sequencing, distance limited by lightheadedness/nausea  Stairs            Wheelchair Mobility    Modified Rankin (Stroke Patients Only)       Balance Overall balance assessment: Modified Independent                                           Pertinent Vitals/Pain Pain Assessment Pain Assessment: 0-10 Pain Score: 8  Pain Location: R hip Pain Descriptors / Indicators: Sore, Burning Pain Intervention(s): Limited activity within patient's tolerance, Monitored during session, Premedicated before session, Ice applied    Home Living Family/patient expects to be discharged to:: Private residence Living Arrangements: Spouse/significant other   Type of Home: Apartment Home Access: Stairs to enter   Secretary/administrator of Steps: flight   Home Layout: One level Home Equipment: Agricultural consultant (2 wheels) Additional Comments: boyfriend to assist    Prior Function Prior Level of Function : Independent/Modified Independent             Mobility Comments: walked without AD  Hand Dominance        Extremity/Trunk Assessment   Upper Extremity Assessment Upper Extremity Assessment: Overall WFL for tasks assessed    Lower Extremity Assessment Lower Extremity Assessment: RLE deficits/detail RLE Deficits / Details: R hip flexion AAROm ~35* RLE Sensation: WNL    Cervical / Trunk Assessment Cervical / Trunk Assessment: Normal   Communication   Communication: No difficulties  Cognition Arousal/Alertness: Awake/alert Behavior During Therapy: WFL for tasks assessed/performed Overall Cognitive Status: Within Functional Limits for tasks assessed                                          General Comments      Exercises Total Joint Exercises Ankle Circles/Pumps: AROM, Both, 10 reps, Supine   Assessment/Plan    PT Assessment Patient needs continued PT services  PT Problem List Decreased activity tolerance;Decreased mobility;Pain;Decreased strength;Decreased balance       PT Treatment Interventions Gait training;Therapeutic exercise;Therapeutic activities;DME instruction;Patient/family education    PT Goals (Current goals can be found in the Care Plan section)  Acute Rehab PT Goals Patient Stated Goal: return to working out at gym, 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: A Little Help needed to walk in hospital room?: A Little Help needed climbing 3-5 steps with a railing? : A Lot 6 Click Score: 17    End of Session Equipment Utilized During Treatment: Gait belt Activity Tolerance: Treatment limited secondary to medical complications (Comment) (lightheadedness/nausea) Patient left: in chair;with call bell/phone within reach;with chair alarm set Nurse Communication:  Mobility status PT Visit Diagnosis: Pain;Difficulty in walking, not elsewhere classified (R26.2) Pain - Right/Left: Right Pain - part of body: Hip    Time: 1610-9604 PT Time Calculation (min) (ACUTE ONLY): 35 min   Charges:   PT Evaluation $PT Eval Moderate Complexity: 1 Mod PT Treatments $Gait Training: 8-22 mins        Ralene Bathe Kistler PT 11/29/2022  Acute Rehabilitation Services  Office [phone]

## 2022-11-29 NOTE — Progress Notes (Addendum)
Physical Therapy Treatment Patient Details Name: Michelle Molina MRN: [id number] DOB: Oct 10, 1965 Today's Date: 11/29/2022   History of Present Illness 57 y.o. female admitted 11/28/22 for R AA-THA. PMH: anemia, GERD, gastric bypass.    PT Comments    Pt is progressing well with mobility, she ambulated 53' with RW, no loss of balance. Stair training completed. Pt became dizzy and nauseous after ambulating, RN notified. BP sitting 111/78, BP standing 119/71. Pt has a flight of stairs to get up to her 2nd floor apartment. She's not yet ready to DC home due to dizziness and nausea.     Recommendations for follow up therapy are one component of a multi-disciplinary discharge planning process, led by the attending physician.  Recommendations may be updated based on patient status, additional functional criteria and insurance authorization.  Follow Up Recommendations       Assistance Recommended at Discharge Intermittent Supervision/Assistance  Patient can return home with the following A little help with bathing/dressing/bathroom;Assistance with cooking/housework;Assist for transportation;Help with stairs or ramp for entrance   Equipment Recommendations  Rolling walker (2 wheels)    Recommendations for Other Services       Precautions / Restrictions Precautions Precautions: Fall Precaution Comments: denies falls in past 6 months Restrictions Weight Bearing Restrictions: No     Mobility  Bed Mobility Overal bed mobility: Needs Assistance Bed Mobility: Supine to Sit     Supine to sit: Supervision, HOB elevated     General bed mobility comments: up in recliner    Transfers Overall transfer level: Needs assistance Equipment used: Rolling walker (2 wheels) Transfers: Sit to/from Stand Sit to Stand: Min guard, From elevated surface           General transfer comment: VCs hand placement    Ambulation/Gait Ambulation/Gait assistance: Supervision, Min guard Gait Distance  (Feet): 80 Feet Assistive device: Rolling walker (2 wheels) Gait Pattern/deviations: Step-to pattern, Decreased step length - right, Decreased step length - left Gait velocity: decr     General Gait Details: VCs sequencing, distance limited by lightheadedness/nausea   Stairs Stairs: Yes Stairs assistance: Min guard Stair Management: One rail Right, Sideways, Step to pattern Number of Stairs: 3 General stair comments: VCs sequencing   Wheelchair Mobility    Modified Rankin (Stroke Patients Only)       Balance Overall balance assessment: Modified Independent                                          Cognition Arousal/Alertness: Awake/alert Behavior During Therapy: WFL for tasks assessed/performed Overall Cognitive Status: Within Functional Limits for tasks assessed                                          Exercises Total Joint Exercises Ankle Circles/Pumps: AROM, Both, 10 reps, Supine    General Comments        Pertinent Vitals/Pain Pain Assessment Pain Assessment: 0-10 Pain Score: 7  Pain Location: R hip Pain Descriptors / Indicators: Sore, Burning Pain Intervention(s): Limited activity within patient's tolerance, Monitored during session, Premedicated before session, Ice applied    Home Living                          Prior Function  PT Goals (current goals can now be found in the care plan section) Acute Rehab PT Goals Patient Stated Goal: return to working out at gym, 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 PT Goal Formulation: With patient/family Time For Goal Achievement: 12/06/22 Potential to Achieve Goals: Good Progress towards PT goals: Progressing toward goals    Frequency    7X/week      PT Plan Current plan remains appropriate    Co-evaluation              AM-PAC PT "6 Clicks" Mobility   Outcome Measure  Help needed turning from your back to your side  while in a flat bed without using bedrails?: A Little Help needed moving from lying on your back to sitting on the side of a flat bed without using bedrails?: A Little Help needed moving to and from a bed to a chair (including a wheelchair)?: A Little Help needed standing up from a chair using your arms (e.g., wheelchair or bedside chair)?: A Little Help needed to walk in hospital room?: A Little Help needed climbing 3-5 steps with a railing? : A Little 6 Click Score: 18    End of Session Equipment Utilized During Treatment: Gait belt Activity Tolerance: Treatment limited secondary to medical complications (Comment) (lightheadedness/nausea) Patient left: in chair;with call bell/phone within reach;with chair alarm set Nurse Communication: Mobility status PT Visit Diagnosis: Pain;Difficulty in walking, not elsewhere classified (R26.2) Pain - Right/Left: Right Pain - part of body: Hip     Time: 4098-1191 PT Time Calculation (min) (ACUTE ONLY): 23 min  Charges:  $Gait Training: 8-22 mins $Therapeutic Activity: 8-22 mins                     Ralene Bathe Kistler PT 11/29/2022  Acute Rehabilitation Services  Office [phone]

## 2022-11-29 NOTE — TOC Transition Note (Signed)
Transition of Care [facility]) - CM/SW Discharge Note   Patient Details  Name: Michelle Molina MRN: [id number] Date of Birth: [birth year]  Transition [facility]) CM/SW Contact:  Amada Jupiter, LCSW Phone Number: 11/29/2022, 9:45 AM   Clinical Narrative:     Met with pt who reports need for RW - to be delivered to room via [employer].  Plan for HEP.  No further TOC needs.  Final next level of care: Home/Self Care Barriers to Discharge: No Barriers Identified   Patient Goals and CMS Choice      Discharge Placement                         Discharge Plan and Services Additional resources added to the After Visit Summary for                  DME Arranged: Walker rolling DME Agency: [employer]                  Social Determinants of Health (SDOH) Interventions SDOH Screenings   Food Insecurity: No Food Insecurity (11/28/2022)  Housing: Patient Declined (11/28/2022)  Transportation Needs: No Transportation Needs (11/28/2022)  Utilities: Not At Risk (11/28/2022)  Tobacco Use: Low Risk  (11/28/2022)     Readmission Risk Interventions     No data to display

## 2022-11-29 NOTE — Progress Notes (Signed)
    Subjective:  Patient reports pain as mild to moderate.  Currently denies N/V/CP/SOB/Abd pain. She describes her pain more as thigh soreness. She had some issues with nausea from her medication yesterday, but has been doing okay with food on her stomach and zofran.   All questions solicited and answered this morning.   Objective:   VITALS:   Vitals:   11/28/22 2004 11/29/22 0155 11/29/22 0612 11/29/22 0953  BP: 110/80 133/81 123/74 (!) 143/83  Pulse: (!) 57 65 73 63  Resp: 18 17 16 16   Temp: 97.7 F (36.5 C) 98 F (36.7 C) 98.7 F (37.1 C) 98.4 F (36.9 C)  TempSrc: Oral  Oral   SpO2: 100% 100% 100% 99%  Weight:      Height:        Patient lying in bed. NAD.  Neurologically intact ABD soft Neurovascular intact Sensation intact distally Intact pulses distally Dorsiflexion/Plantar flexion intact Incision: dressing C/D/I No cellulitis present Compartment soft   Lab Results  Component Value Date   WBC 6.2 11/29/2022   HGB 10.5 (L) 11/29/2022   HCT 33.9 (L) 11/29/2022   MCV 80.5 11/29/2022   PLT 144 (L) 11/29/2022   BMET    Component Value Date/Time   NA 138 11/29/2022 0353   NA 140 03/30/2020 1045   NA 140 01/06/2016 0808   K 4.3 11/29/2022 0353   K 4.2 01/06/2016 0808   CL 108 11/29/2022 0353   CL 110 (H) 06/24/2012 1416   CO2 21 (L) 11/29/2022 0353   CO2 23 01/06/2016 0808   GLUCOSE 153 (H) 11/29/2022 0353   GLUCOSE 95 01/06/2016 0808   BUN 16 11/29/2022 0353   BUN 12 03/30/2020 1045   BUN 8.6 01/06/2016 0808   CREATININE 0.82 11/29/2022 0353   CREATININE 0.8 01/06/2016 0808   CALCIUM 8.3 (L) 11/29/2022 0353   CALCIUM 8.9 01/06/2016 0808   EGFR >90 01/06/2016 0808   GFRNONAA >60 11/29/2022 0353   GFRNONAA >60 06/24/2012 1416     Assessment/Plan: 1 Day Post-Op   Principal Problem:   Osteoarthritis of right hip Active Problems:   Primary osteoarthritis of right hip  ABLA. Hemoglobin 10.5. Continue to monitor.   WBAT with walker DVT  ppx:  Eliquis , SCDs, TEDS PO pain control PT/OT: PT has not seen yet. PT to come by today.  Dispo: D/c home once cleared with PT and has voided.    Clois Dupes, PA-C 11/29/2022, 11:30 AM   [facility]  [place] [address], [address], [place], [place] [address] Phone: [phone] [email or web address] Facebook  [employer]

## 2022-11-30 ENCOUNTER — Other Ambulatory Visit (HOSPITAL_COMMUNITY): Payer: Self-pay

## 2022-11-30 ENCOUNTER — Encounter: Payer: Self-pay | Admitting: Hematology and Oncology

## 2022-11-30 DIAGNOSIS — M1611 Unilateral primary osteoarthritis, right hip: Secondary | ICD-10-CM | POA: Diagnosis not present

## 2022-11-30 LAB — CBC
HCT: 33.5 % — ABNORMAL LOW (ref 36.0–46.0)
Hemoglobin: 10.4 g/dL — ABNORMAL LOW (ref 12.0–15.0)
MCH: 24.9 pg — ABNORMAL LOW (ref 26.0–34.0)
MCHC: 31 g/dL (ref 30.0–36.0)
MCV: 80.1 fL (ref 80.0–100.0)
Platelets: 142 10*3/uL — ABNORMAL LOW (ref 150–400)
RBC: 4.18 MIL/uL (ref 3.87–5.11)
RDW: 16.4 % — ABNORMAL HIGH (ref 11.5–15.5)
WBC: 7.3 10*3/uL (ref 4.0–10.5)
nRBC: 0 % (ref 0.0–0.2)

## 2022-11-30 MED ORDER — APIXABAN 2.5 MG PO TABS
2.5000 mg | ORAL_TABLET | Freq: Two times a day (BID) | ORAL | 0 refills | Status: DC
  Filled 2022-11-30: qty 60, 30d supply, fill #0

## 2022-11-30 NOTE — Discharge Summary (Signed)
Physician Discharge Summary  Patient ID: Michelle Molina MRN: [id number] DOB/AGE: 12-03-1965 57 y.o.  Admit date: 11/28/2022 Discharge date: 11/30/2022  Admission Diagnoses:  Osteoarthritis of right hip  Discharge Diagnoses:  Principal Problem:   Osteoarthritis of right hip Active Problems:   Primary osteoarthritis of right hip   Past Medical History:  Diagnosis Date   Allergy    Anemia    GERD (gastroesophageal reflux disease)    Hiatal hernia    Migraines    PONV (postoperative nausea and vomiting)    Sickle cell trait (HCC)    Ulcer     Surgeries: Procedure(s): TOTAL HIP ARTHROPLASTY ANTERIOR APPROACH on 11/28/2022   Consultants (if any):   Discharged Condition: Improved  Hospital Course: Michelle Molina is an 57 y.o. female who was admitted 11/28/2022 with a diagnosis of Osteoarthritis of right hip and went to the operating room on 11/28/2022 and underwent the above named procedures.    She was given perioperative antibiotics:  Anti-infectives (From admission, onward)    Start     Dose/Rate Route Frequency Ordered Stop   11/28/22 2000  ceFAZolin (ANCEF) IVPB 2g/100 mL premix        2 g 200 mL/hr over 30 Minutes Intravenous Every 6 hours 11/28/22 1719 11/29/22 0412   11/28/22 1200  ceFAZolin (ANCEF) IVPB 2g/100 mL premix        2 g 200 mL/hr over 30 Minutes Intravenous On call to O.R. 11/28/22 1148 11/28/22 1415       She was given sequential compression devices, early ambulation, and Eliquis for DVT prophylaxis.  POD#1 Patient having some nausea with pain medication that was treated with zofran. She ambulated 75 and 80 feet with PT but was limited by nausea and lightheaded.   POD#2 Patient feeling much better. Nausea improved. She ambulated 150 and 130 feet with PT. Discharged home with HEP.   She benefited maximally from the hospital stay and there were no complications.    Recent vital signs:  Vitals:   11/29/22 2133 11/30/22 0546  BP: 138/84 (!) 111/56  Pulse:  73 73  Resp: 17 17  Temp: 99.5 F (37.5 C) 98.6 F (37 C)  SpO2: 100% 98%    Recent laboratory studies:  Lab Results  Component Value Date   HGB 10.4 (L) 11/30/2022   HGB 10.5 (L) 11/29/2022   HGB 10.6 (L) 03/30/2020   Lab Results  Component Value Date   WBC 7.3 11/30/2022   PLT 142 (L) 11/30/2022   No results found for: "INR" Lab Results  Component Value Date   NA 138 11/29/2022   K 4.3 11/29/2022   CL 108 11/29/2022   CO2 21 (L) 11/29/2022   BUN 16 11/29/2022   CREATININE 0.82 11/29/2022   GLUCOSE 153 (H) 11/29/2022     Allergies as of 11/30/2022       Reactions   Ibuprofen Other (See Comments)   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   Tramadol Hcl Nausea Only   Dizziness        Medication List     STOP taking these medications    acetaminophen 500 MG tablet Commonly known as: TYLENOL   meloxicam 7.5 MG tablet Commonly known as: MOBIC       TAKE these medications    calcium citrate 950 (200 Ca) MG tablet Commonly  known as: CALCITRATE - dosed in mg elemental calcium Take 200 mg of elemental calcium by mouth daily.   docusate sodium 100 MG capsule Commonly known as: Colace Take 1 capsule (100 mg total) by mouth 2 (two) times daily.   Eliquis 2.5 MG Tabs tablet Generic drug: apixaban Take 1 tablet (2.5 mg total) by mouth 2 (two) times daily.   hydrochlorothiazide 25 MG tablet Commonly known as: HYDRODIURIL Take 25 mg by mouth daily.   HYDROcodone-acetaminophen 5-325 MG tablet Commonly known as: NORCO/VICODIN Take 1 tablet by mouth every 4 (four) hours as needed for up to 7 days for moderate pain or severe pain.   ondansetron 4 MG tablet Commonly known as: Zofran Take 1 tablet (4 mg total) by mouth every 8 (eight) hours as needed for nausea or vomiting.   polyethylene glycol 17 g  packet Commonly known as: MiraLax Take 17 g by mouth daily as needed for mild constipation or moderate constipation.   senna 8.6 MG Tabs tablet Commonly known as: SENOKOT Take 2 tablets (17.2 mg total) by mouth at bedtime for 15 days.   vitamin C 1000 MG tablet Take 1,000 mg by mouth daily.               Discharge Care Instructions  (From admission, onward)           Start     Ordered   11/29/22 0000  Weight bearing as tolerated        11/29/22 1138   11/29/22 0000  Change dressing       Comments: Do not change your dressing.   11/29/22 1138              WEIGHT BEARING   Weight bearing as tolerated with assist device (walker, cane, etc) as directed, use it as long as suggested by your surgeon or therapist, typically at least 4-6 weeks.   EXERCISES  Results after joint replacement surgery are often greatly improved when you follow the exercise, range of motion and muscle strengthening exercises prescribed by your doctor. Safety measures are also important to protect the joint from further injury. Any time any of these exercises cause you to have increased pain or swelling, decrease what you are doing until you are comfortable again and then slowly increase them. If you have problems or questions, call your caregiver or physical therapist for advice.   Rehabilitation is important following a joint replacement. After just a few days of immobilization, the muscles of the leg can become weakened and shrink (atrophy).  These exercises are designed to build up the tone and strength of the thigh and leg muscles and to improve motion. Often times heat used for twenty to thirty minutes before working out will loosen up your tissues and help with improving the range of motion but do not use heat for the first two weeks following surgery (sometimes heat can increase post-operative swelling).   These exercises can be done on a training (exercise) mat, on the floor, on a table or  on a bed. Use whatever works the best and is most comfortable for you.    Use music or television while you are exercising so that the exercises are a pleasant break in your day. This will make your life better with the exercises acting as a break in your routine that you can look forward to.   Perform all exercises about fifteen times, three times per day or as directed.  You should exercise both the  operative leg and the other leg as well.  Exercises include:   Quad Sets - Tighten up the muscle on the front of the thigh (Quad) and hold for 5-10 seconds.   Straight Leg Raises - With your knee straight (if you were given a brace, keep it on), lift the leg to 60 degrees, hold for 3 seconds, and slowly lower the leg.  Perform this exercise against resistance later as your leg gets stronger.  Leg Slides: Lying on your back, slowly slide your foot toward your buttocks, bending your knee up off the floor (only go as far as is comfortable). Then slowly slide your foot back down until your leg is flat on the floor again.  Angel Wings: Lying on your back spread your legs to the side as far apart as you can without causing discomfort.  Hamstring Strength:  Lying on your back, push your heel against the floor with your leg straight by tightening up the muscles of your buttocks.  Repeat, but this time bend your knee to a comfortable angle, and push your heel against the floor.  You may put a pillow under the heel to make it more comfortable if necessary.   A rehabilitation program following joint replacement surgery can speed recovery and prevent re-injury in the future due to weakened muscles. Contact your doctor or a physical therapist for more information on knee rehabilitation.    CONSTIPATION  Constipation is defined medically as fewer than three stools per week and severe constipation as less than one stool per week.  Even if you have a regular bowel pattern at home, your normal regimen is likely to be  disrupted due to multiple reasons following surgery.  Combination of anesthesia, postoperative narcotics, change in appetite and fluid intake all can affect your bowels.   YOU MUST use at least one of the following options; they are listed in order of increasing strength to get the job done.  They are all available over the counter, and you may need to use some, POSSIBLY even all of these options:    Drink plenty of fluids (prune juice may be helpful) and high fiber foods Colace 100 mg by mouth twice a day  Senokot for constipation as directed and as needed Dulcolax (bisacodyl), take with full glass of water  Miralax (polyethylene glycol) once or twice a day as needed.  If you have tried all these things and are unable to have a bowel movement in the first 3-4 days after surgery call either your surgeon or your primary doctor.    If you experience loose stools or diarrhea, hold the medications until you stool forms back up.  If your symptoms do not get better within 1 week or if they get worse, check with your doctor.  If you experience "the worst abdominal pain ever" or develop nausea or vomiting, please contact the office immediately for further recommendations for treatment.   ITCHING:  If you experience itching with your medications, try taking only a single pain pill, or even half a pain pill at a time.  You can also use Benadryl over the counter for itching or also to help with sleep.   TED HOSE STOCKINGS:  Use stockings on both legs until for at least 2 weeks or as directed by physician office. They may be removed at night for sleeping.  MEDICATIONS:  See your medication summary on the "After Visit Summary" that nursing will review with you.  You may have some home  medications which will be placed on hold until you complete the course of blood thinner medication.  It is important for you to complete the blood thinner medication as prescribed.  PRECAUTIONS:  If you experience chest pain or  shortness of breath - call 911 immediately for transfer to the hospital emergency department.   If you develop a fever greater that 101 F, purulent drainage from wound, increased redness or drainage from wound, foul odor from the wound/dressing, or calf pain - CONTACT YOUR SURGEON.                                                   FOLLOW-UP APPOINTMENTS:  If you do not already have a post-op appointment, please call the office for an appointment to be seen by your surgeon.  Guidelines for how soon to be seen are listed in your "After Visit Summary", but are typically between 1-4 weeks after surgery.   MAKE SURE YOU:  Understand these instructions.  Get help right away if you are not doing well or get worse.    Thank you for letting us be a part of your medical care team.  It is a privilege we respect greatly.  We hope these instructions will help you stay on track for a fast and full recovery!   Diagnostic Studies: DG HIP PORT UNILAT WITH PELVIS 1V RIGHT  Result Date: 11/28/2022 CLINICAL DATA:  Right hip replacement. EXAM: DG HIP (WITH OR WITHOUT PELVIS) 1V PORT RIGHT COMPARISON:  None Available. FINDINGS: Five fluoroscopic spot views of the pelvis and right hip obtained in the operating room. Sequential images during hip arthroplasty. Fluoroscopy time 6 seconds. Dose 0.7153 mGy. IMPRESSION: Intraoperative fluoroscopy during right hip arthroplasty. Electronically Signed   By: Narda Rutherford M.D.   On: 11/28/2022 16:35   DG Pelvis Portable  Result Date: 11/28/2022 CLINICAL DATA:  Postop right hip. EXAM: PORTABLE PELVIS 1-2 VIEWS COMPARISON:  None Available. FINDINGS: Right hip arthroplasty in expected alignment. No periprosthetic lucency or fracture. Recent postsurgical change includes air and edema in the soft tissues. IMPRESSION: Right hip arthroplasty without immediate postoperative complication. Electronically Signed   By: Narda Rutherford M.D.   On: 11/28/2022 16:34   DG C-Arm 1-60 Min-No  Report  Result Date: 11/28/2022 Fluoroscopy was utilized by the requesting physician.  No radiographic interpretation.   DG C-Arm 1-60 Min-No Report  Result Date: 11/28/2022 Fluoroscopy was utilized by the requesting physician.  No radiographic interpretation.    Disposition: Discharge disposition: 01-Home or Self Care       Discharge Instructions     Call MD / Call 911   Complete by: As directed    If you experience chest pain or shortness of breath, CALL 911 and be transported to the hospital emergency room.  If you develope a fever above 101 F, pus (white drainage) or increased drainage or redness at the wound, or calf pain, call your surgeon's office.   Change dressing   Complete by: As directed    Do not change your dressing.   Constipation Prevention   Complete by: As directed    Drink plenty of fluids.  Prune juice may be helpful.  You may use a stool softener, such as Colace (over the counter) 100 mg twice a day.  Use MiraLax (over the counter) for constipation as needed.  Diet - low sodium heart healthy   Complete by: As directed    Discharge instructions   Complete by: As directed    Elevate toes above nose. Use cryotherapy as needed for pain and swelling.   Discharge patient   Complete by: As directed    Discharge disposition: 01-Home or Self Care   Discharge patient date: 11/30/2022   Driving restrictions   Complete by: As directed    No driving for 6 weeks   Increase activity slowly as tolerated   Complete by: As directed    Lifting restrictions   Complete by: As directed    No lifting for 6 weeks   Post-operative opioid taper instructions:   Complete by: As directed    POST-OPERATIVE OPIOID TAPER INSTRUCTIONS: It is important to wean off of your opioid medication as soon as possible. If you do not need pain medication after your surgery it is ok to stop day one. Opioids include: Codeine, Hydrocodone(Norco, Vicodin), Oxycodone(Percocet, oxycontin) and  hydromorphone amongst others.  Long term and even short term use of opiods can cause: Increased pain response Dependence Constipation Depression Respiratory depression And more.  Withdrawal symptoms can include Flu like symptoms Nausea, vomiting And more Techniques to manage these symptoms Hydrate well Eat regular healthy meals Stay active Use relaxation techniques(deep breathing, meditating, yoga) Do Not substitute Alcohol to help with tapering If you have been on opioids for less than two weeks and do not have pain than it is ok to stop all together.  Plan to wean off of opioids This plan should start within one week post op of your joint replacement. Maintain the same interval or time between taking each dose and first decrease the dose.  Cut the total daily intake of opioids by one tablet each day Next start to increase the time between doses. The last dose that should be eliminated is the evening dose.      TED hose   Complete by: As directed    Use stockings (TED hose) for 2 weeks on both leg(s).  You may remove them at night for sleeping.   Weight bearing as tolerated   Complete by: As directed         Follow-up Information     Clois Dupes, PA-C. Schedule an appointment as soon as possible for a visit in 2 week(s).   Specialty: Orthopedic Surgery Why: For wound re-check Contact information: [address] [place] [address] [phone]                  Signed: Clois Dupes 11/30/2022, 3:46 PM

## 2022-11-30 NOTE — Plan of Care (Signed)
  Problem: Education: Goal: Understanding of discharge needs will improve Outcome: Progressing   Problem: Activity: Goal: Ability to avoid complications of mobility impairment will improve Outcome: Progressing   Problem: Pain Management: Goal: Pain level will decrease with appropriate interventions Outcome: Progressing   

## 2022-11-30 NOTE — Progress Notes (Signed)
Physical Therapy Treatment Patient Details Name: Michelle Molina MRN: [id number] DOB: 08/11/1965 Today's Date: 11/30/2022   History of Present Illness 57 y.o. female admitted 11/28/22 for R AA-THA. PMH: anemia, GERD, gastric bypass.    PT Comments    Pt is progressing well with mobility and is ready to DC home from a PT standpoint. Stair training completed with pt's significant other present.   Recommendations for follow up therapy are one component of a multi-disciplinary discharge planning process, led by the attending physician.  Recommendations may be updated based on patient status, additional functional criteria and insurance authorization.  Follow Up Recommendations       Assistance Recommended at Discharge Intermittent Supervision/Assistance  Patient can return home with the following A little help with bathing/dressing/bathroom;Assistance with cooking/housework;Assist for transportation;Help with stairs or ramp for entrance   Equipment Recommendations  Rolling walker (2 wheels)    Recommendations for Other Services       Precautions / Restrictions Precautions Precautions: Fall Precaution Comments: denies falls in past 6 months Restrictions Weight Bearing Restrictions: No     Mobility  Bed Mobility Overal bed mobility: Modified Independent Bed Mobility: Supine to Sit     Supine to sit: HOB elevated, Modified independent (Device/Increase time)     General bed mobility comments: up in recliner    Transfers Overall transfer level: Needs assistance Equipment used: Rolling walker (2 wheels) Transfers: Sit to/from Stand Sit to Stand: Modified independent (Device/Increase time)           General transfer comment: VCs hand placement    Ambulation/Gait Ambulation/Gait assistance: Modified independent (Device/Increase time) Gait Distance (Feet): 130 Feet Assistive device: Rolling walker (2 wheels) Gait Pattern/deviations: Step-to pattern, Decreased step length  - left, Decreased step length - right Gait velocity: decr     General Gait Details: no loss of balance   Stairs Stairs: Yes Stairs assistance: Min guard Stair Management: One rail Right, Sideways, Step to pattern Number of Stairs: 5 General stair comments: VCs sequencing   Wheelchair Mobility    Modified Rankin (Stroke Patients Only)       Balance Overall balance assessment: Modified Independent                                          Cognition Arousal/Alertness: Awake/alert Behavior During Therapy: WFL for tasks assessed/performed Overall Cognitive Status: Within Functional Limits for tasks assessed                                          Exercises Total Joint Exercises Ankle Circles/Pumps: AROM, Both, 10 reps, Supine Quad Sets: AROM, Both, 5 reps, Supine Short Arc Quad: AROM, Right, 5 reps, Supine Heel Slides: AAROM, Right, 5 reps, Supine    General Comments        Pertinent Vitals/Pain Pain Assessment Pain Score: 4  Pain Location: R hip Pain Descriptors / Indicators: Sore Pain Intervention(s): Limited activity within patient's tolerance, Monitored during session, Premedicated before session, Ice applied    Home Living                          Prior Function            PT Goals (current goals can now be found in the care  plan section) Acute Rehab PT Goals Patient Stated Goal: return to working out at gym, 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 PT Goal Formulation: With patient/family Time For Goal Achievement: 12/06/22 Potential to Achieve Goals: Good Progress towards PT goals: Progressing toward goals    Frequency    7X/week      PT Plan Current plan remains appropriate    Co-evaluation              AM-PAC PT "6 Clicks" Mobility   Outcome Measure  Help needed turning from your back to your side while in a flat bed without using bedrails?: None Help needed moving from  lying on your back to sitting on the side of a flat bed without using bedrails?: A Little Help needed moving to and from a bed to a chair (including a wheelchair)?: None Help needed standing up from a chair using your arms (e.g., wheelchair or bedside chair)?: None Help needed to walk in hospital room?: None Help needed climbing 3-5 steps with a railing? : A Little 6 Click Score: 22    End of Session Equipment Utilized During Treatment: Gait belt Activity Tolerance: Patient tolerated treatment well Patient left: with call bell/phone within reach;with family/visitor present;in bed Nurse Communication: Mobility status PT Visit Diagnosis: Pain;Difficulty in walking, not elsewhere classified (R26.2) Pain - Right/Left: Right Pain - part of body: Hip     Time: 6045-4098 PT Time Calculation (min) (ACUTE ONLY): 19 min  Charges:  $Gait Training: 8-22 mins                     Ralene Bathe Kistler PT 11/30/2022  Acute Rehabilitation Services  Office [phone]

## 2022-11-30 NOTE — Progress Notes (Signed)
Physical Therapy Treatment Patient Details Name: Michelle Molina MRN: [id number] DOB: [birth year] Today's Date: 11/30/2022   History of Present Illness 57 y.o. female admitted 11/28/22 for R AA-THA. PMH: anemia, GERD, gastric bypass.    PT Comments    Pt is progressing well with mobility, she tolerated increased ambulation distance of 150' with RW, no loss of balance. Instructed pt and s.o. in HEP. Will plan to review stair training during second session today, then I expect she'll be ready to DC home.     Recommendations for follow up therapy are one component of a multi-disciplinary discharge planning process, led by the attending physician.  Recommendations may be updated based on patient status, additional functional criteria and insurance authorization.  Follow Up Recommendations       Assistance Recommended at Discharge Intermittent Supervision/Assistance  Patient can return home with the following A little help with bathing/dressing/bathroom;Assistance with cooking/housework;Assist for transportation;Help with stairs or ramp for entrance   Equipment Recommendations  Rolling walker (2 wheels)    Recommendations for Other Services       Precautions / Restrictions Precautions Precautions: Fall Precaution Comments: denies falls in past 6 months Restrictions Weight Bearing Restrictions: No     Mobility  Bed Mobility Overal bed mobility: Modified Independent Bed Mobility: Supine to Sit     Supine to sit: HOB elevated, Modified independent (Device/Increase time)     General bed mobility comments: used rail, HOB up    Transfers Overall transfer level: Needs assistance Equipment used: Rolling walker (2 wheels) Transfers: Sit to/from Stand Sit to Stand: From elevated surface, Supervision           General transfer comment: VCs hand placement    Ambulation/Gait Ambulation/Gait assistance: Supervision Gait Distance (Feet): 150 Feet Assistive device: Rolling walker  (2 wheels) Gait Pattern/deviations: Step-to pattern Gait velocity: decr     General Gait Details: no loss of balance   Stairs             Wheelchair Mobility    Modified Rankin (Stroke Patients Only)       Balance Overall balance assessment: Modified Independent                                          Cognition Arousal/Alertness: Awake/alert Behavior During Therapy: WFL for tasks assessed/performed Overall Cognitive Status: Within Functional Limits for tasks assessed                                          Exercises Total Joint Exercises Ankle Circles/Pumps: AROM, Both, 10 reps, Supine Quad Sets: AROM, Both, 5 reps, Supine Short Arc Quad: AROM, Right, 5 reps, Supine Heel Slides: AAROM, Right, 5 reps, Supine    General Comments        Pertinent Vitals/Pain Pain Assessment Pain Score: 6  Pain Location: R hip Pain Descriptors / Indicators: Sore, Burning Pain Intervention(s): Limited activity within patient's tolerance, Monitored during session, Premedicated before session, Ice applied    Home Living                          Prior Function            PT Goals (current goals can now be found in the care plan section) Acute  Rehab PT Goals Patient Stated Goal: return to working out at gym, 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 PT Goal Formulation: With patient/family Time For Goal Achievement: 12/06/22 Potential to Achieve Goals: Good Progress towards PT goals: Progressing toward goals    Frequency    7X/week      PT Plan Current plan remains appropriate    Co-evaluation              AM-PAC PT "6 Clicks" Mobility   Outcome Measure  Help needed turning from your back to your side while in a flat bed without using bedrails?: None Help needed moving from lying on your back to sitting on the side of a flat bed without using bedrails?: A Little Help needed moving to and from a  bed to a chair (including a wheelchair)?: None Help needed standing up from a chair using your arms (e.g., wheelchair or bedside chair)?: None Help needed to walk in hospital room?: None Help needed climbing 3-5 steps with a railing? : A Little 6 Click Score: 22    End of Session Equipment Utilized During Treatment: Gait belt Activity Tolerance: Patient tolerated treatment well Patient left: in chair;with call bell/phone within reach;with chair alarm set;with family/visitor present Nurse Communication: Mobility status PT Visit Diagnosis: Pain;Difficulty in walking, not elsewhere classified (R26.2) Pain - Right/Left: Right Pain - part of body: Hip     Time: 4098-1191 PT Time Calculation (min) (ACUTE ONLY): 29 min  Charges:  $Gait Training: 8-22 mins $Therapeutic Exercise: 8-22 mins                     Ralene Bathe Kistler PT 11/30/2022  Acute Rehabilitation Services  Office [phone]

## 2022-11-30 NOTE — Progress Notes (Signed)
    Subjective:  Patient reports pain as mild to moderate.  Currently denies N/V/CP/SOB/abd pain. She states she is still having some soreness and burning just below her incision this morning. She denies any tingling or numbness in LE bilaterally.   [facility] is out of stock with Eliquis, sent medication to [facility] pharmacy for patient.   Objective:   VITALS:   Vitals:   11/29/22 0953 11/29/22 1849 11/29/22 2133 11/30/22 0546  BP: (!) 143/83 (!) 140/79 138/84 (!) 111/56  Pulse: 63 65 73 73  Resp: 16 16 17 17   Temp: 98.4 F (36.9 C) 98.3 F (36.8 C) 99.5 F (37.5 C) 98.6 F (37 C)  TempSrc:   Oral Oral  SpO2: 99% 100% 100% 98%  Weight:      Height:        Patient sitting up in bed. NAD.  Neurologically intact ABD soft Neurovascular intact Sensation intact distally Intact pulses distally Dorsiflexion/Plantar flexion intact Incision: dressing C/D/I No cellulitis present Compartment soft   Lab Results  Component Value Date   WBC 7.3 11/30/2022   HGB 10.4 (L) 11/30/2022   HCT 33.5 (L) 11/30/2022   MCV 80.1 11/30/2022   PLT 142 (L) 11/30/2022   BMET    Component Value Date/Time   NA 138 11/29/2022 0353   NA 140 03/30/2020 1045   NA 140 01/06/2016 0808   K 4.3 11/29/2022 0353   K 4.2 01/06/2016 0808   CL 108 11/29/2022 0353   CL 110 (H) 06/24/2012 1416   CO2 21 (L) 11/29/2022 0353   CO2 23 01/06/2016 0808   GLUCOSE 153 (H) 11/29/2022 0353   GLUCOSE 95 01/06/2016 0808   BUN 16 11/29/2022 0353   BUN 12 03/30/2020 1045   BUN 8.6 01/06/2016 0808   CREATININE 0.82 11/29/2022 0353   CREATININE 0.8 01/06/2016 0808   CALCIUM 8.3 (L) 11/29/2022 0353   CALCIUM 8.9 01/06/2016 0808   EGFR >90 01/06/2016 0808   GFRNONAA >60 11/29/2022 0353   GFRNONAA >60 06/24/2012 1416     Assessment/Plan: 2 Days Post-Op   Principal Problem:   Osteoarthritis of right hip Active Problems:   Primary osteoarthritis of right hip   WBAT with walker DVT ppx:  Eliquis , SCDs,  TEDS PO pain control PT/OT: Patient ambulated 75 and 80 feet with PT yesterday, was limited with some nausea and lightheadedness. Continue PT today.  Dispo: D/c home with HEP once cleared with PT.   Clois Dupes, PA-C 11/30/2022, 8:42 AM   [facility]  [place] [address], [address], [place], [place] [address] Phone: [phone] [email or web address] Facebook  [employer]

## 2022-11-30 NOTE — Progress Notes (Signed)
Patient discharged to home w/ family. Given all belongings, instructions, equipment. Verbalized understanding of instructions. Escorted to pov via w/c. 

## 2022-12-01 ENCOUNTER — Other Ambulatory Visit: Payer: Self-pay

## 2022-12-18 NOTE — Progress Notes (Signed)
Patient Care Team: Frederic Jericho, PA-C as PCP - General (Physician Assistant) Glendale Chard, DO as Consulting Physician (Neurology)  DIAGNOSIS: No diagnosis found.  SUMMARY OF ONCOLOGIC HISTORY: Oncology History   No history exists.    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 She is here for blood count check and follow-up.    ALLERGIES:  is allergic to ibuprofen, aspirin, percocet [oxycodone-acetaminophen], and tramadol hcl.  MEDICATIONS:  Current Outpatient Medications  Medication Sig Dispense Refill   apixaban (ELIQUIS) 2.5 MG TABS tablet Take 1 tablet (2.5 mg total) by mouth 2 (two) times daily. 60 tablet 0   Ascorbic Acid (VITAMIN C) 1000 MG tablet Take 1,000 mg by mouth daily.     calcium citrate (CALCITRATE - DOSED IN MG ELEMENTAL CALCIUM) 950 (200 Ca) MG tablet Take 200 mg of elemental calcium by mouth daily.     docusate sodium (COLACE) 100 MG capsule Take 1 capsule (100 mg total) by mouth 2 (two) times daily. 60 capsule 0   hydrochlorothiazide (HYDRODIURIL) 25 MG tablet Take 25 mg by mouth daily.     ondansetron (ZOFRAN) 4 MG tablet Take 1 tablet (4 mg total) by mouth every 8 (eight) hours as needed for nausea or vomiting. 30 tablet 0   polyethylene glycol (MIRALAX) 17 g packet Take 17 g by mouth daily as needed for mild constipation or moderate constipation. 14 each 0   No current facility-administered medications for this visit.    PHYSICAL EXAMINATION: ECOG PERFORMANCE STATUS: {CHL ONC ECOG PS:[id number]}  There were no vitals filed for this visit. There were no vitals filed for this visit.  BREAST:*** No palpable masses or nodules in either right or left breasts. No palpable axillary supraclavicular or infraclavicular adenopathy no breast tenderness or nipple  discharge. (exam performed in the presence of a chaperone)  LABORATORY DATA:  I have reviewed the data as listed    Latest Ref Rng & Units 11/29/2022    3:53 AM 03/30/2020   10:45 AM 12/22/2019    8:22 AM  CMP  Glucose 70 - 99 mg/dL 409  78  811   BUN 6 - 20 mg/dL 16  12  16    Creatinine 0.44 - 1.00 mg/dL 9.14  7.82  9.56   Sodium 135 - 145 mmol/L 138  140  140   Potassium 3.5 - 5.1 mmol/L 4.3  4.2  3.8   Chloride 98 - 111 mmol/L 108  102  105   CO2 22 - 32 mmol/L 21  22  24    Calcium 8.9 - 10.3 mg/dL 8.3  9.1  9.3   Total Protein 6.0 - 8.5 g/dL  7.0    Total Bilirubin 0.0 - 1.2 mg/dL  0.7    Alkaline Phos 44 - 121 IU/L  160    AST 0 - 40 IU/L  39    ALT 0 - 32 IU/L  22      Lab Results  Component Value Date   WBC 7.3 11/30/2022   HGB 10.4 (L) 11/30/2022   HCT 33.5 (L) 11/30/2022   MCV 80.1 11/30/2022   PLT 142 (L) 11/30/2022   NEUTROABS 1.9 09/10/2017    ASSESSMENT & PLAN:  No problem-specific Assessment & Plan notes found for this encounter.    No orders of the defined types were placed in  this encounter.  The patient has a good understanding of the overall plan. she agrees with it. she will call with any problems that may develop before the next visit here. Total time spent: 30 mins including face to face time and time spent for planning, charting and co-ordination of care   Sherlyn Lick, CMA 12/18/22    I Janan Ridge am acting as a Neurosurgeon for [employer]  ***

## 2022-12-19 ENCOUNTER — Inpatient Hospital Stay: Payer: 59

## 2022-12-19 ENCOUNTER — Inpatient Hospital Stay: Payer: 59 | Attending: Hematology and Oncology | Admitting: Hematology and Oncology

## 2022-12-19 ENCOUNTER — Other Ambulatory Visit: Payer: Self-pay

## 2022-12-19 VITALS — BP 155/91 | HR 73 | Temp 97.7°F | Resp 17 | Ht 64.75 in | Wt 187.6 lb

## 2022-12-19 DIAGNOSIS — D508 Other iron deficiency anemias: Secondary | ICD-10-CM

## 2022-12-19 DIAGNOSIS — K909 Intestinal malabsorption, unspecified: Secondary | ICD-10-CM | POA: Insufficient documentation

## 2022-12-19 DIAGNOSIS — D509 Iron deficiency anemia, unspecified: Secondary | ICD-10-CM | POA: Diagnosis present

## 2022-12-19 DIAGNOSIS — Z9884 Bariatric surgery status: Secondary | ICD-10-CM | POA: Insufficient documentation

## 2022-12-19 LAB — IRON AND IRON BINDING CAPACITY (CC-WL,HP ONLY)
Iron: 29 ug/dL (ref 28–170)
Saturation Ratios: 8 % — ABNORMAL LOW (ref 10.4–31.8)
TIBC: 384 ug/dL (ref 250–450)
UIBC: 355 ug/dL (ref 148–442)

## 2022-12-19 NOTE — Assessment & Plan Note (Signed)
Lab review:  12/22/2019: Hemoglobin 10.2, MCV 73, RDW 17.2, platelets 172 11/30/2022: Hemoglobin 10.4, MCV 80, RDW 16.4, platelets 142  Discussion: I discussed with the patient the different causes of iron deficiency including malabsorption and bleeding.  Recommended that she follow-up with gynecology/gastroenterology for for workup for microcytic iron deficiency anemia.  Plan: Obtain iron studies to evaluate if IV iron therapy will be necessary. Telephone visit to discuss results

## 2022-12-20 LAB — FERRITIN: Ferritin: 15 ng/mL (ref 11–307)

## 2023-01-01 ENCOUNTER — Other Ambulatory Visit: Payer: Self-pay

## 2023-01-01 ENCOUNTER — Inpatient Hospital Stay: Payer: 59 | Attending: Hematology and Oncology

## 2023-01-01 VITALS — BP 122/80 | HR 67 | Temp 98.2°F | Resp 18

## 2023-01-01 DIAGNOSIS — D509 Iron deficiency anemia, unspecified: Secondary | ICD-10-CM | POA: Diagnosis present

## 2023-01-01 DIAGNOSIS — D508 Other iron deficiency anemias: Secondary | ICD-10-CM

## 2023-01-01 DIAGNOSIS — Z9884 Bariatric surgery status: Secondary | ICD-10-CM | POA: Insufficient documentation

## 2023-01-01 MED ORDER — SODIUM CHLORIDE 0.9 % IV SOLN
510.0000 mg | Freq: Once | INTRAVENOUS | Status: AC
  Administered 2023-01-01: 510 mg via INTRAVENOUS
  Filled 2023-01-01: qty 510

## 2023-01-01 MED ORDER — SODIUM CHLORIDE 0.9 % IV SOLN: Freq: Once | INTRAVENOUS | Status: AC

## 2023-01-01 MED ORDER — FAMOTIDINE IN NACL 20-0.9 MG/50ML-% IV SOLN
20.0000 mg | Freq: Once | INTRAVENOUS | Status: AC | PRN
  Administered 2023-01-01: 20 mg via INTRAVENOUS

## 2023-01-01 MED ORDER — DIPHENHYDRAMINE HCL 25 MG PO CAPS
50.0000 mg | ORAL_CAPSULE | Freq: Once | ORAL | Status: AC
  Administered 2023-01-01: 50 mg via ORAL
  Filled 2023-01-01: qty 2

## 2023-01-01 MED ORDER — METHYLPREDNISOLONE SODIUM SUCC 125 MG IJ SOLR
125.0000 mg | Freq: Once | INTRAMUSCULAR | Status: AC | PRN
  Administered 2023-01-01 (×2): 62.5 mg via INTRAVENOUS

## 2023-01-01 MED ORDER — SODIUM CHLORIDE 0.9 % IV SOLN: Freq: Once | INTRAVENOUS | Status: DC | PRN

## 2023-01-01 MED ORDER — ONDANSETRON HCL 4 MG/2ML IJ SOLN
4.0000 mg | Freq: Once | INTRAMUSCULAR | Status: AC
  Administered 2023-01-01: 4 mg via INTRAVENOUS
  Filled 2023-01-01: qty 2

## 2023-01-01 NOTE — Patient Instructions (Signed)

## 2023-01-01 NOTE — Progress Notes (Signed)
Hypersensitivity Reaction note  Date of event: 01/01/23 Time of event: 1015 Generic name of drug involved: ferumoxytol Name of provider notified of the hypersensitivity reaction: Dr Pamelia Hoit Was agent that likely caused hypersensitivity reaction added to Allergies List within EMR? yes Chain of events including reaction signs/symptoms, treatment administered, and outcome (e.g., drug resumed; drug discontinued; sent to Emergency Department; etc.)  At the end of the 30 min post observation, at 1015 am, Pt began to report a dry feeling in nose, numbness/tingling in mouth, difficulty speaking/forming words, without difficulty in breathing, and was having nausea. 1015am- NS hung to gravity, 20mg  IV pepcid given, 1020am- 62.5 IV solumedrol given, and Dr Pamelia Hoit notified. Pt able to lift eyebrows, smile, and puff cheeks without difficulty. VS remained stable. Dr Pamelia Hoit to bedside, pt reported no improvement in symptoms. Dr Pamelia Hoit assessed pt, verbal order for additional 62.5mg  IV solumedrol and 4mg  IV zofran and to observe pt for 1 hour. After 50 min at 1120 pt reported improvement in symptoms and was able to speak without difficulty. After 1 hr observation, pt reported feeling back to baseline, VSS, ambulatory to lobby upon discharge. See MAR for medication administration and flowsheet for VS documentation. Per Dr Pamelia Hoit, cancel second IV Iron appt, appt canceled and pt aware.  Julieanne Manson, RN 01/01/2023 1:41 PM

## 2023-01-03 ENCOUNTER — Telehealth: Payer: Self-pay

## 2023-01-03 NOTE — Telephone Encounter (Signed)
Called Pt to schedule lab appt in one month followed by MD telephone visit 3 days later per MD request. Pt states she does not have her calendar at this time and will call back tomorrow when she has her schedule.

## 2023-01-08 ENCOUNTER — Inpatient Hospital Stay: Payer: 59

## 2023-01-08 ENCOUNTER — Telehealth: Payer: Self-pay

## 2023-01-08 NOTE — Telephone Encounter (Signed)
Called and scheduled Pt for 1 month lab and MD telephone appts post Feraheme reaction per MD request. Appts for 01/29/23 and 02/01/23. Pt verbalized understanding.

## 2023-01-28 ENCOUNTER — Other Ambulatory Visit: Payer: Self-pay | Admitting: Hematology and Oncology

## 2023-01-28 DIAGNOSIS — D508 Other iron deficiency anemias: Secondary | ICD-10-CM

## 2023-01-29 ENCOUNTER — Telehealth: Payer: Self-pay

## 2023-01-29 ENCOUNTER — Inpatient Hospital Stay: Payer: 59

## 2023-01-29 ENCOUNTER — Inpatient Hospital Stay: Payer: 59 | Attending: Hematology and Oncology

## 2023-01-29 DIAGNOSIS — D509 Iron deficiency anemia, unspecified: Secondary | ICD-10-CM | POA: Diagnosis present

## 2023-01-29 DIAGNOSIS — D508 Other iron deficiency anemias: Secondary | ICD-10-CM

## 2023-01-29 DIAGNOSIS — Z9884 Bariatric surgery status: Secondary | ICD-10-CM | POA: Insufficient documentation

## 2023-01-29 LAB — CBC WITH DIFFERENTIAL (CANCER CENTER ONLY)
Abs Immature Granulocytes: 0.01 10*3/uL (ref 0.00–0.07)
Basophils Absolute: 0 10*3/uL (ref 0.0–0.1)
Basophils Relative: 1 %
Eosinophils Absolute: 0.1 10*3/uL (ref 0.0–0.5)
Eosinophils Relative: 3 %
HCT: 38.3 % (ref 36.0–46.0)
Hemoglobin: 12.1 g/dL (ref 12.0–15.0)
Immature Granulocytes: 0 %
Lymphocytes Relative: 45 %
Lymphs Abs: 1.6 10*3/uL (ref 0.7–4.0)
MCH: 24.7 pg — ABNORMAL LOW (ref 26.0–34.0)
MCHC: 31.6 g/dL (ref 30.0–36.0)
MCV: 78.2 fL — ABNORMAL LOW (ref 80.0–100.0)
Monocytes Absolute: 0.3 10*3/uL (ref 0.1–1.0)
Monocytes Relative: 7 %
Neutro Abs: 1.6 10*3/uL — ABNORMAL LOW (ref 1.7–7.7)
Neutrophils Relative %: 44 %
Platelet Count: 215 10*3/uL (ref 150–400)
RBC: 4.9 MIL/uL (ref 3.87–5.11)
RDW: 18.9 % — ABNORMAL HIGH (ref 11.5–15.5)
WBC Count: 3.6 10*3/uL — ABNORMAL LOW (ref 4.0–10.5)
nRBC: 0 % (ref 0.0–0.2)

## 2023-01-29 LAB — FERRITIN: Ferritin: 36 ng/mL (ref 11–307)

## 2023-01-29 LAB — IRON AND IRON BINDING CAPACITY (CC-WL,HP ONLY)
Iron: 79 ug/dL (ref 28–170)
Saturation Ratios: 25 % (ref 10.4–31.8)
TIBC: 321 ug/dL (ref 250–450)
UIBC: 242 ug/dL (ref 148–442)

## 2023-01-29 NOTE — Telephone Encounter (Signed)
Pt called and states she overslept and missed her lab appt. She would like to come in today at 1:30. Appt rescheduled for pt to come in today at 130 for labs.

## 2023-01-29 NOTE — Progress Notes (Signed)
HEMATOLOGY-ONCOLOGY TELEPHONE VISIT PROGRESS NOTE  I connected with our patient on 02/01/23 at  8:00 AM EDT by telephone and verified that I am speaking with the correct person using two identifiers.  I discussed the limitations, risks, security and privacy concerns of performing an evaluation and management service by telephone and the availability of in person appointments.  I also discussed with the patient that there may be a patient responsible charge related to this service. The patient expressed understanding and agreed to proceed.   History of Present Illness: Michelle Molina 57 y.o. female is here because of longstanding history of iron deficiency anemia. She presents to the clinic for a telephone follow-up to review labs.  She had 1 dose of Feraheme and had a severe reaction where she had difficulty with forming words and nausea accompanied by dryness in the nose and strong smells that felt like chemicals.  Treatment was discontinued and she was given Benadryl and Solu-Medrol.  She felt better and was able to be discharged home.  REVIEW OF SYSTEMS:   Constitutional: Denies fevers, chills or abnormal weight loss All other systems were reviewed with the patient and are negative. Observations/Objective:   Assessment Plan:  Iron deficiency anemia Gastric bypass surgery   IV iron: 2017, 2019, Feraheme 1 dose 01/01/2023 (couldn't tolerate: chemical Smell , nose dryness, tingling mouth, speech difficulty) Lab review:   12/22/2019: Hemoglobin 10.2, MCV 73, RDW 17.2, platelets 172 11/30/2022: Hemoglobin 10.4, MCV 80, RDW 16.4, platelets 142 01/29/2023: Hemoglobin 12.1, MCV 78.2, iron saturation 25%, ferritin 36  I discussed with the patient that even though she only had 1 dose of Feraheme iron studies looked significantly better. She cannot tolerate IV Duncan Dull (she tells me that in 2018 she had welts and alcohol smell) I briefly discussed that she has slightly below normal white blood cell count  related to low neutrophils.  Nothing needs to be done for this workup. She started taking omega-3 and B12 and liquid iron supplementation. Recheck labs in 3 months and telephone visit 2 days later to discuss results   I discussed the assessment and treatment plan with the patient. The patient was provided an opportunity to ask questions and all were answered. The patient agreed with the plan and demonstrated an understanding of the instructions. The patient was advised to call back or seek an in-person evaluation if the symptoms worsen or if the condition fails to improve as anticipated.   I provided 12 minutes of non-face-to-face time during this encounter.  This includes time for charting and coordination of care   Tamsen Meek, MD  I Janan Ridge am acting as a scribe for Dr.   I have reviewed the above documentation for accuracy and completeness, and I agree with the above.

## 2023-02-01 ENCOUNTER — Telehealth: Payer: 59 | Admitting: Hematology and Oncology

## 2023-02-01 ENCOUNTER — Inpatient Hospital Stay (HOSPITAL_BASED_OUTPATIENT_CLINIC_OR_DEPARTMENT_OTHER): Payer: 59 | Admitting: Hematology and Oncology

## 2023-02-01 DIAGNOSIS — D508 Other iron deficiency anemias: Secondary | ICD-10-CM | POA: Diagnosis not present

## 2023-02-01 NOTE — Assessment & Plan Note (Signed)
Gastric bypass surgery   IV iron: 2017, 2019, Feraheme 1 dose 01/01/2023 Lab review:   12/22/2019: Hemoglobin 10.2, MCV 73, RDW 17.2, platelets 172 11/30/2022: Hemoglobin 10.4, MCV 80, RDW 16.4, platelets 142 01/29/2023: Hemoglobin 12.1, MCV 78.2, iron saturation 25%, ferritin 36  I discussed with the patient that even though she only had 1 dose of Feraheme iron studies looked significantly better.  Recheck labs in 3 months and telephone visit 2 days later to discuss results

## 2023-03-08 ENCOUNTER — Ambulatory Visit: Payer: 59 | Attending: Neurology | Admitting: Physical Therapy

## 2023-03-18 ENCOUNTER — Encounter: Payer: Self-pay | Admitting: Physical Therapy

## 2023-03-18 ENCOUNTER — Other Ambulatory Visit: Payer: Self-pay

## 2023-03-18 ENCOUNTER — Ambulatory Visit: Payer: 59 | Admitting: Physical Therapy

## 2023-03-18 VITALS — BP 150/104 | HR 71

## 2023-03-18 DIAGNOSIS — M5459 Other low back pain: Secondary | ICD-10-CM

## 2023-03-18 NOTE — Therapy (Addendum)
[facility] [address] [place], [place], [address] Phone: [phone]   Fax:  [phone]  Patient Details  Name: Michelle Molina MRN: [id number] Date of Birth: Dec 24, 1965 Referring Provider:  Curt Bears, MD  Encounter Date: 03/18/2023  Patient arrive no charge; patient's BP was out of range. Patient reports she forgot to take her BP medication last night but does have it home. Patient otherwise asymptomatic. Educated on when to go to ED if becomes symptomatic and to follow up with PCP.   Vitals:   03/18/23 1313 03/18/23 1314  BP: (!) 147/102 (!) 150/104  Pulse: 71 71     Carmelia Bake, PT, DPT 03/18/2023, 1:28 PM  [facility] [address] [place], [place], [address] Phone: [phone]   Fax:  [phone]

## 2023-03-27 ENCOUNTER — Telehealth: Payer: Self-pay | Admitting: Hematology and Oncology

## 2023-04-01 ENCOUNTER — Inpatient Hospital Stay: Payer: 59

## 2023-04-02 ENCOUNTER — Inpatient Hospital Stay: Payer: 59 | Attending: Hematology and Oncology

## 2023-04-02 DIAGNOSIS — D508 Other iron deficiency anemias: Secondary | ICD-10-CM

## 2023-04-02 DIAGNOSIS — D509 Iron deficiency anemia, unspecified: Secondary | ICD-10-CM | POA: Insufficient documentation

## 2023-04-02 LAB — CBC WITH DIFFERENTIAL (CANCER CENTER ONLY)
Abs Immature Granulocytes: 0.01 10*3/uL (ref 0.00–0.07)
Basophils Absolute: 0 10*3/uL (ref 0.0–0.1)
Basophils Relative: 1 %
Eosinophils Absolute: 0.1 10*3/uL (ref 0.0–0.5)
Eosinophils Relative: 3 %
HCT: 40.1 % (ref 36.0–46.0)
Hemoglobin: 13.5 g/dL (ref 12.0–15.0)
Immature Granulocytes: 0 %
Lymphocytes Relative: 55 %
Lymphs Abs: 1.9 10*3/uL (ref 0.7–4.0)
MCH: 25.9 pg — ABNORMAL LOW (ref 26.0–34.0)
MCHC: 33.7 g/dL (ref 30.0–36.0)
MCV: 76.8 fL — ABNORMAL LOW (ref 80.0–100.0)
Monocytes Absolute: 0.3 10*3/uL (ref 0.1–1.0)
Monocytes Relative: 7 %
Neutro Abs: 1.2 10*3/uL — ABNORMAL LOW (ref 1.7–7.7)
Neutrophils Relative %: 34 %
Platelet Count: 171 10*3/uL (ref 150–400)
RBC: 5.22 MIL/uL — ABNORMAL HIGH (ref 3.87–5.11)
RDW: 17.7 % — ABNORMAL HIGH (ref 11.5–15.5)
WBC Count: 3.5 10*3/uL — ABNORMAL LOW (ref 4.0–10.5)
nRBC: 0 % (ref 0.0–0.2)

## 2023-04-02 LAB — IRON AND IRON BINDING CAPACITY (CC-WL,HP ONLY)
Iron: 105 ug/dL (ref 28–170)
Saturation Ratios: 27 % (ref 10.4–31.8)
TIBC: 391 ug/dL (ref 250–450)
UIBC: 286 ug/dL (ref 148–442)

## 2023-04-02 LAB — FERRITIN: Ferritin: 11 ng/mL (ref 11–307)

## 2023-04-04 ENCOUNTER — Inpatient Hospital Stay: Payer: 59 | Admitting: Hematology and Oncology

## 2023-04-04 DIAGNOSIS — D508 Other iron deficiency anemias: Secondary | ICD-10-CM

## 2023-04-04 NOTE — Assessment & Plan Note (Signed)
Gastric bypass surgery   IV iron: 2017, 2019, Feraheme 1 dose 01/01/2023 (couldn't tolerate: chemical Smell , nose dryness, tingling mouth, speech difficulty) Lab review:   12/22/2019: Hemoglobin 10.2, MCV 73, RDW 17.2, platelets 172 11/30/2022: Hemoglobin 10.4, MCV 80, RDW 16.4, platelets 142 01/29/2023: Hemoglobin 12.1, MCV 78.2, iron saturation 25%, ferritin 36 04/02/2023: Hemoglobin 13.5, MCV 76.8, iron saturation 27%, ferritin 11  Although the iron levels are low her hemoglobin and her iron saturation are excellent.  Therefore I recommended that she continue with oral iron replacement therapy She cannot tolerate IV Feraheme (she tells me that in 2018 she had welts and alcohol smell)  27-month lab and follow-up with a telephone visit

## 2023-04-05 ENCOUNTER — Encounter: Payer: Self-pay | Admitting: Hematology and Oncology

## 2023-04-05 NOTE — Progress Notes (Signed)
This encounter was created in error - please disregard.

## 2023-04-12 ENCOUNTER — Encounter: Payer: Self-pay | Admitting: Physical Therapy

## 2023-04-12 ENCOUNTER — Ambulatory Visit: Payer: 59 | Attending: Neurology | Admitting: Physical Therapy

## 2023-04-12 ENCOUNTER — Other Ambulatory Visit: Payer: Self-pay

## 2023-04-12 VITALS — BP 140/95 | HR 63

## 2023-04-12 DIAGNOSIS — R2681 Unsteadiness on feet: Secondary | ICD-10-CM | POA: Diagnosis present

## 2023-04-12 DIAGNOSIS — R2689 Other abnormalities of gait and mobility: Secondary | ICD-10-CM | POA: Insufficient documentation

## 2023-04-12 DIAGNOSIS — M5459 Other low back pain: Secondary | ICD-10-CM | POA: Insufficient documentation

## 2023-04-12 DIAGNOSIS — M25551 Pain in right hip: Secondary | ICD-10-CM | POA: Diagnosis present
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
## 2023-04-18 ENCOUNTER — Ambulatory Visit: Payer: 59 | Admitting: Physical Therapy

## 2023-04-18 ENCOUNTER — Encounter: Payer: Self-pay | Admitting: Physical Therapy

## 2023-04-18 VITALS — BP 132/95 | HR 65

## 2023-04-18 DIAGNOSIS — M5459 Other low back pain: Secondary | ICD-10-CM | POA: Diagnosis not present

## 2023-04-18 DIAGNOSIS — R2681 Unsteadiness on feet: Secondary | ICD-10-CM

## 2023-04-18 DIAGNOSIS — M25551 Pain in right hip: Secondary | ICD-10-CM

## 2023-04-18 DIAGNOSIS — R2689 Other abnormalities of gait and mobility: Secondary | ICD-10-CM

## 2023-04-18 NOTE — Therapy (Signed)
OUTPATIENT PHYSICAL THERAPY THORACOLUMBAR TREATMENT   Patient Name: Michelle Molina MRN: [id number] DOB:11-May-1966, 57 y.o., female Today's Date: 04/18/2023  END OF SESSION:  PT End of Session - 04/18/23 1109     Visit Number 2    Number of Visits 7    Date for PT Re-Evaluation 06/07/23    Authorization Type [employer]    PT Start Time 1107    PT Stop Time 1145    PT Time Calculation (min) 38 min    Equipment Utilized During Treatment Gait belt    Activity Tolerance Patient tolerated treatment well    Behavior During Therapy WFL for tasks assessed/performed             Past Medical History:  Diagnosis Date   Allergy    Anemia    GERD (gastroesophageal reflux disease)    Hiatal hernia    Migraines    PONV (postoperative nausea and vomiting)    Sickle cell trait (HCC)    Ulcer    Past Surgical History:  Procedure Laterality Date   CESAREAN SECTION  1997, 1999   x2   CHOLECYSTECTOMY  1997   COLONOSCOPY     GASTRIC BYPASS  2013   HERNIA REPAIR     hiatal hernia   HYSTEROSCOPY  2006   KNEE SURGERY  2001   left   MYOMECTOMY  2010   SALPINGECTOMY Left    SMALL INTESTINE SURGERY     TOTAL HIP ARTHROPLASTY Right 11/28/2022   Procedure: TOTAL HIP ARTHROPLASTY ANTERIOR APPROACH;  Surgeon: Samson Frederic, MD;  Location: [facility];  Service: Orthopedics;  Laterality: Right;  150   UPPER GI ENDOSCOPY     "several"   Patient Active Problem List   Diagnosis Date Noted   Osteoarthritis of right hip 11/28/2022   Primary osteoarthritis of right hip 11/28/2022   Athetosis 03/30/2020   Dystonia of foot 03/30/2020   Thiamine deficiency 09/15/2017   Routine general medical examination at a health care facility 09/10/2017   Iron deficiency anemia 06/17/2013   Status post gastric bypass for obesity 06/16/2013   Depressed 11/28/2011    PCP: Carmela Rima  REFERRING PROVIDER: Curt Bears, MD  REFERRING DIAG: M54.40 (ICD-10-CM) - Lumbago with sciatica,  unspecified side  Rationale for Evaluation and Treatment: Rehabilitation  THERAPY DIAG:  Other low back pain  Other abnormalities of gait and mobility  Pain in right hip  Unsteadiness on feet  ONSET DATE: 03/05/2023 (referral)   SUBJECTIVE:  SUBJECTIVE STATEMENT: Patient reports that her back is overall doing alright today but her hip is hurting due to going to the gym. Reports that in general she is doing well and wanting to work on getting stronger. Denies falls and near falls.  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 Yes: NPRS scale: 4/10 Pain location: R hip Pain description: achy Aggravating factors: carrying heavy equipment at work, getting out bed in morning, carrying groceries up out of stairs Relieving factors: pain medication, ice/heating pack  PRECAUTIONS: Since hip surgery - avoid treadmill and going up steep hills   RED FLAGS: None   WEIGHT BEARING RESTRICTIONS: No  FALLS:  Has patient fallen in last 6 months? No  LIVING ENVIRONMENT: Lives with: lives with an adult companion Lives in: House/apartment Stairs: Yes: External: 15-17 steps; can reach both Has following equipment at home:  walker, cane, booster seat for toilet  OCCUPATION: Biochemist, clinical  PLOF: Independent  PATIENT GOALS: "To get the pain where it is bearable, and I can function and opperate. I would like to be able to sleep comfortably."   NEXT MD VISIT: February with PCP, sees surgeon for hip next month   OBJECTIVE:  Note: Objective measures were completed at Evaluation unless otherwise noted.  DIAGNOSTIC FINDINGS:  IMPRESSION: 1. Moderate to advanced right hip  joint degenerative changes for age. 2. No stress fracture or AVN. 3. Degenerated and torn anterior superior labrum. 4. Moderate right-sided peritendinitis without trochanteric bursitis. 5. Multiple uterine fibroids.  PATIENT SURVEYS:  Modified Oswestry 40% Impairment   SCREENING FOR RED FLAGS: Bowel or bladder incontinence: No Spinal tumors: No Cauda equina syndrome: No Compression fracture: No Abdominal aneurysm: No  COGNITION: Overall cognitive status: Within functional limits for tasks assessed    TODAY'S TREATMENT:                                                                                                                               TherEx:  SciFit level 2 set on twin peak setting pushed chair back to minimize hip flexion performed x 6 min  Performed with green band - Standing Hip Abduction with Resistance at Ankles and Counter Support  - 10 reps - Standing Hip Extension with Resistance at Ankles and Counter Support  - 10 reps - Side Stepping with Resistance at Ankles and Counter Support   - 3 sets x 10 feet - Standing Clam with Resistance Loop 2 sets - 10 reps   Gait:  STAIRS:  Level of Assistance: SBA Stair Negotiation Technique: Alternating Pattern  with Bilateral Rails  Number of Stairs: 2 x 4 steps   Height of Stairs: 6"  Comments: reports sometimes has to due step to if pain is really bad  GAIT: Gait pattern: step through pattern, antalgic, trendelenburg, lateral hip instability, and lateral lean- Left  Distance walked: various clinic distances Assistive device utilized: None Level of assistance: SBA  PATIENT EDUCATION:  Education details:  Initial HEP Person educated: Patient Education method: Chief Technology [employer] Education comprehension: verbalized understanding  HOME EXERCISE PROGRAM: Access Code: [email or web address] URL: [email or web address] Date: 04/18/2023 Prepared by: Maryruth Eve  Exercises - Standing Hip Abduction with  Resistance at Ankles and Counter Support  - 1 x daily - 7 x weekly - 3 sets - 10 reps - Standing Hip Extension with Resistance at Ankles and Counter Support  - 1 x daily - 7 x weekly - 3 sets - 10 reps - Side Stepping with Resistance at Ankles and Counter Support  - 1 x daily - 7 x weekly - 3 sets - 10 reps - Standing Clam with Resistance Loop  - 1 x daily - 7 x weekly - 3 sets - 10 reps  ASSESSMENT:  CLINICAL IMPRESSION: Skilled PT session emphasized work on stair assessment, warmup on SciFit, and creation of initial HEP. Patient tolerated session well without major increase in pain. Single leg stability more challenging on RLE than LLE based on today's findings as expected. Continue POC.    OBJECTIVE IMPAIRMENTS: Abnormal gait, decreased balance, decreased mobility, difficulty walking, decreased ROM, decreased strength, and pain.   ACTIVITY LIMITATIONS: carrying, squatting, sleeping, stairs, and locomotion level  PARTICIPATION LIMITATIONS: meal prep, cleaning, community activity, occupation, and yard work  PERSONAL FACTORS: Time since onset of injury/illness/exacerbation and 1-2 comorbidities: see above  are also affecting patient's functional outcome.   REHAB POTENTIAL: Good  CLINICAL DECISION MAKING: Stable/uncomplicated  EVALUATION COMPLEXITY: Low   GOALS: Goals reviewed with patient? Yes  SHORT TERM GOALS: Target date: 05/03/2023  Patient will demonstrate independence with initial HEP to continue to progress between physical therapy sessions.   Baseline: To be provided Goal status: INITIAL   LONG TERM GOALS: Target date: 05/24/2023  Patient will report demonstrate independence with final HEP in order to maintain current gains and continue to progress after physical therapy discharge.   Baseline: To be provided Goal status: INITIAL  2.  Patient will improve their 5x Sit to Stand score to less than 14 seconds to demonstrate a decreased risk for falls and improved LE  strength.   Baseline: 17.34 seconds Goal status: INITIAL  3.  Patient will demonstrate ability to go up and down 2 x 4 stairs with use of single rail, step through pattern, and SBA in order for easier access into home. Baseline: 2 x 4 stairs step through with bilateral rails Goal status: INITIAL  4.  Patient will improve modified ODI score to 30% indicate a clinically important improvement in low back pain.   Baseline: 40% Goal status: INITIAL  PLAN:  PT FREQUENCY: 1x/week  PT DURATION: 6 weeks  PLANNED INTERVENTIONS: 97164- PT Re-evaluation, 97110-Therapeutic exercises, 97530- Therapeutic activity, [id number]- Neuromuscular re-education, 97535- Self Care, 66440- Manual therapy, [id number]- Gait training, [phone]- Aquatic Therapy, and Dry Needling.  PLAN FOR NEXT SESSION: assess for leg length discrepancy, work on proximal stability and lumbar and hip exercise as tolerated with extension bias as tolerated, work on updating HEP  Carmelia Bake, PT, DPT 04/18/2023, 1:52 PM

## 2023-04-25 ENCOUNTER — Ambulatory Visit: Payer: 59 | Admitting: Physical Therapy

## 2023-04-25 ENCOUNTER — Encounter: Payer: Self-pay | Admitting: Physical Therapy

## 2023-04-25 VITALS — BP 122/94 | HR 67

## 2023-04-25 DIAGNOSIS — R2681 Unsteadiness on feet: Secondary | ICD-10-CM

## 2023-04-25 DIAGNOSIS — M25551 Pain in right hip: Secondary | ICD-10-CM

## 2023-04-25 DIAGNOSIS — M5459 Other low back pain: Secondary | ICD-10-CM | POA: Diagnosis not present

## 2023-04-25 DIAGNOSIS — R2689 Other abnormalities of gait and mobility: Secondary | ICD-10-CM

## 2023-04-25 NOTE — Therapy (Signed)
OUTPATIENT PHYSICAL THERAPY THORACOLUMBAR TREATMENT   Patient Name: Michelle Molina MRN: [id number] DOB:[birth year], 57 y.o., female Today's Date: 04/25/2023  END OF SESSION:  PT End of Session - 04/25/23 1105     Visit Number 3    Number of Visits 7    Date for PT Re-Evaluation 06/07/23    Authorization Type [employer]    PT Start Time 1104    PT Stop Time 1145    PT Time Calculation (min) 41 min    Equipment Utilized During Treatment Gait belt    Activity Tolerance Patient tolerated treatment well    Behavior During Therapy WFL for tasks assessed/performed             Past Medical History:  Diagnosis Date   Allergy    Anemia    GERD (gastroesophageal reflux disease)    Hiatal hernia    Migraines    PONV (postoperative nausea and vomiting)    Sickle cell trait (HCC)    Ulcer    Past Surgical History:  Procedure Laterality Date   CESAREAN SECTION  1997, 1999   x2   CHOLECYSTECTOMY  1997   COLONOSCOPY     GASTRIC BYPASS  2013   HERNIA REPAIR     hiatal hernia   HYSTEROSCOPY  2006   KNEE SURGERY  2001   left   MYOMECTOMY  2010   SALPINGECTOMY Left    SMALL INTESTINE SURGERY     TOTAL HIP ARTHROPLASTY Right 11/28/2022   Procedure: TOTAL HIP ARTHROPLASTY ANTERIOR APPROACH;  Surgeon: Samson Frederic, MD;  Location: [facility];  Service: Orthopedics;  Laterality: Right;  150   UPPER GI ENDOSCOPY     "several"   Patient Active Problem List   Diagnosis Date Noted   Osteoarthritis of right hip 11/28/2022   Primary osteoarthritis of right hip 11/28/2022   Athetosis 03/30/2020   Dystonia of foot 03/30/2020   Thiamine deficiency 09/15/2017   Routine general medical examination at a health care facility 09/10/2017   Iron deficiency anemia 06/17/2013   Status post gastric bypass for obesity 06/16/2013   Depressed 11/28/2011    PCP: Carmela Rima  REFERRING PROVIDER: Curt Bears, MD  REFERRING DIAG: M54.40 (ICD-10-CM) - Lumbago with sciatica,  unspecified side  Rationale for Evaluation and Treatment: Rehabilitation  THERAPY DIAG:  Other low back pain  Other abnormalities of gait and mobility  Pain in right hip  Unsteadiness on feet  ONSET DATE: 03/05/2023 (referral)   SUBJECTIVE:  SUBJECTIVE STATEMENT: Patient reports that she had one fall since last since here. She slid out of bed but did not fall hard. Denies any injuries with fall and did not hit her head. Patient was able to get up by herself. She has been working on her exercises at home. Reports some mild soreness with exercises but has since gotten better. She continues to report greatest challenge with stairs.   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 Yes: NPRS scale: 3.5-4/10 Pain location: R hip Pain description: achy Aggravating factors: carrying heavy equipment at work, getting out bed in morning, carrying groceries up out of stairs Relieving factors: pain medication, ice/heating pack  PRECAUTIONS: Since hip surgery - avoid treadmill and going up steep hills   RED FLAGS: None   WEIGHT BEARING RESTRICTIONS: No  FALLS:  Has patient fallen in last 6 months? No  LIVING ENVIRONMENT: Lives with: lives with an adult companion Lives in: House/apartment Stairs: Yes: External: 15-17 steps; can reach both Has following equipment at home:  walker, cane, booster seat for toilet  OCCUPATION: Biochemist, clinical  PLOF: Independent  PATIENT GOALS: "To get the pain where it is bearable, and I can function and opperate. I would like to be able to sleep comfortably."   NEXT MD VISIT: February with PCP, sees surgeon for hip next month    OBJECTIVE:  Note: Objective measures were completed at Evaluation unless otherwise noted.  DIAGNOSTIC FINDINGS:  IMPRESSION: 1. Moderate to advanced right hip joint degenerative changes for age. 2. No stress fracture or AVN. 3. Degenerated and torn anterior superior labrum. 4. Moderate right-sided peritendinitis without trochanteric bursitis. 5. Multiple uterine fibroids.  PATIENT SURVEYS:  Modified Oswestry 40% Impairment   SCREENING FOR RED FLAGS: Bowel or bladder incontinence: No Spinal tumors: No Cauda equina syndrome: No Compression fracture: No Abdominal aneurysm: No  COGNITION: Overall cognitive status: Within functional limits for tasks assessed    TODAY'S TREATMENT:                                                                                                                                Vitals:   04/25/23 1112 04/25/23 1114  BP: (!) 140/101 (!) 122/94  Pulse:  67   TherAct: Vitals assessed as noted above Seated measured on L arm with deep breathing second reading within limits for therapy   TherEx:  SciFit level 4 LE only set on quick start setting pushed chair back to minimize hip flexion performed x 8 min Step ups in // bars (CGA-SBA) 1 x 10 bil with single UE support on 4" step 2 x 10 without UE support bilaterally from 4" step, cues for reducing knee valgus with manual pressure on lateral aspect of R knee 1 x 5 step up on 6" box with single UE use for RLE  1 x 5 step downs on 4" step with eccentric lower and cues to minimize knee valgus (used mirror for visual feedback)  PATIENT EDUCATION:  Education details:  Continue HEP Person educated: Patient Education method: [employer] Technology [employer] Education comprehension: verbalized understanding  HOME EXERCISE PROGRAM: Access Code: [email or web address] URL: [email or web address] Date: 04/18/2023 Prepared by: Maryruth Eve  Exercises - Standing Hip Abduction with Resistance at Ankles and Counter  Support  - 1 x daily - 7 x weekly - 3 sets - 10 reps - Standing Hip Extension with Resistance at Ankles and Counter Support  - 1 x daily - 7 x weekly - 3 sets - 10 reps - Side Stepping with Resistance at Ankles and Counter Support  - 1 x daily - 7 x weekly - 3 sets - 10 reps - Standing Clam with Resistance Loop  - 1 x daily - 7 x weekly - 3 sets - 10 reps  - 6" step ups bilaterally with single UE support 3 x 5-10 pending fatigue  ASSESSMENT:  CLINICAL IMPRESSION: Skilled PT session emphasized hip stability particularly with functional step ups/downs. Practiced on 4" and 6" step. Increased UE involvement required with stance on RLE due to notable hip abductor and eccentric quad weakness resulting in increased knee valgus that patient was able to correct with min-mod cues. Continue POC.    OBJECTIVE IMPAIRMENTS: Abnormal gait, decreased balance, decreased mobility, difficulty walking, decreased ROM, decreased strength, and pain.   ACTIVITY LIMITATIONS: carrying, squatting, sleeping, stairs, and locomotion level  PARTICIPATION LIMITATIONS: meal prep, cleaning, community activity, occupation, and yard work  PERSONAL FACTORS: Time since onset of injury/illness/exacerbation and 1-2 comorbidities: see above  are also affecting patient's functional outcome.   REHAB POTENTIAL: Good  CLINICAL DECISION MAKING: Stable/uncomplicated  EVALUATION COMPLEXITY: Low   GOALS: Goals reviewed with patient? Yes  SHORT TERM GOALS: Target date: 05/03/2023  Patient will demonstrate independence with initial HEP to continue to progress between physical therapy sessions.   Baseline: To be provided Goal status: INITIAL   LONG TERM GOALS: Target date: 05/24/2023  Patient will report demonstrate independence with final HEP in order to maintain current gains and continue to progress after physical therapy discharge.   Baseline: To be provided Goal status: INITIAL  2.  Patient will improve their 5x Sit to  Stand score to less than 14 seconds to demonstrate a decreased risk for falls and improved LE strength.   Baseline: 17.34 seconds Goal status: INITIAL  3.  Patient will demonstrate ability to go up and down 2 x 4 stairs with use of single rail, step through pattern, and SBA in order for easier access into home. Baseline: 2 x 4 stairs step through with bilateral rails Goal status: INITIAL  4.  Patient will improve modified ODI score to 30% indicate a clinically important improvement in low back pain.   Baseline: 40% Goal status: INITIAL  PLAN:  PT FREQUENCY: 1x/week  PT DURATION: 6 weeks  PLANNED INTERVENTIONS: 97164- PT Re-evaluation, 97110-Therapeutic exercises, 97530- Therapeutic activity, [id number]- Neuromuscular re-education, 97535- Self Care, [id number]- Manual therapy, [id number]- Gait training, [phone]- Aquatic Therapy, and Dry Needling.  PLAN FOR NEXT SESSION: assess for leg length discrepancy, work on proximal stability and lumbar and hip exercise as tolerated with extension bias as tolerated, work on hip abduction strength and AD as needed to help minimize lateral lean with gait  Carmelia Bake, PT, DPT 04/25/2023, 2:20 PM

## 2023-05-02 ENCOUNTER — Ambulatory Visit: Payer: 59 | Admitting: Physical Therapy

## 2023-05-02 VITALS — BP 157/102 | HR 63

## 2023-05-02 DIAGNOSIS — R2689 Other abnormalities of gait and mobility: Secondary | ICD-10-CM

## 2023-05-02 DIAGNOSIS — M5459 Other low back pain: Secondary | ICD-10-CM | POA: Insufficient documentation

## 2023-05-02 DIAGNOSIS — R2681 Unsteadiness on feet: Secondary | ICD-10-CM

## 2023-05-02 DIAGNOSIS — M25551 Pain in right hip: Secondary | ICD-10-CM | POA: Insufficient documentation

## 2023-05-02 NOTE — Therapy (Addendum)
OUTPATIENT PHYSICAL THERAPY THORACOLUMBAR TREATMENT- ARRIVED NO CHARGE   Patient Name: Michelle Molina MRN: [id number] DOB:[birth year], 57 y.o., female Today's Date: 05/02/2023  END OF SESSION:  PT End of Session - 05/02/23 1102     Visit Number 3   Arrived no charge   Number of Visits 7    Date for PT Re-Evaluation 06/07/23    Authorization Type [employer]    PT Start Time 1100    PT Stop Time 1115   Arrived no charge   PT Time Calculation (min) 15 min    Equipment Utilized During Treatment --    Activity Tolerance Treatment limited secondary to medical complications (Comment)   HTN   Behavior During Therapy WFL for tasks assessed/performed              Past Medical History:  Diagnosis Date   Allergy    Anemia    GERD (gastroesophageal reflux disease)    Hiatal hernia    Migraines    PONV (postoperative nausea and vomiting)    Sickle cell trait (HCC)    Ulcer    Past Surgical History:  Procedure Laterality Date   CESAREAN SECTION  1997, 1999   x2   CHOLECYSTECTOMY  1997   COLONOSCOPY     GASTRIC BYPASS  2013   HERNIA REPAIR     hiatal hernia   HYSTEROSCOPY  2006   KNEE SURGERY  2001   left   MYOMECTOMY  2010   SALPINGECTOMY Left    SMALL INTESTINE SURGERY     TOTAL HIP ARTHROPLASTY Right 11/28/2022   Procedure: TOTAL HIP ARTHROPLASTY ANTERIOR APPROACH;  Surgeon: Samson Frederic, MD;  Location: [facility];  Service: Orthopedics;  Laterality: Right;  150   UPPER GI ENDOSCOPY     "several"   Patient Active Problem List   Diagnosis Date Noted   Osteoarthritis of right hip 11/28/2022   Primary osteoarthritis of right hip 11/28/2022   Athetosis 03/30/2020   Dystonia of foot 03/30/2020   Thiamine deficiency 09/15/2017   Routine general medical examination at a health care facility 09/10/2017   Iron deficiency anemia 06/17/2013   Status post gastric bypass for obesity 06/16/2013   Depressed 11/28/2011    PCP: Carmela Rima  REFERRING PROVIDER:  Curt Bears, MD  REFERRING DIAG: M54.40 (ICD-10-CM) - Lumbago with sciatica, unspecified side  Rationale for Evaluation and Treatment: Rehabilitation  THERAPY DIAG:  Other low back pain  Other abnormalities of gait and mobility  Pain in right hip  Unsteadiness on feet  ONSET DATE: 03/05/2023 (referral)   SUBJECTIVE:  SUBJECTIVE STATEMENT: Patient reports she started taking a new medication for weight loss last Friday, feels okay. Also started a new vitamin. No falls since last visit. States her R hip is sore and numb today. 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 Yes: NPRS scale: 1/10 Pain location: R hip Pain description: achy Aggravating factors: carrying heavy equipment at work, getting out bed in morning, carrying groceries up out of stairs Relieving factors: pain medication, ice/heating pack  PRECAUTIONS: Since hip surgery - avoid treadmill and going up steep hills   RED FLAGS: None   WEIGHT BEARING RESTRICTIONS: No  FALLS:  Has patient fallen in last 6 months? No  LIVING ENVIRONMENT: Lives with: lives with an adult companion Lives in: House/apartment Stairs: Yes: External: 15-17 steps; can reach both Has following equipment at home:  walker, cane, booster seat for toilet  OCCUPATION: Biochemist, clinical  PLOF: Independent  PATIENT GOALS: "To get the pain where it is bearable, and I can function and opperate. I would like to be able to sleep comfortably."   NEXT MD VISIT: February with PCP, sees surgeon for hip next month   OBJECTIVE:  Note: Objective measures  were completed at Evaluation unless otherwise noted.  DIAGNOSTIC FINDINGS:  IMPRESSION: 1. Moderate to advanced right hip joint degenerative changes for age. 2. No stress fracture or AVN. 3. Degenerated and torn anterior superior labrum. 4. Moderate right-sided peritendinitis without trochanteric bursitis. 5. Multiple uterine fibroids.  PATIENT SURVEYS:  Modified Oswestry 40% Impairment   SCREENING FOR RED FLAGS: Bowel or bladder incontinence: No Spinal tumors: No Cauda equina syndrome: No Compression fracture: No Abdominal aneurysm: No  COGNITION: Overall cognitive status: Within functional limits for tasks assessed   VITALS  Vitals:   05/02/23 1107 05/02/23 1109  BP: (!) 160/113 (!) 157/102  Pulse: 65 63      TODAY'S TREATMENT:                                                                                                                               TherAct: Assessed vitals (see above) and diastolic BP too elevated for therapy. Pt reports she did take her BP medication this morning but is wondering if her new vitamin is contributing, as she took two yesterday and "could feel it". Encouraged pt to reach out to PCP to inform them of new vitamin to ensure it is okay to combine w/current medicines. Pt verbalized understanding  Added appointment at end of POC to make up for today's session.   PATIENT EDUCATION:  Education details:  Continue HEP Person educated: Patient Education method: Chief Technology Officer Education comprehension: verbalized understanding  HOME EXERCISE PROGRAM: Access Code: [email or web address] URL: [email or web address] Date: 04/18/2023 Prepared by: Maryruth Eve  Exercises - Standing Hip Abduction with Resistance at Ankles and Counter Support  - 1 x daily - 7 x weekly - 3 sets - 10 reps - Standing Hip Extension with Resistance at Ankles and Counter Support  - 1 x daily - 7 x weekly - 3 sets - 10 reps - Side Stepping with Resistance at  Ankles and Counter Support  - 1 x daily - 7 x weekly - 3 sets - 10 reps - Standing Clam with Resistance Loop  - 1 x daily - 7 x weekly - 3 sets - 10 reps  - 6" step ups bilaterally with single UE support 3 x 5-10 pending fatigue  ASSESSMENT:  CLINICAL IMPRESSION: Arrived no charge due to HTN   OBJECTIVE IMPAIRMENTS: Abnormal gait, decreased balance, decreased mobility, difficulty walking, decreased ROM, decreased strength, and pain.   ACTIVITY LIMITATIONS: carrying, squatting, sleeping, stairs, and locomotion level  PARTICIPATION LIMITATIONS: meal prep, cleaning, community activity, occupation, and yard work  PERSONAL FACTORS: Time since onset of injury/illness/exacerbation and 1-2 comorbidities: see above  are also affecting patient's functional outcome.   REHAB POTENTIAL: Good  CLINICAL DECISION MAKING: Stable/uncomplicated  EVALUATION COMPLEXITY: Low   GOALS: Goals reviewed with patient? Yes  SHORT TERM GOALS: Target date: 05/03/2023  Patient will demonstrate independence with initial HEP to continue to progress between physical therapy sessions.   Baseline: To be provided Goal status: INITIAL   LONG TERM GOALS: Target date: 05/24/2023  Patient will report demonstrate independence with final HEP in order to maintain current gains and continue to progress after physical therapy discharge.   Baseline: To be provided Goal status: INITIAL  2.  Patient will improve their 5x Sit to Stand score to less than 14 seconds to demonstrate a decreased risk for falls and improved LE strength.   Baseline: 17.34 seconds Goal status: INITIAL  3.  Patient will demonstrate ability to go up and down 2 x 4 stairs with use of single rail, step through pattern, and SBA in order for easier access into home. Baseline: 2 x 4 stairs step through with bilateral rails Goal status: INITIAL  4.  Patient will improve modified ODI score to 30% indicate a clinically important improvement in low back  pain.   Baseline: 40% Goal status: INITIAL  PLAN:  PT FREQUENCY: 1x/week  PT DURATION: 6 weeks  PLANNED INTERVENTIONS: 97164- PT Re-evaluation, 97110-Therapeutic exercises, 97530- Therapeutic activity, [id number]- Neuromuscular re-education, 97535- Self Care, [id number]- Manual therapy, [id number]- Gait training, [phone]- Aquatic Therapy, and Dry Needling.  PLAN FOR NEXT SESSION: assess for leg length discrepancy, work on proximal stability and lumbar and hip exercise as tolerated with extension bias as tolerated, work on hip abduction strength and AD as needed to help minimize lateral lean with gait  Kipp Shank E Amedio Bowlby, PT, DPT 05/02/2023, 11:37 AM

## 2023-05-09 ENCOUNTER — Ambulatory Visit: Payer: 59 | Attending: Physician Assistant | Admitting: Physical Therapy

## 2023-05-09 ENCOUNTER — Telehealth: Payer: Self-pay | Admitting: Physical Therapy

## 2023-05-09 VITALS — BP 121/88 | HR 67

## 2023-05-09 DIAGNOSIS — R2689 Other abnormalities of gait and mobility: Secondary | ICD-10-CM | POA: Diagnosis present

## 2023-05-09 DIAGNOSIS — R2681 Unsteadiness on feet: Secondary | ICD-10-CM | POA: Diagnosis present

## 2023-05-09 DIAGNOSIS — M25551 Pain in right hip: Secondary | ICD-10-CM | POA: Diagnosis present

## 2023-05-09 DIAGNOSIS — M5459 Other low back pain: Secondary | ICD-10-CM

## 2023-05-09 NOTE — Therapy (Signed)
OUTPATIENT PHYSICAL THERAPY THORACOLUMBAR TREATMENT  Patient Name: Michelle Molina MRN: [id number] DOB:[birth year], 57 y.o., female Today's Date: 05/09/2023  END OF SESSION:  PT End of Session - 05/09/23 0849     Visit Number 4    Number of Visits 7    Date for PT Re-Evaluation 06/07/23    Authorization Type [employer]    PT Start Time 0848    PT Stop Time 0933    PT Time Calculation (min) 45 min    Activity Tolerance Patient tolerated treatment well    Behavior During Therapy WFL for tasks assessed/performed              Past Medical History:  Diagnosis Date   Allergy    Anemia    GERD (gastroesophageal reflux disease)    Hiatal hernia    Migraines    PONV (postoperative nausea and vomiting)    Sickle cell trait (HCC)    Ulcer    Past Surgical History:  Procedure Laterality Date   CESAREAN SECTION  1997, 1999   x2   CHOLECYSTECTOMY  1997   COLONOSCOPY     GASTRIC BYPASS  2013   HERNIA REPAIR     hiatal hernia   HYSTEROSCOPY  2006   KNEE SURGERY  2001   left   MYOMECTOMY  2010   SALPINGECTOMY Left    SMALL INTESTINE SURGERY     TOTAL HIP ARTHROPLASTY Right 11/28/2022   Procedure: TOTAL HIP ARTHROPLASTY ANTERIOR APPROACH;  Surgeon: Samson Frederic, MD;  Location: [facility];  Service: Orthopedics;  Laterality: Right;  150   UPPER GI ENDOSCOPY     "several"   Patient Active Problem List   Diagnosis Date Noted   Osteoarthritis of right hip 11/28/2022   Primary osteoarthritis of right hip 11/28/2022   Athetosis 03/30/2020   Dystonia of foot 03/30/2020   Thiamine deficiency 09/15/2017   Routine general medical examination at a health care facility 09/10/2017   Iron deficiency anemia 06/17/2013   Status post gastric bypass for obesity 06/16/2013   Depressed 11/28/2011    PCP: Carmela Rima  REFERRING PROVIDER: Curt Bears, MD  REFERRING DIAG: M54.40 (ICD-10-CM) - Lumbago with sciatica, unspecified side  Rationale for Evaluation and  Treatment: Rehabilitation  THERAPY DIAG:  Other abnormalities of gait and mobility  Pain in right hip  Unsteadiness on feet  Other low back pain  ONSET DATE: 03/05/2023 (referral)   SUBJECTIVE:  SUBJECTIVE STATEMENT: Patient reports she stopped taking the new vitamins. Having more pain in her hip today, thinks it may be the weather.   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 Yes: NPRS scale: 5/10 Pain location: R hip Pain description: achy Aggravating factors: carrying heavy equipment at work, getting out bed in morning, carrying groceries up out of stairs Relieving factors: pain medication, ice/heating pack  PRECAUTIONS: Since hip surgery - avoid treadmill and going up steep hills   RED FLAGS: None   WEIGHT BEARING RESTRICTIONS: No  FALLS:  Has patient fallen in last 6 months? No  LIVING ENVIRONMENT: Lives with: lives with an adult companion Lives in: House/apartment Stairs: Yes: External: 15-17 steps; can reach both Has following equipment at home:  walker, cane, booster seat for toilet  OCCUPATION: Biochemist, clinical  PLOF: Independent  PATIENT GOALS: "To get the pain where it is bearable, and I can function and opperate. I would like to be able to sleep comfortably."   NEXT MD VISIT: February with PCP, sees surgeon for hip next month   OBJECTIVE:  Note: Objective measures were completed at Evaluation unless otherwise noted.  DIAGNOSTIC FINDINGS:  IMPRESSION: 1. Moderate to advanced right hip joint degenerative changes for age. 2. No stress fracture or AVN. 3. Degenerated and torn anterior superior  labrum. 4. Moderate right-sided peritendinitis without trochanteric bursitis. 5. Multiple uterine fibroids.  PATIENT SURVEYS:  Modified Oswestry 40% Impairment   SCREENING FOR RED FLAGS: Bowel or bladder incontinence: No Spinal tumors: No Cauda equina syndrome: No Compression fracture: No Abdominal aneurysm: No  COGNITION: Overall cognitive status: Within functional limits for tasks assessed   VITALS  Vitals:   05/09/23 0857  BP: 121/88  Pulse: 67       TODAY'S TREATMENT:                                                                                                                               Ther Act: Assessed vitals (see above) and WNL this date Assessed R hip as pt reports she feels "knots" along incision site that are painful and swell up. Informed pt that she is feeling scar tissue, likely where she was stitched internally. Educated pt on how to perform scar mobilization and using a scraping tool to assist breaking up the adhesions. Pt verbalized understanding.  Assessed leg length in supine x2 and noted RLE longer than LLE. Assessed bilateral ASIS and R ASIS anteriorly rotated.  Assessed ASIS positioning in standing and R ASIS significantly posteriorly rotated. Pt reported feeling "off" when standing.  Pt reports she does leak urine when coughing, laughing or jumping and is experiencing urinary urgency. Informed pt that she likely has a pelvic floor dysfunction and will benefit more from pelvic floor PT. Pt in agreement with this.  Ther Ex  Supine MET to SIJ using PVC pipe, 4x10s holds per side. Added to HEP (see bolded below)  Supine bicycles w/resistance around feet (red theraband) w/emphasis on TA activation, 3x8 reps per side. Pt unable to maintain TA contraction despite max tactile and verbal cues  PPT x10 reps   PATIENT EDUCATION:  Education details:  Continue HEP, plan to acquire referral for pelvic floor PT  Person educated: Patient Education method:  Explanation and Handouts Education comprehension: verbalized understanding  HOME EXERCISE PROGRAM: Access Code: [email or web address] URL: [email or web address] Date: 04/18/2023 Prepared by: Maryruth Eve  Exercises - Standing Hip Abduction with Resistance at Ankles and Counter Support  - 1 x daily - 7 x weekly - 3 sets - 10 reps - Standing Hip Extension with Resistance at Ankles and Counter Support  - 1 x daily - 7 x weekly - 3 sets - 10 reps - Side Stepping with Resistance at Ankles and Counter Support  - 1 x daily - 7 x weekly - 3 sets - 10 reps - Standing Clam with Resistance Loop  - 1 x daily - 7 x weekly - 3 sets - 10 reps - 6" step ups bilaterally with single UE support 3 x 5-10 pending fatigue - Supine SI Joint Self-Correction  - 1 x daily - 7 x weekly - 4 sets - 10 second hold  ASSESSMENT:  CLINICAL IMPRESSION: Emphasis of skilled PT session on assessing pt's pelvic positioning and leg length, core stability and hip flexor strength. Pt demonstrates anterior rotation of R innominate when in supine but significant posterior rotation in standing. Attempted MET to SIJ w/no change in pelvic position noted. At this time, pt and therapist in agreement that pt will benefit from pelvic floor PT to assess pelvic instability and core weakness. Therapist to acquire pelvic PT referral. Continue POC.   OBJECTIVE IMPAIRMENTS: Abnormal gait, decreased balance, decreased mobility, difficulty walking, decreased ROM, decreased strength, and pain.   ACTIVITY LIMITATIONS: carrying, squatting, sleeping, stairs, and locomotion level  PARTICIPATION LIMITATIONS: meal prep, cleaning, community activity, occupation, and yard work  PERSONAL FACTORS: Time since onset of injury/illness/exacerbation and 1-2 comorbidities: see above  are also affecting patient's functional outcome.   REHAB POTENTIAL: Good  CLINICAL DECISION MAKING: Stable/uncomplicated  EVALUATION COMPLEXITY: Low   GOALS: Goals  reviewed with patient? Yes  SHORT TERM GOALS: Target date: 05/03/2023  Patient will demonstrate independence with initial HEP to continue to progress between physical therapy sessions.   Baseline: To be provided Goal status: INITIAL   LONG TERM GOALS: Target date: 05/24/2023  Patient will report demonstrate independence with final HEP in order to maintain current gains and continue to progress after physical therapy discharge.   Baseline: To be provided Goal status: INITIAL  2.  Patient will improve their 5x Sit to Stand score to less than 14 seconds to demonstrate a decreased risk for falls and improved LE strength.   Baseline: 17.34 seconds Goal status: INITIAL  3.  Patient will demonstrate ability to go up and down 2 x 4 stairs with use of single rail, step through pattern, and SBA in order for easier access into home. Baseline: 2 x 4 stairs step through with bilateral rails Goal status: INITIAL  4.  Patient will improve modified ODI score to 30% indicate a clinically important improvement in low back pain.   Baseline: 40% Goal status: INITIAL  PLAN:  PT FREQUENCY: 1x/week  PT DURATION: 6 weeks  PLANNED INTERVENTIONS: 97164- PT Re-evaluation, 97110-Therapeutic exercises, 97530-  Therapeutic activity, [id number]- Neuromuscular re-education, [id number]- Self Care, 16109- Manual therapy, [id number]- Gait training, [id number]- Aquatic Therapy, and Dry Needling.  PLAN FOR NEXT SESSION: Pelvic PT?  work on proximal stability and lumbar and hip exercise as tolerated with extension bias as tolerated, work on hip abduction strength and AD as needed to help minimize lateral lean with gait  Elvis Boot E Xiamara Hulet, PT, DPT 05/09/2023, 9:34 AM

## 2023-05-09 NOTE — Telephone Encounter (Signed)
FAXED ON 05/09/23  Dr. Ralph Leyden,   Michelle Molina is being seen by physical therapy for right hip pain.The patient would benefit from a pelvic floor PT evaluation for pelvic floor dysfunction.    If you agree, please place an order in [facility] workque in [facility] or fax the order to [phone].  Thank you, Josephine Igo, PT, DPT  [facility] [address] [place], [place]  [address] Phone:  [phone] Fax:  [phone]

## 2023-05-21 ENCOUNTER — Ambulatory Visit: Payer: 59 | Admitting: Physical Therapy

## 2023-05-21 ENCOUNTER — Encounter: Payer: Self-pay | Admitting: Physical Therapy

## 2023-05-21 VITALS — BP 138/92 | HR 70

## 2023-05-21 DIAGNOSIS — R2689 Other abnormalities of gait and mobility: Secondary | ICD-10-CM | POA: Diagnosis not present

## 2023-05-21 DIAGNOSIS — R2681 Unsteadiness on feet: Secondary | ICD-10-CM

## 2023-05-21 DIAGNOSIS — M5459 Other low back pain: Secondary | ICD-10-CM

## 2023-05-21 DIAGNOSIS — M25551 Pain in right hip: Secondary | ICD-10-CM

## 2023-05-21 NOTE — Therapy (Signed)
OUTPATIENT PHYSICAL THERAPY THORACOLUMBAR TREATMENT  Patient Name: QUAN SIERRA MRN: [id number] DOB:[birth year], 57 y.o., female Today's Date: 05/21/2023  END OF SESSION:  PT End of Session - 05/21/23 1020     Visit Number 5    Number of Visits 7    Date for PT Re-Evaluation 06/07/23    Authorization Type [employer]    PT Start Time 1020    PT Stop Time 1100    PT Time Calculation (min) 40 min    Equipment Utilized During Treatment Gait belt    Activity Tolerance Patient tolerated treatment well    Behavior During Therapy WFL for tasks assessed/performed              Past Medical History:  Diagnosis Date   Allergy    Anemia    GERD (gastroesophageal reflux disease)    Hiatal hernia    Migraines    PONV (postoperative nausea and vomiting)    Sickle cell trait (HCC)    Ulcer    Past Surgical History:  Procedure Laterality Date   CESAREAN SECTION  1997, 1999   x2   CHOLECYSTECTOMY  1997   COLONOSCOPY     GASTRIC BYPASS  2013   HERNIA REPAIR     hiatal hernia   HYSTEROSCOPY  2006   KNEE SURGERY  2001   left   MYOMECTOMY  2010   SALPINGECTOMY Left    SMALL INTESTINE SURGERY     TOTAL HIP ARTHROPLASTY Right 11/28/2022   Procedure: TOTAL HIP ARTHROPLASTY ANTERIOR APPROACH;  Surgeon: Samson Frederic, MD;  Location: [facility];  Service: Orthopedics;  Laterality: Right;  150   UPPER GI ENDOSCOPY     "several"   Patient Active Problem List   Diagnosis Date Noted   Osteoarthritis of right hip 11/28/2022   Primary osteoarthritis of right hip 11/28/2022   Athetosis 03/30/2020   Dystonia of foot 03/30/2020   Thiamine deficiency 09/15/2017   Routine general medical examination at a health care facility 09/10/2017   Iron deficiency anemia 06/17/2013   Status post gastric bypass for obesity 06/16/2013   Depressed 11/28/2011    PCP: Carmela Rima  REFERRING PROVIDER: Curt Bears, MD  REFERRING DIAG: M54.40 (ICD-10-CM) - Lumbago with sciatica,  unspecified side  Rationale for Evaluation and Treatment: Rehabilitation  THERAPY DIAG:  Other abnormalities of gait and mobility  Pain in right hip  Unsteadiness on feet  Other low back pain  ONSET DATE: 03/05/2023 (referral)   SUBJECTIVE:  SUBJECTIVE STATEMENT: Patient reports she that she is not able to get in to pelvic health PT until February. Patient reports that she did get pulled down the stairs by her dog and kind of pulled her leg with the slide. She reports that she has a lot of pain since then.   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 Yes: NPRS scale: 4.5-5/10 Pain location: R hip Pain description: achy Aggravating factors: carrying heavy equipment at work, getting out bed in morning, carrying groceries up out of stairs Relieving factors: pain medication, ice/heating pack  PRECAUTIONS: Since hip surgery - avoid treadmill and going up steep hills   RED FLAGS: None   WEIGHT BEARING RESTRICTIONS: No  FALLS:  Has patient fallen in last 6 months? No  LIVING ENVIRONMENT: Lives with: lives with an adult companion Lives in: House/apartment Stairs: Yes: External: 15-17 steps; can reach both Has following equipment at home:  walker, cane, booster seat for toilet  OCCUPATION: Biochemist, clinical  PLOF: Independent  PATIENT GOALS: "To get the pain where it is bearable, and I can function and opperate. I would like to be able to sleep comfortably."   NEXT MD VISIT: February with PCP, sees surgeon for hip next month   OBJECTIVE:  Note: Objective measures were completed at Evaluation unless otherwise  noted.  DIAGNOSTIC FINDINGS:  IMPRESSION: 1. Moderate to advanced right hip joint degenerative changes for age. 2. No stress fracture or AVN. 3. Degenerated and torn anterior superior labrum. 4. Moderate right-sided peritendinitis without trochanteric bursitis. 5. Multiple uterine fibroids.  PATIENT SURVEYS:  Modified Oswestry 40% Impairment   SCREENING FOR RED FLAGS: Bowel or bladder incontinence: No Spinal tumors: No Cauda equina syndrome: No Compression fracture: No Abdominal aneurysm: No  COGNITION: Overall cognitive status: Within functional limits for tasks assessed     TODAY'S TREATMENT:                                                                                                                               Vitals:   05/21/23 1037  BP: (!) 138/92  Pulse: 70  Seated on R arm  Ther Act: Assessed vitals (see above) and elevated but WNL this date Reassessed R hip near scar incision and reduced scar tissue noted in today's session likely due to ongoing mobilization at homes Assessed leg length in supine x2 and noted RLE longer than LLE; slight anterior rotation noted of ASIS similar to what noted prior session.  Assessed ASIS positioning in standing with no major discrpeancy noted on this date but did continue to notice mild trendelenburg compensation with gait assessment  Continue to recommend follow back up with PCP to see if patient can get in sooner for a pelvic health exam   Ther Ex  Supine MET to SIJ  replicating type used on HEP  4x10s holds per side.  Bridging with abduction 1 x 10 with 3" holds Resisted hip abduction in hooklying 15 x 5" holds   PATIENT EDUCATION:  Education details:  Continue HEP Person educated: Patient Education method: [employer] Education comprehension: verbalized understanding  HOME EXERCISE PROGRAM: Access Code: [email or web address] URL: [email or web address] Date: 04/18/2023 Prepared by: Maryruth Eve  Exercises - Standing Hip Abduction with Resistance at Ankles and Counter Support  - 1 x daily - 7 x weekly - 3 sets - 10 reps - Standing Hip Extension with Resistance at Ankles and Counter Support  - 1 x daily - 7 x weekly - 3 sets - 10 reps - Side Stepping with Resistance at Ankles and Counter Support  - 1 x daily - 7 x weekly - 3 sets - 10 reps - Standing Clam with Resistance Loop  - 1 x daily - 7 x weekly - 3 sets - 10 reps - 6" step ups bilaterally with single UE support 3 x 5-10 pending fatigue - Supine SI Joint Self-Correction  - 1 x daily - 7 x weekly - 4 sets - 10 second hold  ASSESSMENT:  CLINICAL IMPRESSION: Emphasis of skilled PT session on assessing pt's pelvic positioning and leg length and hip strength. Patient pelvic assymetry more mild than last session but does appear to be a contributor to gait deficits. Given change noted last session likely muscle strength deficits could be contributing. Continue to advise follow up for pelvic PT at this time and continued work on hip stability. Continue POC.   OBJECTIVE IMPAIRMENTS: Abnormal gait, decreased balance, decreased mobility, difficulty walking, decreased ROM, decreased strength, and pain.   ACTIVITY LIMITATIONS: carrying, squatting, sleeping, stairs, and locomotion level  PARTICIPATION LIMITATIONS: meal prep, cleaning, community activity, occupation, and yard work  PERSONAL FACTORS: Time since onset of injury/illness/exacerbation and 1-2 comorbidities: see above  are also affecting patient's functional outcome.   REHAB POTENTIAL: Good  CLINICAL DECISION MAKING: Stable/uncomplicated  EVALUATION COMPLEXITY: Low   GOALS: Goals reviewed with patient? Yes  SHORT TERM GOALS: Target date: 05/03/2023  Patient will demonstrate independence with initial HEP to continue to progress between physical therapy sessions.   Baseline: To be provided, reports confidence in HEP Goal status:MET   LONG TERM GOALS: Target date:  05/24/2023  Patient will report demonstrate independence with final HEP in order to maintain current gains and continue to progress after physical therapy discharge.   Baseline: To be provided Goal status: INITIAL  2.  Patient will improve their 5x Sit to Stand score to less than 14 seconds to demonstrate a decreased risk for falls and improved LE strength.   Baseline: 17.34 seconds Goal status: INITIAL  3.  Patient will demonstrate ability to go up and down 2 x 4 stairs with use of single rail, step through pattern, and SBA in order for easier access into home. Baseline: 2 x 4 stairs step through with bilateral rails Goal status: INITIAL  4.  Patient will improve modified ODI score to 30% indicate a clinically important improvement in low back pain.   Baseline: 40% Goal status: INITIAL  PLAN:  PT FREQUENCY: 1x/week  PT DURATION: 6 weeks  PLANNED INTERVENTIONS: 97164- PT Re-evaluation, 97110-Therapeutic exercises, 97530- Therapeutic activity, [id number]- Neuromuscular re-education, 97535- Self Care, 16109- Manual therapy, [id number]- Gait training, [phone]- Aquatic Therapy, and Dry Needling.  PLAN FOR NEXT SESSION: Pelvic PT?  work on proximal stability and lumbar and hip exercise as tolerated with  extension bias as tolerated, work on hip abduction strength and AD as needed to help minimize lateral lean with gait  Carmelia Bake, PT, DPT 05/21/2023, 12:05 PM

## 2023-05-30 ENCOUNTER — Ambulatory Visit: Payer: 59 | Attending: Physician Assistant | Admitting: Physical Therapy

## 2023-05-30 ENCOUNTER — Encounter: Payer: Self-pay | Admitting: Physical Therapy

## 2023-05-30 VITALS — BP 114/91 | HR 76

## 2023-05-30 DIAGNOSIS — M25551 Pain in right hip: Secondary | ICD-10-CM | POA: Diagnosis present

## 2023-05-30 DIAGNOSIS — M5459 Other low back pain: Secondary | ICD-10-CM | POA: Insufficient documentation

## 2023-05-30 DIAGNOSIS — R2689 Other abnormalities of gait and mobility: Secondary | ICD-10-CM | POA: Diagnosis present

## 2023-05-30 DIAGNOSIS — R2681 Unsteadiness on feet: Secondary | ICD-10-CM | POA: Diagnosis present

## 2023-05-30 NOTE — Therapy (Signed)
OUTPATIENT PHYSICAL THERAPY THORACOLUMBAR TREATMENT / DISCHARGE   Patient Name: Michelle Molina MRN: [id number] DOB:[birth year], 57 y.o., female Today's Date: 05/30/2023  PHYSICAL THERAPY DISCHARGE SUMMARY  Visits from Start of Care: 6  Current functional level related to goals / functional outcomes: See below   Remaining deficits: Elevated BP and pain, antalgic gait pattern   Education / Equipment: Recommend follow up for BP and pelvic health   Patient agrees to discharge. Patient goals were partially met. Patient is being discharged due to maximized rehab potential.    END OF SESSION:  PT End of Session - 05/30/23 1106     Visit Number 6    Number of Visits 7    Date for PT Re-Evaluation 06/07/23    Authorization Type [employer]    PT Start Time 1105    PT Stop Time 1140    PT Time Calculation (min) 35 min    Equipment Utilized During Treatment Gait belt    Activity Tolerance Patient tolerated treatment well    Behavior During Therapy WFL for tasks assessed/performed              Past Medical History:  Diagnosis Date   Allergy    Anemia    GERD (gastroesophageal reflux disease)    Hiatal hernia    Migraines    PONV (postoperative nausea and vomiting)    Sickle cell trait (HCC)    Ulcer    Past Surgical History:  Procedure Laterality Date   CESAREAN SECTION  1997, 1999   x2   CHOLECYSTECTOMY  1997   COLONOSCOPY     GASTRIC BYPASS  2013   HERNIA REPAIR     hiatal hernia   HYSTEROSCOPY  2006   KNEE SURGERY  2001   left   MYOMECTOMY  2010   SALPINGECTOMY Left    SMALL INTESTINE SURGERY     TOTAL HIP ARTHROPLASTY Right 11/28/2022   Procedure: TOTAL HIP ARTHROPLASTY ANTERIOR APPROACH;  Surgeon: Samson Frederic, MD;  Location: [facility];  Service: Orthopedics;  Laterality: Right;  150   UPPER GI ENDOSCOPY     "several"   Patient Active Problem List   Diagnosis Date Noted   Osteoarthritis of right hip 11/28/2022   Primary osteoarthritis of right  hip 11/28/2022   Athetosis 03/30/2020   Dystonia of foot 03/30/2020   Thiamine deficiency 09/15/2017   Routine general medical examination at a health care facility 09/10/2017   Iron deficiency anemia 06/17/2013   Status post gastric bypass for obesity 06/16/2013   Depressed 11/28/2011    PCP: Carmela Rima  REFERRING PROVIDER: Curt Bears, MD  REFERRING DIAG: M54.40 (ICD-10-CM) - Lumbago with sciatica, unspecified side  Rationale for Evaluation and Treatment: Rehabilitation  THERAPY DIAG:  Other abnormalities of gait and mobility  Pain in right hip  Unsteadiness on feet  Other low back pain  ONSET DATE: 03/05/2023 (referral)   SUBJECTIVE:  SUBJECTIVE STATEMENT: Patient reports that she has been having a steady ache in her hip; she followed up with pelvic PT and is still waiting on an update. Denies falls and near falls. Patient reports that after she did her exercises at home last time, she was super sore and painful. Patient reporting minimal improvement with PT exercises thus far.   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 Yes: NPRS scale: 3.5-4/10 Pain location: R hip Pain description: achy Aggravating factors: carrying heavy equipment at work, getting out bed in morning, carrying groceries up out of stairs Relieving factors: pain medication, ice/heating pack  PRECAUTIONS: Since hip surgery - avoid treadmill and going up steep hills   RED FLAGS: None   WEIGHT BEARING RESTRICTIONS: No  FALLS:  Has patient fallen in last 6 months? No  LIVING ENVIRONMENT: Lives with: lives with an adult  companion Lives in: House/apartment Stairs: Yes: External: 15-17 steps; can reach both Has following equipment at home:  walker, cane, booster seat for toilet  OCCUPATION: Biochemist, clinical  PLOF: Independent  PATIENT GOALS: "To get the pain where it is bearable, and I can function and opperate. I would like to be able to sleep comfortably."   NEXT MD VISIT: February with PCP, sees surgeon for hip next month   OBJECTIVE:  Note: Objective measures were completed at Evaluation unless otherwise noted.  DIAGNOSTIC FINDINGS:  IMPRESSION: 1. Moderate to advanced right hip joint degenerative changes for age. 2. No stress fracture or AVN. 3. Degenerated and torn anterior superior labrum. 4. Moderate right-sided peritendinitis without trochanteric bursitis. 5. Multiple uterine fibroids.  PATIENT SURVEYS:  Modified Oswestry 40% Impairment   SCREENING FOR RED FLAGS: Bowel or bladder incontinence: No Spinal tumors: No Cauda equina syndrome: No Compression fracture: No Abdominal aneurysm: No  COGNITION: Overall cognitive status: Within functional limits for tasks assessed     TODAY'S TREATMENT:                                                                                                                               Vitals:   05/30/23 1116 05/30/23 1118  BP: (!) 140/99 (!) 114/91  Pulse: 71 76   Seated on LUE, initially very near cutoff of safe therapeutic range, had patient perform eyes closed deep breathing x 10 breaths, upon recheck lowered to elevated but safe range, continue to recommend follow up with PCP for management  Ther Act (Goals check):  Assessed vitals (see above) and elevated but WNL this date STAIRS:  Level of Assistance: SBA  Stair Negotiation Technique: Alternating Pattern  with Single Rail on Right  Number of Stairs: 2 x 4 stairs   Height of Stairs: 6" step  Comments: appropriate pacing, mild antalgic compensation noted but grossly [facility]   [facility] PT  Assessment -  05/30/23 0001       Standardized Balance Assessment   Standardized Balance Assessment Five Times Sit to Stand    Five times sit to stand comments  18.70   seconds reporting no increase in pain            Modified Oswestry Disability Index: 46% Impairment   PATIENT EDUCATION:  Education details:  Continue HEP Person educated: Patient Education method: Explanation and Handouts Education comprehension: verbalized understanding  HOME EXERCISE PROGRAM: Access Code: [email or web address] URL: [email or web address] Date: 04/18/2023 Prepared by: Maryruth Eve  Exercises - Standing Hip Abduction with Resistance at Ankles and Counter Support  - 1 x daily - 7 x weekly - 3 sets - 10 reps - Standing Hip Extension with Resistance at Ankles and Counter Support  - 1 x daily - 7 x weekly - 3 sets - 10 reps - Side Stepping with Resistance at Ankles and Counter Support  - 1 x daily - 7 x weekly - 3 sets - 10 reps - Standing Clam with Resistance Loop  - 1 x daily - 7 x weekly - 3 sets - 10 reps - 6" step ups bilaterally with single UE support 3 x 5-10 pending fatigue - Supine SI Joint Self-Correction  - 1 x daily - 7 x weekly - 4 sets - 10 second hold  ASSESSMENT:  CLINICAL IMPRESSION: Patient D/C from PT due to minimal improvements with attempts at PT intervention. Patient will likely benefit from pelvic health consult to address underlying deficits in order to demonstrate further progress. Patient is agreeable to plan. Recommend continued follow up with PCP for BP.   OBJECTIVE IMPAIRMENTS: Abnormal gait, decreased balance, decreased mobility, difficulty walking, decreased ROM, decreased strength, and pain.   ACTIVITY LIMITATIONS: carrying, squatting, sleeping, stairs, and locomotion level  PARTICIPATION LIMITATIONS: meal prep, cleaning, community activity, occupation, and yard work  PERSONAL FACTORS: Time since onset of injury/illness/exacerbation and 1-2 comorbidities: see  above  are also affecting patient's functional outcome.   REHAB POTENTIAL: Good  CLINICAL DECISION MAKING: Stable/uncomplicated  EVALUATION COMPLEXITY: Low   GOALS: Goals reviewed with patient? Yes  SHORT TERM GOALS: Target date: 05/03/2023  Patient will demonstrate independence with initial HEP to continue to progress between physical therapy sessions.   Baseline: To be provided, reports confidence in HEP Goal status:MET   LONG TERM GOALS: Target date: 05/24/2023  Patient will report demonstrate independence with final HEP in order to maintain current gains and continue to progress after physical therapy discharge.   Baseline: To be provided, reports understanding of HEP Goal status: MET   2.  Patient will improve their 5x Sit to Stand score to less than 14 seconds to demonstrate a decreased risk for falls and improved LE strength.   Baseline: 17.34 seconds; 18.70 seconds without UE use Goal status: NOT met  3.  Patient will demonstrate ability to go up and down 2 x 4 stairs with use of single rail, step through pattern, and SBA in order for easier access into home. Baseline: 2 x 4 stairs step through with bilateral rails; 2 x 4 stairs step through with single rail Goal status: MET  4.  Patient will improve modified ODI score to 30% indicate a clinically important improvement in low back pain.   Baseline: 40%; 46% impairment  Goal status: NOT MET   PLAN:  PT FREQUENCY: 1x/week  PT DURATION: 6 weeks  PLANNED INTERVENTIONS: 97164- PT Re-evaluation, 97110-Therapeutic exercises, 97530- Therapeutic activity, [id number]- Neuromuscular re-education, 97535- Self Care, 40981-  Manual therapy, [id number]- Gait training, 40981- Aquatic Therapy, and Dry Needling.  PLAN FOR NEXT SESSION: NA - D/C from skilled PT services  Carmelia Bake, PT, DPT 05/30/2023, 11:59 AM

## 2023-06-06 ENCOUNTER — Ambulatory Visit: Payer: Self-pay | Admitting: Physical Therapy

## 2023-07-02 ENCOUNTER — Encounter: Payer: Self-pay | Admitting: Dermatology

## 2023-07-02 ENCOUNTER — Ambulatory Visit: Payer: 59 | Admitting: Dermatology

## 2023-07-02 DIAGNOSIS — L6612 Frontal fibrosing alopecia: Secondary | ICD-10-CM

## 2023-07-02 MED ORDER — SAFETY SEAL MISCELLANEOUS MISC: 4 refills | Status: DC

## 2023-07-02 NOTE — Patient Instructions (Signed)
 Dear Michelle Molina,  Thank you for visiting my office today. Your dedication to addressing your hair concerns is commendable, and I am here to support you throughout your treatment journey.  Here is a summary of the key instructions from today's consultation:  Diagnosis: Frontal Fibrosing Alopecia  - **Medications:**   - **Clobetasol and Minoxidil Combination:** Apply this to the affected areas every morning to control inflammation and stimulate hair regrowth. For application on the eyebrows, use a Q-tip to prevent unwanted facial hair growth.   - **Viviscal Supplements:** Support your hair health by taking two tablets daily.   - **Collagen:** To help strengthen your hair, consider incorporating collagen supplements into your diet.  - **Lifestyle Adjustments:**   - Continue with your usual hair washing routine.   - If you wear wigs, use a satin band underneath to reduce friction.  - **Pharmacy Coordination:**   [facility] will reach out to confirm your address and arrange payment for the five-month supply of your compounded medication.  - **Follow-Up:**   - Keep an eye on your progress, and expect to start seeing improvements in about four months.   - For any questions or to provide updates on your condition, please contact us  via MyChart.  It was a pleasure meeting you, and I am eager to assist you on your journey to improved hair health. Wishing you a wonderful day ahead!  Warm regards,  Dr. [facility]       Important Information  Due to recent changes in healthcare laws, you may see results of your pathology and/or laboratory studies on MyChart before the doctors have had a chance to review them. We understand that in some cases there may be results that are confusing or concerning to you. Please understand that not all results are received at the same time and often the doctors may need to interpret multiple results in order to provide you with the best plan  of care or course of treatment. Therefore, we ask that you please give us  2 business days to thoroughly review all your results before contacting the office for clarification. Should we see a critical lab result, you will be contacted sooner.   If You Need Anything After Your Visit  If you have any questions or concerns for your doctor, please call our main line at [phone] If no one answers, please leave a voicemail as directed and we will return your call as soon as possible. Messages left after 4 pm will be answered the following business day.   You may also send us  a message via MyChart. We typically respond to MyChart messages within 1-2 business days.  For prescription refills, please ask your pharmacy to contact our office. Our fax number is [phone].  If you have an urgent issue when the clinic is closed that cannot wait until the next business day, you can page your doctor at the number below.    Please note that while we do our best to be available for urgent issues outside of office hours, we are not available 24/7.   If you have an urgent issue and are unable to reach us , you may choose to seek medical care at your doctor's office, retail clinic, urgent care center, or emergency room.  If you have a medical emergency, please immediately call 911 or go to the emergency department. In the event of inclement weather, please call our main line at [phone] for an update on the status of any delays or  closures.  Dermatology Medication Tips: Please keep the boxes that topical medications come in in order to help keep track of the instructions about where and how to use these. Pharmacies typically print the medication instructions only on the boxes and not directly on the medication tubes.   If your medication is too expensive, please contact our office at [phone] or send us  a message through MyChart.   We are unable to tell what your co-pay for medications will be in  advance as this is different depending on your insurance coverage. However, we may be able to find a substitute medication at lower cost or fill out paperwork to get insurance to cover a needed medication.   If a prior authorization is required to get your medication covered by your insurance company, please allow us  1-2 business days to complete this process.  Drug prices often vary depending on where the prescription is filled and some pharmacies may offer cheaper prices.  The website www.goodrx.com contains coupons for medications through different pharmacies. The prices here do not account for what the cost may be with help from insurance (it may be cheaper with your insurance), but the website can give you the price if you did not use any insurance.  - You can print the associated coupon and take it with your prescription to the pharmacy.  - You may also stop by our office during regular business hours and pick up a GoodRx coupon card.  - If you need your prescription sent electronically to a different pharmacy, notify our office through [facility] or by phone at [phone]

## 2023-07-02 NOTE — Progress Notes (Signed)
   New Patient Visit   Subjective  Michelle Molina is a 58 y.o. female who presents for the following: hair loss. Scalp. Dur: ~3-4 years.  Worsened after starting new job. Had not used any chemicals in hair in over 10 years. Went to [place] and she dyed and permed her hair. Has had thinning and shedding since then. No prescription treatment for hair thinning. Gets flakes on scalp at times. Itches. No family Hx of hair loss or hair thinning.    The following portions of the chart were reviewed this encounter and updated as appropriate: medications, allergies, medical history  Review of Systems:  No other skin or systemic complaints except as noted in HPI or Assessment and Plan.  Objective  Well appearing patient in no apparent distress; mood and affect are within normal limits.  A focused examination was performed of the following areas: Scalp, face  Relevant exam findings are noted in the Assessment and Plan.           Assessment & Plan   Frontal Fibrosing Alopecia - Assessment: Patient with a 4-year history of frontal hairline recession and lateral eyebrow thinning. Clinical examination shows regression of the frontal hairline with erythema around hair follicles and thinning of lateral eyebrows. Associated symptoms include pruritus and scaling. Diagnosis based on clinical presentation, potentially triggered by chemical relaxer treatment 4 years ago, with possible hormonal influences.  - Plan:    - Prescribe compounded medication containing clobetasol (for inflammation control) and minoxidil (for hair regrowth stimulation) from [facility] pharmacy. Apply medication every morning to affected areas of scalp and eyebrows.   - Recommend Viviscal supplement, two tablets daily.   - Suggest collagen supplementation for hair thickness and strength.   - Advise use of satin band under wig cap if wearing wigs.   - Anticipate 4 months for initial results.   - Provide patient education on  medication application and hair care.   - Schedule follow-up to assess treatment response (timing not specified).  Long term medication management.  Patient is using long term (months to years) prescription medication  to control their dermatologic condition.  These medications require periodic monitoring to evaluate for efficacy and side effects and may require periodic laboratory monitoring.       Return in about 4 months (around 10/30/2023) for FFA follow up.  I, Jill Parcell, CMA, am acting as scribe for [employer], DO.   Documentation: I have reviewed the above documentation for accuracy and completeness, and I agree with the above.  Delon Lenis, DO

## 2023-07-08 ENCOUNTER — Other Ambulatory Visit: Payer: Self-pay

## 2023-07-08 DIAGNOSIS — D508 Other iron deficiency anemias: Secondary | ICD-10-CM

## 2023-07-09 ENCOUNTER — Inpatient Hospital Stay: Payer: 59 | Attending: Hematology and Oncology

## 2023-07-09 DIAGNOSIS — D508 Other iron deficiency anemias: Secondary | ICD-10-CM

## 2023-07-09 DIAGNOSIS — D509 Iron deficiency anemia, unspecified: Secondary | ICD-10-CM | POA: Insufficient documentation

## 2023-07-09 DIAGNOSIS — Z9884 Bariatric surgery status: Secondary | ICD-10-CM | POA: Diagnosis not present

## 2023-07-09 LAB — CBC WITH DIFFERENTIAL (CANCER CENTER ONLY)
Abs Immature Granulocytes: 0 10*3/uL (ref 0.00–0.07)
Basophils Absolute: 0 10*3/uL (ref 0.0–0.1)
Basophils Relative: 1 %
Eosinophils Absolute: 0.1 10*3/uL (ref 0.0–0.5)
Eosinophils Relative: 3 %
HCT: 41 % (ref 36.0–46.0)
Hemoglobin: 13.4 g/dL (ref 12.0–15.0)
Immature Granulocytes: 0 %
Lymphocytes Relative: 46 %
Lymphs Abs: 1.6 10*3/uL (ref 0.7–4.0)
MCH: 26.6 pg (ref 26.0–34.0)
MCHC: 32.7 g/dL (ref 30.0–36.0)
MCV: 81.5 fL (ref 80.0–100.0)
Monocytes Absolute: 0.3 10*3/uL (ref 0.1–1.0)
Monocytes Relative: 8 %
Neutro Abs: 1.4 10*3/uL — ABNORMAL LOW (ref 1.7–7.7)
Neutrophils Relative %: 42 %
Platelet Count: 193 10*3/uL (ref 150–400)
RBC: 5.03 MIL/uL (ref 3.87–5.11)
RDW: 15 % (ref 11.5–15.5)
WBC Count: 3.4 10*3/uL — ABNORMAL LOW (ref 4.0–10.5)
nRBC: 0 % (ref 0.0–0.2)

## 2023-07-09 LAB — IRON AND IRON BINDING CAPACITY (CC-WL,HP ONLY)
Iron: 78 ug/dL (ref 28–170)
Saturation Ratios: 23 % (ref 10.4–31.8)
TIBC: 339 ug/dL (ref 250–450)
UIBC: 261 ug/dL (ref 148–442)

## 2023-07-09 LAB — FERRITIN: Ferritin: 20 ng/mL (ref 11–307)

## 2023-07-11 ENCOUNTER — Inpatient Hospital Stay: Payer: 59 | Admitting: Hematology and Oncology

## 2023-07-11 DIAGNOSIS — D508 Other iron deficiency anemias: Secondary | ICD-10-CM | POA: Diagnosis not present

## 2023-07-11 NOTE — Assessment & Plan Note (Signed)
Gastric bypass surgery   IV iron: 2017, 2019, Feraheme 1 dose 01/01/2023 (couldn't tolerate: chemical Smell , nose dryness, tingling mouth, speech difficulty) Lab review:   12/22/2019: Hemoglobin 10.2, MCV 73, RDW 17.2, platelets 172 11/30/2022: Hemoglobin 10.4, MCV 80, RDW 16.4, platelets 142 01/29/2023: Hemoglobin 12.1, MCV 78.2, iron saturation 25%, ferritin 36 07/09/2023: Hemoglobin 13.4, MCV 81.5, iron saturation 23%, ferritin 20  Discussion: Patient has excellent hemoglobin and iron studies.  No role of additional IV iron therapy. Recheck labs in 6 months and telephone visit after that to discuss results.

## 2023-07-11 NOTE — Progress Notes (Signed)
HEMATOLOGY-ONCOLOGY TELEPHONE VISIT PROGRESS NOTE  I connected with our patient on 07/11/23 at  8:00 AM EST by telephone and verified that I am speaking with the correct person using two identifiers.  I discussed the limitations, risks, security and privacy concerns of performing an evaluation and management service by telephone and the availability of in person appointments.  I also discussed with the patient that there may be a patient responsible charge related to this service. The patient expressed understanding and agreed to proceed.   History of Present Illness: Follow-up of iron deficiency anemia  History of Present Illness   The patient, with a history of iron deficiency, reports feeling 'okay.' She has been self-supplementing with a liquid iron supplement purchased online. She reports no adverse effects from the supplement. She has a history of a severe reaction to prescribed iron supplementation, which she describes as 'crazy' and 'never happened before.' She expresses relief at not needing further iron supplementation.          REVIEW OF SYSTEMS:   Constitutional: Denies fevers, chills or abnormal weight loss All other systems were reviewed with the patient and are negative. Observations/Objective:     Assessment Plan:  Iron deficiency anemia Gastric bypass surgery   IV iron: 2017, 2019, Feraheme 1 dose 01/01/2023 (couldn't tolerate: chemical Smell , nose dryness, tingling mouth, speech difficulty) Lab review:   12/22/2019: Hemoglobin 10.2, MCV 73, RDW 17.2, platelets 172 11/30/2022: Hemoglobin 10.4, MCV 80, RDW 16.4, platelets 142 01/29/2023: Hemoglobin 12.1, MCV 78.2, iron saturation 25%, ferritin 36 07/09/2023: Hemoglobin 13.4, MCV 81.5, iron saturation 23%, ferritin 20  Discussion: Patient has excellent hemoglobin and iron studies.  No role of additional IV iron therapy. Recheck labs in 6 months and telephone visit after that to discuss  results. --------------------------------- Assessment and Plan    Iron Deficiency Anemia Hemoglobin improved to 13.4, ferritin and iron saturation levels are above target. Patient has been taking liquid iron supplement. No current need for additional iron supplementation. -Continue current iron supplementation regimen. -Extend lab monitoring frequency to every six months.          I discussed the assessment and treatment plan with the patient. The patient was provided an opportunity to ask questions and all were answered. The patient agreed with the plan and demonstrated an understanding of the instructions. The patient was advised to call back or seek an in-person evaluation if the symptoms worsen or if the condition fails to improve as anticipated.   I provided 20 minutes of non-face-to-face time during this encounter.  This includes time for charting and coordination of care   Tamsen Meek, MD

## 2023-08-17 ENCOUNTER — Encounter (INDEPENDENT_AMBULATORY_CARE_PROVIDER_SITE_OTHER): Payer: Self-pay

## 2023-09-20 ENCOUNTER — Emergency Department (HOSPITAL_COMMUNITY)

## 2023-09-20 ENCOUNTER — Emergency Department (HOSPITAL_COMMUNITY)
Admission: EM | Admit: 2023-09-20 | Discharge: 2023-09-20 | Disposition: A | Discharge status: Home / Self Care | Attending: Emergency Medicine | Admitting: Emergency Medicine

## 2023-09-20 ENCOUNTER — Encounter (HOSPITAL_COMMUNITY): Payer: Self-pay | Admitting: Emergency Medicine

## 2023-09-20 DIAGNOSIS — R42 Dizziness and giddiness: Secondary | ICD-10-CM | POA: Diagnosis not present

## 2023-09-20 DIAGNOSIS — M25551 Pain in right hip: Secondary | ICD-10-CM | POA: Insufficient documentation

## 2023-09-20 DIAGNOSIS — R519 Headache, unspecified: Secondary | ICD-10-CM | POA: Diagnosis not present

## 2023-09-20 DIAGNOSIS — R55 Syncope and collapse: Secondary | ICD-10-CM | POA: Diagnosis present

## 2023-09-20 DIAGNOSIS — R569 Unspecified convulsions: Secondary | ICD-10-CM

## 2023-09-20 LAB — COMPREHENSIVE METABOLIC PANEL WITH GFR
ALT: 42 U/L (ref 0–44)
AST: 88 U/L — ABNORMAL HIGH (ref 15–41)
Albumin: 3.4 g/dL — ABNORMAL LOW (ref 3.5–5.0)
Alkaline Phosphatase: 105 U/L (ref 38–126)
Anion gap: 11 (ref 5–15)
BUN: 15 mg/dL (ref 6–20)
CO2: 20 mmol/L — ABNORMAL LOW (ref 22–32)
Calcium: 8.9 mg/dL (ref 8.9–10.3)
Chloride: 107 mmol/L (ref 98–111)
Creatinine, Ser: 0.88 mg/dL (ref 0.44–1.00)
GFR, Estimated: >60 mL/min (ref >60)
Glucose, Bld: 97 mg/dL (ref 70–99)
Potassium: 4.8 mmol/L (ref 3.5–5.1)
Sodium: 138 mmol/L (ref 135–145)
Total Bilirubin: 1.4 mg/dL — ABNORMAL HIGH (ref 0.0–1.2)
Total Protein: 5.5 g/dL — ABNORMAL LOW (ref 6.5–8.1)

## 2023-09-20 LAB — CBC WITH DIFFERENTIAL/PLATELET
Abs Immature Granulocytes: 0.01 10*3/uL (ref 0.00–0.07)
Basophils Absolute: 0 10*3/uL (ref 0.0–0.1)
Basophils Relative: 1 %
Eosinophils Absolute: 0.1 10*3/uL (ref 0.0–0.5)
Eosinophils Relative: 3 %
HCT: 39.5 % (ref 36.0–46.0)
Hemoglobin: 13 g/dL (ref 12.0–15.0)
Immature Granulocytes: 0 %
Lymphocytes Relative: 49 %
Lymphs Abs: 1.7 10*3/uL (ref 0.7–4.0)
MCH: 27.4 pg (ref 26.0–34.0)
MCHC: 32.9 g/dL (ref 30.0–36.0)
MCV: 83.3 fL (ref 80.0–100.0)
Monocytes Absolute: 0.3 10*3/uL (ref 0.1–1.0)
Monocytes Relative: 9 %
Neutro Abs: 1.3 10*3/uL — ABNORMAL LOW (ref 1.7–7.7)
Neutrophils Relative %: 38 %
Platelets: 180 10*3/uL (ref 150–400)
RBC: 4.74 MIL/uL (ref 3.87–5.11)
RDW: 14.3 % (ref 11.5–15.5)
WBC: 3.4 10*3/uL — ABNORMAL LOW (ref 4.0–10.5)
nRBC: 0 % (ref 0.0–0.2)

## 2023-09-20 LAB — I-STAT CHEM 8, ED
BUN: 20 mg/dL (ref 6–20)
Calcium, Ion: 0.98 mmol/L — ABNORMAL LOW (ref 1.15–1.40)
Chloride: 106 mmol/L (ref 98–111)
Creatinine, Ser: 1 mg/dL (ref 0.44–1.00)
Glucose, Bld: 108 mg/dL — ABNORMAL HIGH (ref 70–99)
HCT: 37 % (ref 36.0–46.0)
Hemoglobin: 12.6 g/dL (ref 12.0–15.0)
Potassium: 6.4 mmol/L (ref 3.5–5.1)
Sodium: 136 mmol/L (ref 135–145)
TCO2: 26 mmol/L (ref 22–32)

## 2023-09-20 LAB — CBG MONITORING, ED: Glucose-Capillary: 111 mg/dL — ABNORMAL HIGH (ref 70–99)

## 2023-09-20 LAB — ETHANOL: Alcohol, Ethyl (B): <10 mg/dL (ref <10)

## 2023-09-20 LAB — PROTIME-INR
INR: 1.1 (ref 0.8–1.2)
Prothrombin Time: 14.7 s (ref 11.4–15.2)

## 2023-09-20 LAB — TROPONIN I (HIGH SENSITIVITY)
Troponin I (High Sensitivity): 7 ng/L (ref <18)
Troponin I (High Sensitivity): 8 ng/L (ref <18)

## 2023-09-20 MED ORDER — PROCHLORPERAZINE EDISYLATE 10 MG/2ML IJ SOLN
10.0000 mg | Freq: Once | INTRAMUSCULAR | Status: AC
  Administered 2023-09-20: 10 mg via INTRAVENOUS
  Filled 2023-09-20: qty 2

## 2023-09-20 MED ORDER — SODIUM CHLORIDE 0.9% FLUSH
3.0000 mL | Freq: Once | INTRAVENOUS | Status: AC
  Administered 2023-09-20: 3 mL via INTRAVENOUS

## 2023-09-20 NOTE — ED Notes (Signed)
 EEG at bedside.

## 2023-09-20 NOTE — Discharge Instructions (Addendum)
 Please follow-up with your primary doctor.  Please return immediately develop severe headache, vision changes, facial droop, unilateral weakness, difficulty finding words, you pass out again, develop chest pain, shortness of breath or any new or worsening symptoms that are concerning to you.  We are also referring you to neurology for your headaches.  They should call to schedule an appointment.

## 2023-09-20 NOTE — Procedures (Signed)
 Patient Name: Michelle Molina  MRN: [id number]  Epilepsy Attending: Charlsie Quest  Referring Physician/Provider: Erick Blinks, MD  Date: 09/20/2023 Duration: 33.02 mins  Patient history:  59 y.o. female with hx of chronic left foot dystonia, PONV, sickel cell trait, migraines, tension headache, hiatal hernia, who presents after a syncopal episode with prodrome of feeling drunk and off balance.  EEG to evaluate for seizure  Level of alertness: Awake, asleep  AEDs during EEG study: None  Technical aspects: This EEG study was done with scalp electrodes positioned according to the 10-20 International system of electrode placement. Electrical activity was reviewed with band pass filter of 1-70Hz , sensitivity of 7 uV/mm, display speed of 56mm/sec with a 60Hz  notched filter applied as appropriate. EEG data were recorded continuously and digitally stored.  Video monitoring was available and reviewed as appropriate.  Description: The posterior dominant rhythm consists of 8-9 Hz activity of moderate voltage (25-35 uV) seen predominantly in posterior head regions, symmetric and reactive to eye opening and eye closing. Sleep was characterized by vertex waves, sleep spindles (12 to 14 Hz), maximal frontocentral region.  Hyperventilation did not show any EEG change.  Physiologic photic driving was seen during photic stimulation.   IMPRESSION: This study is within normal limits. No seizures or epileptiform discharges were seen throughout the recording.  A normal interictal EEG does not exclude the diagnosis of epilepsy.  Michelle Molina

## 2023-09-20 NOTE — Code Documentation (Signed)
 Responded to Code Stroke called at 0246 for L sided facial droop and dizziness, LSN-0200. Pt arrived at 0254 with resolution of facial droop, NIH-0, CBG-111, CT head negative for acute changes. TNK not given>NIH-0. Plan: syncopy w/o. Please complete VS/neuro checks q2h x 12h, then q4h.

## 2023-09-20 NOTE — ED Provider Notes (Signed)
 Received signout; pending EEG.  See overnight team's for full HPI.  Reports continues to have headache, but slightly improved.  EEG normal.  MRI CT head normal.  Labs reviewed, no significant metabolic derangements.  No infectious markers.  Unlikely cardiac etiology of negative troponin and reassuring EKG.  Appears to be sinus rhythm with a rate in the mid 60s on the monitor per my independent interpretation.  Suspect micturition type syncope.  Stroke workup negative.  Hip x-ray negative.  Discussed follow-up outpatient with neurology and primary doctor.  Patient is agreeable plan.  Strict return precautions given.   Coral Spikes, DO 09/20/23 1025

## 2023-09-20 NOTE — Progress Notes (Signed)
 EEG complete - results pending

## 2023-09-20 NOTE — Consult Note (Signed)
 NEUROLOGY CONSULT NOTE   Date of service: September 20, 2023 Patient Name: Michelle Molina MRN:  [id number] DOB:  April 20, 1966 Chief Complaint: "stroke code for concern for a left facial droop and dizziness" Requesting Provider: No att. providers found  History of Present Illness  Michelle Molina is a 58 y.o. female with hx of chronic left foot dystonia, PONV, sickel cell trait, migraines, tension headache, hiatal hernia, who works in [employer], commissioning. She was walking to the bathroom but had forgotten her badge/keys and was walking back when she felt dizzy, like she was drunk. She leaned against the door and then passed out. She woke up to her supervisor and colleagues around her.  EMS called and they were concerned about a left facial droop and she was brought in as a code stroke.  She reports 2 weeks hx of headaches. Has a hx of migraine and tension headaches. Reports been recently prescribed flexeril but has not started taking it yet.  She did not have any post ictal period. She is back to her baseline.  LKW: 0200 Modified rankin score: 0-Completely asymptomatic and back to baseline post- stroke IV Thrombolysis: not offered, no deficits, low suspicion for stroke EVT: not offered, no deficits, low suspicion for stroke  NIHSS components Score: Comment  1a Level of Conscious 0[x]  1[]  2[]  3[]      1b LOC Questions 0[x]  1[]  2[]       1c LOC Commands 0[x]  1[]  2[]       2 Best Gaze 0[x]  1[]  2[]       3 Visual 0[x]  1[]  2[]  3[]      4 Facial Palsy 0[x]  1[]  2[]  3[]      5a Motor Arm - left 0[x]  1[]  2[]  3[]  4[]  UN[]    5b Motor Arm - Right 0[x]  1[]  2[]  3[]  4[]  UN[]    6a Motor Leg - Left 0[x]  1[]  2[]  3[]  4[]  UN[]    6b Motor Leg - Right 0[x]  1[]  2[]  3[]  4[]  UN[]    7 Limb Ataxia 0[x]  1[]  2[]  3[]  UN[]     8 Sensory 0[x]  1[]  2[]  UN[]      9 Best Language 0[x]  1[]  2[]  3[]      10 Dysarthria 0[x]  1[]  2[]  UN[]      11 Extinct. and Inattention 0[x]  1[]  2[]       TOTAL: 0      ROS  Comprehensive ROS performed and  pertinent positives documented in HPI   Past History   Past Medical History:  Diagnosis Date   Allergy    Anemia    GERD (gastroesophageal reflux disease)    Hiatal hernia    Migraines    PONV (postoperative nausea and vomiting)    Sickle cell trait (HCC)    Ulcer     Past Surgical History:  Procedure Laterality Date   CESAREAN SECTION  1997, 1999   x2   CHOLECYSTECTOMY  1997   COLONOSCOPY     GASTRIC BYPASS  2013   HERNIA REPAIR     hiatal hernia   HYSTEROSCOPY  2006   KNEE SURGERY  2001   left   MYOMECTOMY  2010   SALPINGECTOMY Left    SMALL INTESTINE SURGERY     TOTAL HIP ARTHROPLASTY Right 11/28/2022   Procedure: TOTAL HIP ARTHROPLASTY ANTERIOR APPROACH;  Surgeon: Samson Frederic, MD;  Location: [facility];  Service: Orthopedics;  Laterality: Right;  150   UPPER GI ENDOSCOPY     "several"    Family History: Family History  Problem Relation Age of Onset  Uterine cancer Mother    Hypertension Mother    Hyperlipidemia Mother    Other Mother        sarcoma   Breast cancer Maternal Aunt        x3 maternal aunts   Lung cancer Maternal Uncle    Diabetes Maternal Grandfather    Hyperlipidemia Maternal Grandfather    Stroke Maternal Grandfather    Breast cancer Cousin     Social History  reports that she has never smoked. She has never used smokeless tobacco. She reports that she does not currently use alcohol. She reports that she does not use drugs.  Allergies  Allergen Reactions   Feraheme [Ferumoxytol] Other (See Comments)    Numbness/tingling in mouth along, difficulty speaking without difficulty in breathing, and nausea. See progress note from 01/01/23   Ibuprofen Other (See Comments)    Had gastric bypass Due to gastric bypass surgery  ulcer    Aspirin Nausea Only and Other (See Comments)   Percocet [Oxycodone-Acetaminophen] Itching    Per patient she can take regular tylenol.   Tramadol Hcl Nausea Only    Dizziness    Medications   Current  Facility-Administered Medications:    sodium chloride flush (NS) 0.9 % injection 3 mL, 3 mL, Intravenous, Once, Cardama, Amadeo Garnet, MD  Current Outpatient Medications:    DULoxetine (CYMBALTA) 30 MG capsule, Take 30 mg by mouth in the morning and at bedtime., Disp: , Rfl:    HYDROcodone-acetaminophen (NORCO) 10-325 MG tablet, TAKE 1/2 TABLET BY MOUTH EVERY 4 HOURS FOR 7 DAYS AS NEEDED FOR PAIN (Patient not taking: Reported on 03/18/2023), Disp: , Rfl:    metoprolol succinate (TOPROL-XL) 25 MG 24 hr tablet, Take 25 mg by mouth daily., Disp: , Rfl:    Safety Seal Miscellaneous MISC, Apply qam to affected areas on scalp and eyebrows, Disp: 30 g, Rfl: 4  Vitals  There were no vitals filed for this visit.  There is no height or weight on file to calculate BMI.  Physical Exam   General: Laying comfortably in bed; in no acute distress.  HENT: Normal oropharynx and mucosa. Normal external appearance of ears and nose.  Neck: Supple, no pain or tenderness  CV: No JVD. No peripheral edema.  Pulmonary: Symmetric Chest rise. Normal respiratory effort.  Abdomen: Soft to touch, non-tender.  Ext: No cyanosis, edema, or deformity  Skin: No rash. Normal palpation of skin.   Musculoskeletal: Normal digits and nails by inspection. No clubbing.   Neurologic Examination  Mental status/Cognition: Alert, oriented to self, place, month and year, good attention.  Speech/language: Fluent, comprehension intact, object naming intact, repetition intact.  Cranial nerves:   CN II Pupils equal and reactive to light, no VF deficits    CN III,IV,VI EOM intact, no gaze preference or deviation, no nystagmus    CN V normal sensation in V1, V2, and V3 segments bilaterally    CN VII no asymmetry, no nasolabial fold flattening    CN VIII normal hearing to speech    CN IX & X normal palatal elevation, no uvular deviation    CN XI 5/5 head turn and 5/5 shoulder shrug bilaterally    CN XII midline tongue protrusion     Motor:  Muscle bulk: normal, tone normal, pronator drift none tremor none Mvmt Root Nerve  Muscle Right Left Comments  SA C5/6 Ax Deltoid 5 5   EF C5/6 Mc Biceps 5 5   EE C6/7/8 Rad Triceps 5 5  WF C6/7 Med FCR     WE C7/8 PIN ECU     F Ab C8/T1 U ADM/FDI 5 5   HF L1/2/3 Fem Illopsoas 5 4 Deferred L hip flexion due to hip replacement and pain  KE L2/3/4 Fem Quad 5 5   DF L4/5 D Peron Tib Ant 5 5   PF S1/2 Tibial Grc/Sol 5 5    Sensation:  Light touch Intact throughout   Pin prick    Temperature    Vibration   Proprioception    Coordination/Complex Motor:  - Finger to Nose intact BL - Heel to shin intact BL - Rapid alternating movement are normal - Gait: deferred.  Labs/Imaging/Neurodiagnostic studies   CBC: No results for input(s): "WBC", "NEUTROABS", "HGB", "HCT", "MCV", "PLT" in the last 168 hours. Basic Metabolic Panel:  Lab Results  Component Value Date   NA 138 11/29/2022   K 4.3 11/29/2022   CO2 21 (L) 11/29/2022   GLUCOSE 153 (H) 11/29/2022   BUN 16 11/29/2022   CREATININE 0.82 11/29/2022   CALCIUM 8.3 (L) 11/29/2022   GFRNONAA >60 11/29/2022   GFRAA 89 03/30/2020   Lipid Panel:  Lab Results  Component Value Date   LDLCALC 81 09/10/2017   HgbA1c:  Lab Results  Component Value Date   HGBA1C 6.2 (H) 11/16/2022   Urine Drug Screen: No results found for: "LABOPIA", "COCAINSCRNUR", "LABBENZ", "AMPHETMU", "THCU", "LABBARB"  Alcohol Level No results found for: "ETH" INR No results found for: "INR" APTT No results found for: "APTT" AED levels: No results found for: "PHENYTOIN", "ZONISAMIDE", "LAMOTRIGINE", "LEVETIRACETA"  CT Head without contrast(Personally reviewed): CTH was negative for a large hypodensity concerning for a large territory infarct or hyperdensity concerning for an ICH   MRI Brain: pending  Neurodiagnostics rEEG:  pending  ASSESSMENT   Aniaya D Idalia Needle is a 58 y.o. female with hx of chronic left foot dystonia, PONV, sickel cell  trait, migraines, tension headache, hiatal hernia, who presents after a syncopal episode with prodrome of feeling drunk and off balance. Brought in initially as a code stroke for pt being off balance and EMS concerned about a left facial droop but no deficit on my evaluation with symmetric facial movements and no ataxia. No post ictal period.  Reports 2 week hx of headache and nausea. Etiology unclear. Low suspicion for a first time seizure at her age.  RECOMMENDATIONS  - will get MRI Brain and a routine EEG. If these are normal, no further workup. - neurology will follow up on MRI brain and rEEG but otherwise do not plan to actively follow along. Please feel free to contact us with any questions or concerns. ______________________________________________________________________    Welton Flakes, MD [facility]

## 2023-09-20 NOTE — ED Triage Notes (Addendum)
 Pt brought in by EMS, pt states she was standing talking to her coworker then woke up on the floor. Pt states she was feeling dizzy and nauseous prior to the syncopal episode. Pt states she felt "almost drunk" prior to the syncopal episode  LKW 0200  18G L AC 110/62 136 90 HR 58-60 99%  room air RR 14-16

## 2023-09-20 NOTE — ED Provider Notes (Signed)
 [place] EMERGENCY DEPARTMENT AT [facility] Provider Note  CSN: [id number] Arrival date & time: 09/20/23 0254  Chief Complaint(s) Loss of Consciousness and Dizziness  HPI Michelle Molina is a 58 y.o. female with a past medical history listed below who presents to the emergency department as a code stroke brought in by EMS.  Patient comes from work at [employer] facility.  While at work, patient had an episode of loss of consciousness.  During evaluation EMS noted left facial droop.  Patient had been complaining of headache and dizziness.  No other focal deficits noted.  Patient reports that she has been having a headache for the past several days.  Reported that just prior to the syncopal episode, she felt "almost drunk."  She also stated that it felt like she was off balance and felt like she was on a roller coaster.  She does not remember falling.  Stated that she remembers being on her feet and waking up on the ground after her supervisors were checking on her.  The episode was reportedly unwitnessed, but she was speaking with somebody on the intercom. Last known well was around 2 AM.  Patient denied any associated chest pain or shortness of breath.  No visual disturbance.  No recent nausea or vomiting.  No diarrhea.  No bloody bowel movements.  Patient no longer has menstrual cycle.  No recent fevers or infections.  Patient is complaining of right hip pain stating that she believes she might of landed on it.  Denies any other injuries related to the fall.  Reports prior total right hip replacement.  The history is provided by the patient.    Past Medical History Past Medical History:  Diagnosis Date   Allergy    Anemia    GERD (gastroesophageal reflux disease)    Hiatal hernia    Migraines    PONV (postoperative nausea and vomiting)    Sickle cell trait (HCC)    Ulcer    Patient Active Problem List   Diagnosis Date Noted   Osteoarthritis of right hip 11/28/2022    Primary osteoarthritis of right hip 11/28/2022   Athetosis 03/30/2020   Dystonia of foot 03/30/2020   Thiamine deficiency 09/15/2017   Routine general medical examination at a health care facility 09/10/2017   Iron deficiency anemia 06/17/2013   Status post gastric bypass for obesity 06/16/2013   Depressed 11/28/2011   Home Medication(s) Prior to Admission medications   Medication Sig Start Date End Date Taking? Authorizing Provider  DULoxetine (CYMBALTA) 30 MG capsule Take 30 mg by mouth in the morning and at bedtime.    [provider]  HYDROcodone-acetaminophen (NORCO) 10-325 MG tablet TAKE 1/2 TABLET BY MOUTH EVERY 4 HOURS FOR 7 DAYS AS NEEDED FOR PAIN Patient not taking: Reported on 03/18/2023 12/11/22   [provider]  metoprolol succinate (TOPROL-XL) 25 MG 24 hr tablet Take 25 mg by mouth daily.    [provider]  Safety Seal Miscellaneous MISC Apply qam to affected areas on scalp and eyebrows 07/02/23   Terri Piedra, DO  Allergies Feraheme [ferumoxytol], Ibuprofen, Aspirin, Percocet [oxycodone-acetaminophen], and Tramadol hcl  Review of Systems Review of Systems As noted in HPI  Physical Exam Vital Signs  I have reviewed the triage vital signs BP 110/74   Pulse (!) 58   Temp 97.6 F (36.4 C) (Oral)   Resp 16   Ht 5' 4.75" (1.645 m)   Wt 87.5 kg   LMP 09/23/2018   SpO2 100%   BMI 32.37 kg/m   Physical Exam Vitals reviewed.  Constitutional:      General: She is not in acute distress.    Appearance: She is well-developed. She is not diaphoretic.  HENT:     Head: Normocephalic and atraumatic.     Nose: Nose normal.  Eyes:     General: No scleral icterus.       Right eye: No discharge.        Left eye: No discharge.     Conjunctiva/sclera: Conjunctivae normal.     Pupils: Pupils are equal, round, and  reactive to light.  Cardiovascular:     Rate and Rhythm: Normal rate and regular rhythm.     Heart sounds: No murmur heard.    No friction rub. No gallop.  Pulmonary:     Effort: Pulmonary effort is normal. No respiratory distress.     Breath sounds: Normal breath sounds. No stridor. No rales.  Abdominal:     General: There is no distension.     Palpations: Abdomen is soft.     Tenderness: There is no abdominal tenderness.  Musculoskeletal:        General: No tenderness.     Cervical back: Normal range of motion and neck supple.  Skin:    General: Skin is warm and dry.     Findings: No erythema or rash.  Neurological:     Mental Status: She is alert and oriented to person, place, and time.     ED Results and Treatments Labs (all labs ordered are listed, but only abnormal results are displayed) Labs Reviewed  CBC WITH DIFFERENTIAL/PLATELET - Abnormal; Notable for the following components:      Result Value   WBC 3.4 (*)    Neutro Abs 1.3 (*)    All other components within normal limits  COMPREHENSIVE METABOLIC PANEL WITH GFR - Abnormal; Notable for the following components:   CO2 20 (*)    Total Protein 5.5 (*)    Albumin 3.4 (*)    AST 88 (*)    Total Bilirubin 1.4 (*)    All other components within normal limits  I-STAT CHEM 8, ED - Abnormal; Notable for the following components:   Potassium 6.4 (*)    Glucose, Bld 108 (*)    Calcium, Ion 0.98 (*)    All other components within normal limits  CBG MONITORING, ED - Abnormal; Notable for the following components:   Glucose-Capillary 111 (*)    All other components within normal limits  PROTIME-INR  ETHANOL  TROPONIN I (HIGH SENSITIVITY)  TROPONIN I (HIGH SENSITIVITY)  EKG  EKG Interpretation Date/Time:  Friday September 20 2023 03:13:24 EDT Ventricular Rate:  60 PR Interval:  139 QRS  Duration:  103 QT Interval:  442 QTC Calculation: 442 R Axis:   83  Text Interpretation: Sinus rhythm No significant change was found Confirmed by Drema Pry [phone]) on 09/20/2023 3:49:44 AM       Radiology MR BRAIN WO CONTRAST Result Date: 09/20/2023 CLINICAL DATA:  Neuro deficit with acute stroke suspected EXAM: MRI HEAD WITHOUT CONTRAST TECHNIQUE: Multiplanar, multiecho pulse sequences of the brain and surrounding structures were obtained without intravenous contrast. COMPARISON:  04/06/2020 FINDINGS: Brain: No acute infarction, hemorrhage, hydrocephalus, extra-axial collection or mass lesion. Partially empty sella, also seen on prior. Three or 4 remote white matter insults, usually considered allowable for patient age. No atrophy or generalized white matter disease. Vascular: Normal flow voids. Skull and upper cervical spine: Normal marrow signal. Sinuses/Orbits: Negative. IMPRESSION: No acute finding including infarct. Few remote white matter insults with nonspecific pattern, commonly seen to this degree in healthy patients of this age. Electronically Signed   By: Tiburcio Pea M.D.   On: 09/20/2023 06:04   CT HEAD CODE STROKE WO CONTRAST Result Date: 09/20/2023 CLINICAL DATA:  Code stroke.  Neuro deficit, acute, stroke suspected EXAM: CT HEAD WITHOUT CONTRAST TECHNIQUE: Contiguous axial images were obtained from the base of the skull through the vertex without intravenous contrast. RADIATION DOSE REDUCTION: This exam was performed according to the departmental dose-optimization program which includes automated exposure control, adjustment of the mA and/or kV according to patient size and/or use of iterative reconstruction technique. COMPARISON:  CT head April 11, 2006. FINDINGS: Brain: No evidence of acute large vascular territory infarction, hemorrhage, hydrocephalus, extra-axial collection or mass lesion/mass effect. Partially empty sella. Vascular: No hyperdense vessel identified.  Skull: No acute fracture. Sinuses/Orbits: Clear sinuses.  No acute orbital findings. Other: No mastoid effusions. ASPECTS [facility] Stroke Program Early CT Score) Total score (0-10 with 10 being normal): 10. IMPRESSION: 1. No evidence of acute intracranial abnormality.  ASPECTS is 10. 2. Partially empty sella, which is often a normal anatomic variant but can be associated with idiopathic intracranial hypertension. Code stroke imaging results were communicated on 09/20/2023 at 3:09 am to provider Dr. Derry Lory via secure text paging. Electronically Signed   By: Feliberto Harts M.D.   On: 09/20/2023 03:10    Medications Ordered in ED Medications  sodium chloride flush (NS) 0.9 % injection 3 mL (3 mLs Intravenous Given 09/20/23 0337)  prochlorperazine (COMPAZINE) injection 10 mg (10 mg Intravenous Given 09/20/23 0328)   Procedures Procedures  (including critical care time) Medical Decision Making / ED Course   Medical Decision Making Amount and/or Complexity of Data Reviewed Labs: ordered. Decision-making details documented in ED Course. Radiology: ordered and independent interpretation performed. Decision-making details documented in ED Course. ECG/medicine tests: ordered and independent interpretation performed. Decision-making details documented in ED Course.  Risk Prescription drug management.     Differential diagnosis considered.  Workup below.   Loss of consciousness Brought in as a possible code stroke.  Patient is not a good candidate for  TNK.  Neurology evaluated, they recommended obtaining an MRI to rule out acute stroke.  This was negative.  They also recommended routine EEG which will be done later on this morning.  No further neurologic workup recommended. EKG did not show evidence of acute ischemic changes, dysrhythmias or blocks.  Serial troponins were negative x 2.  ACS is unlikely.  I have low suspicion  for pulmonary embolism. CBC without leukocytosis or anemia.   Metabolic panel without significant electrolyte derangements or renal sufficiency.   Headache H/o migraines Improved with headache cocktail  Hip pain Xray negative. Likely contusion.         Final Clinical Impression(s) / ED Diagnoses Final diagnoses:  None    This chart was dictated using voice recognition software.  Despite best efforts to proofread,  errors can occur which can change the documentation meaning.    Nira Conn, MD 09/21/23 [phone]

## 2023-09-20 NOTE — ED Notes (Signed)
 Pt returned from MRI at this time

## 2023-09-20 NOTE — ED Notes (Signed)
 Pt transported to MRI at this time

## 2023-11-05 ENCOUNTER — Ambulatory Visit: Payer: 59 | Admitting: Dermatology

## 2023-11-05 ENCOUNTER — Encounter: Payer: Self-pay | Admitting: Dermatology

## 2023-11-05 VITALS — BP 137/85 | HR 67

## 2023-11-05 DIAGNOSIS — L6612 Frontal fibrosing alopecia: Secondary | ICD-10-CM

## 2023-11-05 MED ORDER — DOXYCYCLINE HYCLATE 100 MG PO TABS
100.0000 mg | ORAL_TABLET | Freq: Every day | ORAL | 0 refills | Status: DC
Start: 2023-11-05 — End: 2024-03-09

## 2023-11-05 NOTE — Progress Notes (Signed)
   Follow-Up Visit   Subjective  Michelle Molina is a 58 y.o. female who presents for the following: Frontal Fibrosing Alopecia   Michelle Molina, a patient with a history of frontal fibrosing alopecia, presents for follow-up of hair loss treatment. The patient reports progress in hair growth, noting "some fuzz" in previously affected areas.  Michelle Molina has been adhering to the prescribed treatment regimen, which includes applying minoxidil and clobetasol topically every morning upon waking. Additionally, the patient has been taking Vivoscal and vital proteins as part of the treatment plan. The patient expresses satisfaction with the current progress, stating, "I'm thrilled."   To minimize potential trauma to the hair, Michelle Molina has been using silk bands when wearing hairpieces. The patient reports no issues or concerns with the current treatment plan and appears to be tolerating it well. Overall, Michelle Molina seems to be experiencing a positive response to the current treatment approach, with visible improvements in hair growth over the past four months.   The following portions of the chart were reviewed this encounter and updated as appropriate: medications, allergies, medical history  Review of Systems:  No other skin or systemic complaints except as noted in HPI or Assessment and Plan.  Objective  Well appearing patient in no apparent distress; mood and affect are within normal limits.   A focused examination was performed of the following areas: Scalp  Relevant exam findings are noted in the Assessment and Plan.          Assessment & Plan   Frontal Fibrosing Alopecia - Assessment: Patient shows some improvement with current treatment regimen, but requires additional management for better control of inflammation. Visible improvement with some hair regrowth noted. The condition is being managed as an inflammatory process.  - Plan:    Continue current regimen:     - Topical minoxidil with clobetasol  applied daily in the morning     - Oral supplements: Vivoscal and vital proteins    Add oral doxycycline 100 mg PO daily with dinner     - Informed patient about potential side effect of upset stomach and advised taking with food     - Recommended adding a probiotic in the morning, taken a couple of hours apart from doxycycline    Discussed alternative option of Plaquenil (hydroxychloroquine) if doxycycline is not tolerated or ineffective    Advised continued use of silk bands if wearing hairpieces to minimize friction and trauma    Follow-up in 4 months to assess progress    If insufficient improvement noted at follow-up, consider corticosteroid injections  Follow-up in 4 months for reassessment and evaluation of treatment efficacy.    Return in about 4 months (around 03/07/2024).  Exie Holler, CMA, am acting as scribe for [employer], DO.   Documentation: I have reviewed the above documentation for accuracy and completeness, and I agree with the above.  Louana Roup, DO

## 2023-11-05 NOTE — Patient Instructions (Addendum)
 Date: Nov 05, 2023  Hello Michelle Molina,  Thank you for visiting today. Here is a summary of the key instructions:  - Medications:   - Take doxycycline 100 mg once a day with dinner   - Take a probiotic in the morning, a few hours before the doxycycline   - Continue using minoxidil and clobetasol every morning   - Continue taking Viviscal and vital proteins supplements  - Lifestyle Changes:   - If wearing a hairpiece, use silk bands to avoid friction  - Follow-up:   - Return for a follow-up appointment in 4 months   - Other Instructions:   - Continue current hair care routine   - Contact the office through MyChart if any issues arise  Please reach out if you have any questions or concerns.  Warm regards,  Dr. Louana Roup, Dermatology Important Information  Due to recent changes in healthcare laws, you may see results of your pathology and/or laboratory studies on MyChart before the doctors have had a chance to review them. We understand that in some cases there may be results that are confusing or concerning to you. Please understand that not all results are received at the same time and often the doctors may need to interpret multiple results in order to provide you with the best plan of care or course of treatment. Therefore, we ask that you please give us  2 business days to thoroughly review all your results before contacting the office for clarification. Should we see a critical lab result, you will be contacted sooner.   If You Need Anything After Your Visit  If you have any questions or concerns for your doctor, please call our main line at [phone] If no one answers, please leave a voicemail as directed and we will return your call as soon as possible. Messages left after 4 pm will be answered the following business day.   You may also send us  a message via MyChart. We typically respond to MyChart messages within 1-2 business days.  For prescription refills, please ask  your pharmacy to contact our office. Our fax number is [phone].  If you have an urgent issue when the clinic is closed that cannot wait until the next business day, you can page your doctor at the number below.    Please note that while we do our best to be available for urgent issues outside of office hours, we are not available 24/7.   If you have an urgent issue and are unable to reach us , you may choose to seek medical care at your doctor's office, retail clinic, urgent care center, or emergency room.  If you have a medical emergency, please immediately call 911 or go to the emergency department. In the event of inclement weather, please call our main line at [phone] for an update on the status of any delays or closures.  Dermatology Medication Tips: Please keep the boxes that topical medications come in in order to help keep track of the instructions about where and how to use these. Pharmacies typically print the medication instructions only on the boxes and not directly on the medication tubes.   If your medication is too expensive, please contact our office at [phone] or send us  a message through MyChart.   We are unable to tell what your co-pay for medications will be in advance as this is different depending on your insurance coverage. However, we may be able to find a substitute medication at lower cost or  fill out paperwork to get insurance to cover a needed medication.   If a prior authorization is required to get your medication covered by your insurance company, please allow us  1-2 business days to complete this process.  Drug prices often vary depending on where the prescription is filled and some pharmacies may offer cheaper prices.  The website www.goodrx.com contains coupons for medications through different pharmacies. The prices here do not account for what the cost may be with help from insurance (it may be cheaper with your insurance), but the website can  give you the price if you did not use any insurance.  - You can print the associated coupon and take it with your prescription to the pharmacy.  - You may also stop by our office during regular business hours and pick up a GoodRx coupon card.  - If you need your prescription sent electronically to a different pharmacy, notify our office through [facility] or by phone at [phone]

## 2024-01-13 ENCOUNTER — Inpatient Hospital Stay: Payer: 59 | Attending: Hematology and Oncology

## 2024-01-14 NOTE — Assessment & Plan Note (Signed)
 Gastric bypass surgery   IV iron: 2017, 2019, Feraheme  1 dose 01/01/2023 (couldn't tolerate: chemical Smell , nose dryness, tingling mouth, speech difficulty) Lab review:   12/22/2019: Hemoglobin 10.2, MCV 73, RDW 17.2, platelets 172 11/30/2022: Hemoglobin 10.4, MCV 80, RDW 16.4, platelets 142 01/29/2023: Hemoglobin 12.1, MCV 78.2, iron saturation 25%, ferritin 36 07/09/2023: Hemoglobin 13.4, MCV 81.5, iron saturation 23%, ferritin 20 12/31/23: Hb 13.4   Discussion: Patient has excellent hemoglobin and iron studies.  No role of additional IV iron therapy. Recheck labs in 6 months and telephone visit after that to discuss results.

## 2024-01-15 ENCOUNTER — Inpatient Hospital Stay: Payer: 59 | Admitting: Hematology and Oncology

## 2024-01-15 DIAGNOSIS — D508 Other iron deficiency anemias: Secondary | ICD-10-CM

## 2024-01-15 NOTE — Progress Notes (Unsigned)
 I called and left messages with the patient.  No response

## 2024-01-16 ENCOUNTER — Encounter: Payer: Self-pay | Admitting: Hematology and Oncology

## 2024-03-09 ENCOUNTER — Ambulatory Visit: Admitting: Dermatology

## 2024-03-09 ENCOUNTER — Encounter: Payer: Self-pay | Admitting: Hematology and Oncology

## 2024-03-09 ENCOUNTER — Encounter: Payer: Self-pay | Admitting: Dermatology

## 2024-03-09 ENCOUNTER — Other Ambulatory Visit (HOSPITAL_COMMUNITY): Payer: Self-pay

## 2024-03-09 VITALS — BP 182/115

## 2024-03-09 DIAGNOSIS — L6612 Frontal fibrosing alopecia: Secondary | ICD-10-CM

## 2024-03-09 MED ORDER — SAFETY SEAL MISCELLANEOUS MISC
8 refills | Status: AC
Start: 2024-03-09 — End: ?

## 2024-03-09 MED ORDER — DOXYCYCLINE HYCLATE 100 MG PO TABS
100.0000 mg | ORAL_TABLET | Freq: Every day | ORAL | 1 refills | Status: AC
  Filled 2024-03-09: qty 30, 30d supply, fill #0
  Filled 2024-03-09: qty 90, 90d supply, fill #0

## 2024-03-09 NOTE — Progress Notes (Signed)
   Follow-Up Visit   Subjective  Michelle Molina is a 58 y.o. female established patient who presents for FOLLOW UP on the diagnoses listed below:  Patient was last evaluated on 11/05/23.   Frontal fibrosing alopecia : Prescribed AA Gel w/ Minoxidil 10%/Clobetasol 0.05% to apply QAM, Doxycycline  100mg  - take daily with heavy meal.  Patient reports sxs are better - pt notices hair growth in previously bare areas.   Are you nursing, pregnant or trying to conceive? No   The following portions of the chart were reviewed this encounter and updated as appropriate: medications, allergies, medical history  Review of Systems:  No other skin or systemic complaints except as noted in HPI or Assessment and Plan.  Objective  Well appearing patient in no apparent distress; mood and affect are within normal limits.   A focused examination was performed of the following areas: scalp   Relevant exam findings are noted in the Assessment and Plan.          Assessment & Plan   1. Frontal Fibrosing Alopecia - Assessment: Patient demonstrates improvement with visible baby hairs growing in the frontal hairline area. Some scarring is present behind the frontal hairline, indicating a typical pattern of frontal fibrosing alopecia. The condition is also affecting the lateral eyebrows. The patient reports noticing a difference in hair growth. Visual assessment suggests better progress than captured in photographs due to the fine nature of the new hair growth.  - Plan:    Continue doxycycline  daily    Resume topical minoxidil-clobetasol gel application in the morning    Apply to affected areas including lateral eyebrows    Continue collagen and vital proteins supplements    Patient educated on expected timeline for full appreciation of results and importance of consistent medication use  Follow-up in 6 months to assess continued response to treatment.   No follow-ups on file.   Documentation: I  have reviewed the above documentation for accuracy and completeness, and I agree with the above.  I, Shirron Maranda, CMA, am acting as scribe for [employer], DO.   Delon Lenis, DO

## 2024-03-09 NOTE — Patient Instructions (Signed)

## 2024-05-20 NOTE — Discharge Summary (Signed)
 ------------------------------------------------------------------------------- Attestation signed by Franky Carlin Courts, MD at 05/20/2024  6:38 PM I saw and evaluated the patient. I have personally examined the patient and independently reviewed the important data in the patient's chart, including but not limited to vitals, lab work, tests, and imaging studies. I have rounded with my medical team and have discussed the plan of care with my medical team and the patient. I have reviewed the resident's note and agree with the resident physician's findings and plan.    This is a late entry for the services rendered on 05/19/2024.  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  Hematuria was most likely determined to be caused by traumatic in/out catheterization.  Patient will need to follow-up with urology in the outpatient setting.  At the time of discharge, no more gross hematuria was present.  Total time spent on discharge: 23 minutes  Kevin C. Courts, MD -------------------------------------------------------------------------------  [facility] Discharge Summary  Name: MARIJA CALAMARI MRN: [id number] Age: 30 yrs DOB: [birth year]  Admission: 05/17/2024 Admitting Physician: Nancylee Bennett Seat, MD  Discharge: 05/19/2024 Discharge Physician: Franky Courts, MD    Admission Diagnoses:  Acute abdominal pain [R10.9] Hematuria [R31.9] Acute UTI [N39.0] Hematuria, unspecified type [R31.9]   Discharge Diagnoses:  Hematuria Simple cystitis.  Admission Condition: fair Discharged Condition: fair  Indication for Hospitalization and Brief Hospital Course: For full details, please see H&P, progress notes, consult notes and ancillary notes. Briefly, CAROLEANN CASLER is a 58  y.o. 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 She presented on 05/17/2024 with a chief complaint of LLQ pain and gross hematuria.  She was treated for this complaint. The details of his hospital stay are summarized below in a problem based format.   #Gross Hematuria  #Normocytic, acute blood loss anemia  #Simple cystitis Patient presented with 2 days of gross hematuria.  Her hemoglobin was 14.2 on admission.  She was not symptomatic with lightheadedness, dyspnea, or any other sources of acute blood loss.  She did endorse mild left lower quadrant pain.  Her differential for gross hematuria included structural/traumatic versus infection.  Her most likely etiology of the gross hematuria was deemed to be traumatic after recently starting self-catheterization at home secondary to neurogenic bladder.  However, she was also found to have a urinary tract infection with Klebsiella pneumoniae susceptible to Bactrim, for which antibiotics were initiated. Her stone search CT was negative.  She remained hemodynamically stable throughout hospitalization.  Urology was consulted and recommended possible cystoscopy outpatient and that they would follow-up with her in the outpatient setting.  She had a mild transaminitis on admission which improved throughout her hospitalization.  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  She was continued on prophylactic Lovenox given her comorbid GBS for DVT prophylaxis.  Her aspirin was discontinued given there was no found medical indication for the aspirin and puts her at a higher bleeding risk from a hematuria standpoint.    The patient's chronic medical conditions were treated accordingly per the patient's home medication regimen.  The patient's medication reconciliation (with changes made to chronic  medications), follow-up appointments, discharge orders, instructions and significant lab and  diagnostic studies are noted below.   On the morning of 11/25, the patient was deemed medically ready for discharge from the hospital. The hospital course and treatment plan going forward were discussed with the patient and her family, and all questions were answered.  The patient will be discharged home and was instructed to follow up as below.  She was hemodynamically stable and in no acute distress at the time of discharge.  Predictive Model Details        27.8% (High)  Factor Value   Calculated 05/19/2024 14:55 21% Number of appointments in last 90 days 81   Readmission Risk Score v2 Model 14% Number of hospitalizations in last year 3   *Archived Data 8% Braden score 17    7% Latest RDW in last 72 hrs 14.2 %    5% Number of active outpatient medication orders 19     Discharge Follow-up Action Items: Follow up visits: PCP in 1-2 weeks Please follow up on CBC to ensure stability of hemoglobin Please ensure subspecialty follow up as indicated. Please consider incidental imaging findings below. Follow-up with urology in the outpatient setting for consideration of cystoscopy/chronic neurogenic bladder management Medication changes: Started Bactrim for simple cystitis for 3 days, EOT (11/27) Follow-up aspirin 81 mg daily, no medical indication found to be on aspirin Incidental imaging findings:  CTA Abd pelvis 11/13 with hyperenhancing focus in central liver, hemangioma vs perfusion anomaly likely benign.   Disposition: Home Health Care home  Patient was seen and examined by the attending physician on day of discharge, and was deemed appropriate for discharge.    Patient's Ordered Code Status: Prior  Consults: None  Discharge Orders     Occupational Therapy Home Health Coordination     Details:    Actions: Resume Home Health   Physical Therapy Home Health Coordination     Details:     Actions: Resume Home Health       Patient's Ordered Code Status: Prior  Consults: None  CBC:  Results from last 7 days  Lab Units 05/19/24 0153 05/18/24 1228 05/18/24 0431 05/17/24 1152  WHITE BLOOD CELL COUNT 10*3/uL 4.05* 3.97* 3.56* 3.43*  HEMOGLOBIN g/dL 88.6* 86.9 88.7* 85.7  HEMATOCRIT % 34.7* 40.0 35.0* 42.9  PLATELET COUNT 10*3/uL 219 222 226 263   CMP:  Results from last 7 days  Lab Units 05/19/24 0153 05/18/24 0431 05/17/24 1152  SODIUM mmol/L 138 139 141  POTASSIUM mmol/L 4.1 4.3 4.2  CHLORIDE mmol/L 105 107 104  CO2 mmol/L 31 30 29   BUN mg/dL 20 22 20   CREATININE mg/dL 9.24 9.26 9.22  CALCIUM  mg/dL 9.0 9.0 9.9  MAGNESIUM mg/dL 1.9 1.9  --   BILIRUBIN TOTAL mg/dL 0.4 0.5 0.5  AST U/L 39 48* 54*  ALT U/L 55* 66* 77*  TOTAL PROTEIN g/dL 5.8* 5.9* 7.7  ALBUMIN g/dL 2.9* 2.9* 3.7  ANION GAP mmol/L 2* 2* 8    Disposition: Home Health Care   Patient Instructions:    Discharge Medications     New Medications      Sig Disp Refill Start End  sulfamethoxazole-trimethoprim 800-160 mg per tablet Commonly known as: BACTRIM DS  Take 1 tablet by mouth every 12 (twelve) hours for 3 days.  6 tablet  0         Medications To Continue      Sig Disp Refill Start End  acetaminophen  500 mg tablet Commonly known as: TYLENOL   Take 1 tablet (500 mg total) by  mouth every 6 (six) hours as needed for mild pain (1-3), headaches or fever 100.4 F or GREATER.   0     BisaCODYL 10 mg suppository Commonly known as: DULCOLAX  Insert 1 suppository (10 mg total) into the rectum daily as needed for constipation (Insert 1 suppository (10 mg total) into the rectum daily as needed for constipation Indications: emptying of the bowel, neurogenic bowel evacuation.)  30 suppository  0     calcium  citrate 950 mg (200 mg calcium ) Tab Commonly known as: CALCITRATE  Take 2 tablets (1,900 mg total) by mouth 3 (three) times a day. Calcium  Citrate preferred calcium  salt for you  to take as this type is better absorbed   0     docusate sodium  283 mg/5 mL Enem Commonly known as: VACUANT  Insert 5 mL (283 mg total) into the rectum daily Indications: For evacuation of neurogenic bowel.  150 mL  0     DULoxetine 60 mg capsule Commonly known as: CYMBALTA  Take 60 mg by mouth daily.   0     enoxaparin 40 mg/0.4 mL Syrg Commonly known as: LOVENOX  Inject the contents of 1 syringe (40 mg total) under the skin at bedtime. Please continue through 07/10/24 to complete a 90 day course for reduced risk of blood clots. (Inject 0.4 mL (40 mg total) under the skin at bedtime Indications: deep vein thrombosis prevention. Please continue through 07/10/24 to complete a 90 day course for reduced risk of blood clots)  25.2 mL  0     fluticasone propionate 50 mcg/spray nasal spray Commonly known as: FLONASE  Administer 1 spray into each nostril as needed for rhinitis.   0     metoprolol succinate 25 mg 24 hr tablet Commonly known as: TOPROL XL  Take 1 tablet (25 mg total) by mouth daily.  30 tablet  0     polyethylene glycol 17 gram Powd powder Commonly known as: MIRALAX   Mix 1 measured capful (17g) in 4-8 ounces of water or juice. Stir until dissolved and drink daily as directed. (Take 17 g by mouth daily Indications: emptying of the bowel.)  1530 g  0     rosuvastatin 20 mg tablet Commonly known as: CRESTOR  Take 1 tablet (20 mg total) by mouth daily. (Take 1 tablet (20 mg total) by mouth daily Indications: stroke prevention.)  90 tablet  3     Stimulant Laxative Plus 8.6-50 mg per tablet Generic drug: sennosides-docusate sodium   Take 2 tablets by mouth daily (Take 2 tablets by mouth daily Indications: Management of neurogenic bowel.)  60 tablet  0     thiamine 100 mg tablet Commonly known as: VITAMIN B1  Take 1 tablet (100 mg total) by mouth daily.  30 tablet  0     traZODone 100 mg tablet Commonly known as: DESYREL  Take 1 tablet (100 mg total) by  mouth nightly as needed for sleep.  90 tablet  1     VITAMIN B-12 ORAL  Take 1 tablet by mouth daily.   0         Stopped Medications    aspirin 81 mg chewable tablet   cefUROXime 500 mg tablet Commonly known as: CEFTIN       Unreviewed Medications      Sig Disp Refill Start End  ergocalciferol  1,250 mcg (50,000 unit) capsule Commonly known as: VITAMIN D2  Take 1 capsule (50,000 Units total) by mouth once a week.  12 capsule  1     therapeutic multivitamin Tab  Take 1 tablet by mouth daily.   0          Discharge Orders     Occupational Therapy Home Health Coordination     Details:    Actions: Resume Home Health   Physical Therapy Home Health Coordination     Details:    Actions: Resume Home Health       Follow-up  Future Appointments  Date Time Provider Department Center  06/08/2024 11:00 AM Leo James Phillips, PA-C [facility] None  06/08/2024  1:00 PM Johnnie Corp, RN [facility] None     To request medical records from this hospitalization, please refer to [email or web address] or call [phone].    [place] MICAEL Presto, MD  PGY-2 [facility]   This document was created using the aid of voice recognition Dragon dictation software, please excuse any sound alike substitutions, typographic, or transcription errors. Efforts have been made to correct these errors, however, some may persist, and this does not reflect the standard of medical care. If there are any questions please do not hesitate to contact me for clarification.

## 2024-05-27 ENCOUNTER — Telehealth: Payer: Self-pay | Admitting: *Deleted

## 2024-05-27 NOTE — Telephone Encounter (Signed)
 Received call from pt stating she was recently hospitalized for a spinal stroke.  Pt sates she was alerted by her neurologist Dr. Charlyne with [facility] that she needed to f/u with hematology.  Appt scheduled, pt notified and verbalized understanding.  RN also requested that pt have Dr. Charlyne fax over office notes for MD review. Pt verbalized understanding.

## 2024-06-04 ENCOUNTER — Inpatient Hospital Stay: Attending: Hematology and Oncology | Admitting: Hematology and Oncology

## 2024-06-04 VITALS — BP 126/80 | HR 74 | Temp 98.3°F | Resp 18

## 2024-06-04 DIAGNOSIS — R531 Weakness: Secondary | ICD-10-CM | POA: Insufficient documentation

## 2024-06-04 DIAGNOSIS — D508 Other iron deficiency anemias: Secondary | ICD-10-CM | POA: Diagnosis not present

## 2024-06-04 NOTE — Assessment & Plan Note (Signed)
 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  No role of additional IV iron therapy. Recheck labs in 6 months and telephone visit after that to discuss results.

## 2024-06-04 NOTE — Progress Notes (Signed)
 Patient Care Team: Latisha Ronal Crank, PA-C as PCP - General (Physician Assistant) Patel, Donika K, DO as Consulting Physician (Neurology)  DIAGNOSIS:  Encounter Diagnosis  Name Primary?   Other iron deficiency anemia Yes    CHIEF COMPLIANT: Follow-up after recent hospitalization  HISTORY OF PRESENT ILLNESS:  History of Present Illness Michelle Molina is a 58 year old female with iron deficiency anemia who presents for hematology/oncology follow-up regarding iron deficiency in the context of recent acute spinal cord injury and ongoing anticoagulation.  On April 09, 2024, she had abrupt onset of severe bilateral lower extremity symptoms, starting as left calf cramping and progressing within 15-25 minutes to burning leg pain, bilateral leg weakness, inability to ambulate, and complete loss of sensation from the knees down. She has had no leg movement, no toe movement, and ongoing bowel and bladder incontinence without improvement since onset, with no preceding trauma, fall, or illness.  She was hospitalized for about one month and had nerve conduction studies, thoracic and lumbar spine MRI with and without contrast, and a hypercoagulability workup, all negative. She received two steroid courses and a five-day IVIG course without benefit. Lumbar puncture was attempted but unsuccessful. She was then transferred to inpatient rehabilitation for daily physical therapy but has not regained function. She continues neurology follow-up and remains on nightly enoxaparin, planned through July 10, 2024.  Since the initial event she has persistent left leg swelling with intermittent palpable knots and no ecchymosis. She notes that blood in syringes clots quickly during blood draws. She retains full upper extremity function and can transfer and perform self-care in bed but cannot stand, needs help with cooking and other activities, and now uses a hospital bed on the first floor due to inability to  use stairs.  She previously had adverse reactions to iron infusions, including a strong alcohol -like odor, altered taste, pruritus, and a rash on the arm after the second or third infusion, which resolved. She has not required iron infusions since July 2024.     ALLERGIES:  is allergic to feraheme  [ferumoxytol ], ibuprofen, aspirin, percocet [oxycodone-acetaminophen ], and tramadol  hcl.  MEDICATIONS:  Current Outpatient Medications  Medication Sig Dispense Refill   doxycycline  (VIBRA -TABS) 100 MG tablet Take 1 tablet (100 mg total) by mouth daily. 90 tablet 1   DULoxetine (CYMBALTA) 30 MG capsule Take 30 mg by mouth in the morning and at bedtime.     HYDROcodone -acetaminophen  (NORCO) 10-325 MG tablet TAKE 1/2 TABLET BY MOUTH EVERY 4 HOURS FOR 7 DAYS AS NEEDED FOR PAIN     metoprolol succinate (TOPROL-XL) 25 MG 24 hr tablet Take 25 mg by mouth daily.     Safety Seal Miscellaneous MISC Apply qam to affected areas on scalp and eyebrows 30 g 8   No current facility-administered medications for this visit.    PHYSICAL EXAMINATION: ECOG PERFORMANCE STATUS: 1 - Symptomatic but completely ambulatory  Vitals:   06/04/24 1452  BP: 126/80  Pulse: 74  Resp: 18  Temp: 98.3 F (36.8 C)  SpO2: 100%   Filed Weights    Physical Exam Bilateral lower extremity weakness left greater than right, numbness below the knee level, lack of sensation and lack of temperature sense as well  (exam performed in the presence of a chaperone)  LABORATORY DATA:  I have reviewed the data as listed    Latest Ref Rng & Units 09/20/2023    3:38 AM 09/20/2023    3:01 AM 11/29/2022    3:53 AM  CMP  Glucose  70 - 99 mg/dL 97  891  846   BUN 6 - 20 mg/dL 15  20  16    Creatinine 0.44 - 1.00 mg/dL 9.11  8.99  9.17   Sodium 135 - 145 mmol/L 138  136  138   Potassium 3.5 - 5.1 mmol/L 4.8  6.4  4.3   Chloride 98 - 111 mmol/L 107  106  108   CO2 22 - 32 mmol/L 20   21   Calcium  8.9 - 10.3 mg/dL 8.9   8.3   Total  Protein 6.5 - 8.1 g/dL 5.5     Total Bilirubin 0.0 - 1.2 mg/dL 1.4     Alkaline Phos 38 - 126 U/L 105     AST 15 - 41 U/L 88     ALT 0 - 44 U/L 42       Lab Results  Component Value Date   WBC 3.4 (L) 09/20/2023   HGB 13.0 09/20/2023   HCT 39.5 09/20/2023   MCV 83.3 09/20/2023   PLT 180 09/20/2023   NEUTROABS 1.3 (L) 09/20/2023    ASSESSMENT & PLAN:  Iron deficiency anemia Gastric bypass surgery   IV iron: 2017, 2019, Feraheme  1 dose 01/01/2023 (couldn't tolerate: chemical Smell , nose dryness, tingling mouth, speech difficulty) Lab review:   12/22/2019: Hemoglobin 10.2, MCV 73, RDW 17.2, platelets 172 11/30/2022: Hemoglobin 10.4, MCV 80, RDW 16.4, platelets 142 01/29/2023: Hemoglobin 12.1, MCV 78.2, iron saturation 25%, ferritin 36 07/09/2023: Hemoglobin 13.4, MCV 81.5, iron saturation 23%, ferritin 20 05/19/2024: Hemoglobin 11.3, MCV 79.7 (hospitalized at [facility] for 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  She will discuss this with her neurologist. Continue with anticoagulation indefinitely.  Lack of improvement of symptoms and spite of recent hospitalization and treatments Return to clinic on an as-needed basis.    No orders of the defined types were placed in this encounter.  The patient has a good understanding of the overall plan. she agrees with it. she will call with any problems that may develop before the next visit here.  I personally spent a total of 30 minutes in the care of the patient today including preparing to see the patient, getting/reviewing separately obtained history, performing a medically appropriate exam/evaluation, counseling and educating, placing orders, referring and communicating with other health care  professionals, documenting clinical information in the EHR, independently interpreting results, communicating results, and coordinating care.   Viinay K Jersey Ravenscroft, MD 06/04/2024

## 2024-09-07 ENCOUNTER — Ambulatory Visit: Admitting: Dermatology
